# Patient Record
Sex: Male | Born: 1960
Health system: Southern US, Community
[De-identification: ages and names within clinical notes are randomized; demographics above are authoritative.]

## PROBLEM LIST (undated history)

## (undated) DIAGNOSIS — I10 Essential (primary) hypertension: Secondary | ICD-10-CM

## (undated) DIAGNOSIS — I714 Abdominal aortic aneurysm, without rupture, unspecified: Secondary | ICD-10-CM

## (undated) DIAGNOSIS — S81019A Laceration without foreign body, unspecified knee, initial encounter: Secondary | ICD-10-CM

## (undated) DIAGNOSIS — S2249XA Multiple fractures of ribs, unspecified side, initial encounter for closed fracture: Secondary | ICD-10-CM

## (undated) DIAGNOSIS — I509 Heart failure, unspecified: Secondary | ICD-10-CM

## (undated) DIAGNOSIS — I712 Thoracic aortic aneurysm, without rupture, unspecified: Secondary | ICD-10-CM

## (undated) DIAGNOSIS — J189 Pneumonia, unspecified organism: Secondary | ICD-10-CM

## (undated) DIAGNOSIS — S060X9A Concussion with loss of consciousness of unspecified duration, initial encounter: Secondary | ICD-10-CM

## (undated) DIAGNOSIS — S129XXA Fracture of neck, unspecified, initial encounter: Secondary | ICD-10-CM

## (undated) DIAGNOSIS — S22019A Unspecified fracture of first thoracic vertebra, initial encounter for closed fracture: Secondary | ICD-10-CM

## (undated) DIAGNOSIS — S0181XA Laceration without foreign body of other part of head, initial encounter: Secondary | ICD-10-CM

## (undated) DIAGNOSIS — I639 Cerebral infarction, unspecified: Secondary | ICD-10-CM

## (undated) HISTORY — PX: CARPAL TUNNEL RELEASE: SHX101

## (undated) HISTORY — DX: Laceration without foreign body, unspecified knee, initial encounter: S81.019A

## (undated) HISTORY — DX: Fracture of neck, unspecified, initial encounter: S12.9XXA

## (undated) HISTORY — DX: Laceration without foreign body of other part of head, initial encounter: S01.81XA

## (undated) HISTORY — DX: Concussion with loss of consciousness of unspecified duration, initial encounter: S06.0X9A

## (undated) HISTORY — PX: TONSILLECTOMY: SHX5217

## (undated) HISTORY — DX: Thoracic aortic aneurysm, without rupture, unspecified: I71.20

## (undated) HISTORY — DX: Thoracic aortic aneurysm, without rupture: I71.2

## (undated) HISTORY — DX: Multiple fractures of ribs, unspecified side, initial encounter for closed fracture: S22.49XA

## (undated) HISTORY — DX: Unspecified fracture of first thoracic vertebra, initial encounter for closed fracture: S22.019A

## (undated) HISTORY — PX: OTHER SURGICAL HISTORY: SHX169

---

## 1999-05-07 ENCOUNTER — Other Ambulatory Visit (HOSPITAL_COMMUNITY): Admission: RE | Admit: 1999-05-07 | Discharge: 1999-06-18 | Payer: Self-pay | Admitting: Psychiatry

## 2000-09-23 ENCOUNTER — Emergency Department (HOSPITAL_COMMUNITY): Admission: EM | Admit: 2000-09-23 | Discharge: 2000-09-23 | Payer: Self-pay

## 2001-02-17 ENCOUNTER — Ambulatory Visit (HOSPITAL_COMMUNITY): Admission: RE | Admit: 2001-02-17 | Discharge: 2001-02-17 | Payer: Self-pay | Admitting: *Deleted

## 2001-02-18 ENCOUNTER — Encounter: Payer: Self-pay | Admitting: *Deleted

## 2002-11-20 ENCOUNTER — Emergency Department (HOSPITAL_COMMUNITY): Admission: EM | Admit: 2002-11-20 | Discharge: 2002-11-20 | Payer: Self-pay | Admitting: Emergency Medicine

## 2002-11-22 ENCOUNTER — Emergency Department (HOSPITAL_COMMUNITY): Admission: EM | Admit: 2002-11-22 | Discharge: 2002-11-22 | Payer: Self-pay | Admitting: Emergency Medicine

## 2008-03-15 ENCOUNTER — Emergency Department (HOSPITAL_BASED_OUTPATIENT_CLINIC_OR_DEPARTMENT_OTHER): Admission: EM | Admit: 2008-03-15 | Discharge: 2008-03-15 | Payer: Self-pay | Admitting: Emergency Medicine

## 2010-12-21 ENCOUNTER — Emergency Department (HOSPITAL_COMMUNITY): Payer: PRIVATE HEALTH INSURANCE

## 2010-12-21 ENCOUNTER — Encounter (HOSPITAL_COMMUNITY): Payer: Self-pay | Admitting: Radiology

## 2010-12-21 ENCOUNTER — Inpatient Hospital Stay (HOSPITAL_COMMUNITY)
Admission: EM | Admit: 2010-12-21 | Discharge: 2010-12-25 | DRG: 089 | Disposition: A | Discharge status: Home / Self Care | Payer: PRIVATE HEALTH INSURANCE | Attending: Surgery | Admitting: Surgery

## 2010-12-21 DIAGNOSIS — S0100XA Unspecified open wound of scalp, initial encounter: Secondary | ICD-10-CM | POA: Diagnosis present

## 2010-12-21 DIAGNOSIS — I712 Thoracic aortic aneurysm, without rupture, unspecified: Secondary | ICD-10-CM | POA: Diagnosis present

## 2010-12-21 DIAGNOSIS — S2249XA Multiple fractures of ribs, unspecified side, initial encounter for closed fracture: Secondary | ICD-10-CM

## 2010-12-21 DIAGNOSIS — S060X9A Concussion with loss of consciousness of unspecified duration, initial encounter: Secondary | ICD-10-CM

## 2010-12-21 DIAGNOSIS — F1010 Alcohol abuse, uncomplicated: Secondary | ICD-10-CM | POA: Diagnosis present

## 2010-12-21 DIAGNOSIS — S91009A Unspecified open wound, unspecified ankle, initial encounter: Secondary | ICD-10-CM

## 2010-12-21 DIAGNOSIS — F3289 Other specified depressive episodes: Secondary | ICD-10-CM | POA: Diagnosis present

## 2010-12-21 DIAGNOSIS — S81009A Unspecified open wound, unspecified knee, initial encounter: Secondary | ICD-10-CM | POA: Diagnosis present

## 2010-12-21 DIAGNOSIS — S12000A Unspecified displaced fracture of first cervical vertebra, initial encounter for closed fracture: Secondary | ICD-10-CM | POA: Diagnosis present

## 2010-12-21 DIAGNOSIS — S0180XA Unspecified open wound of other part of head, initial encounter: Secondary | ICD-10-CM | POA: Diagnosis present

## 2010-12-21 DIAGNOSIS — F329 Major depressive disorder, single episode, unspecified: Secondary | ICD-10-CM | POA: Diagnosis present

## 2010-12-21 DIAGNOSIS — S060XAA Concussion with loss of consciousness status unknown, initial encounter: Secondary | ICD-10-CM

## 2010-12-21 DIAGNOSIS — S81809A Unspecified open wound, unspecified lower leg, initial encounter: Secondary | ICD-10-CM

## 2010-12-21 DIAGNOSIS — S22009A Unspecified fracture of unspecified thoracic vertebra, initial encounter for closed fracture: Secondary | ICD-10-CM | POA: Diagnosis present

## 2010-12-21 DIAGNOSIS — I10 Essential (primary) hypertension: Secondary | ICD-10-CM | POA: Diagnosis present

## 2010-12-21 HISTORY — DX: Essential (primary) hypertension: I10

## 2010-12-21 HISTORY — PX: OTHER SURGICAL HISTORY: SHX169

## 2010-12-21 LAB — TYPE AND SCREEN
Antibody Screen: NEGATIVE
Unit division: 0
Unit division: 0

## 2010-12-21 LAB — CBC
MCV: 83 fL (ref 78.0–100.0)
Platelets: 218 10*3/uL (ref 150–400)
RDW: 12.5 % (ref 11.5–15.5)
WBC: 7.2 10*3/uL (ref 4.0–10.5)

## 2010-12-21 LAB — COMPREHENSIVE METABOLIC PANEL
Albumin: 3.9 g/dL (ref 3.5–5.2)
BUN: 11 mg/dL (ref 6–23)
CO2: 26 mEq/L (ref 19–32)
Chloride: 102 mEq/L (ref 96–112)
Creatinine, Ser: 1.12 mg/dL (ref 0.50–1.35)
GFR calc non Af Amer: >60 mL/min (ref >60)
Total Bilirubin: 0.7 mg/dL (ref 0.3–1.2)

## 2010-12-21 LAB — ETHANOL: Alcohol, Ethyl (B): 87 mg/dL — ABNORMAL HIGH (ref 0–11)

## 2010-12-21 LAB — ABO/RH: ABO/RH(D): B POS

## 2010-12-21 LAB — POCT I-STAT, CHEM 8
BUN: 11 mg/dL (ref 6–23)
Calcium, Ion: 1.11 mmol/L — ABNORMAL LOW (ref 1.12–1.32)
Hemoglobin: 15 g/dL (ref 13.0–17.0)
TCO2: 25 mmol/L (ref 0–100)

## 2010-12-21 LAB — MRSA PCR SCREENING: MRSA by PCR: NEGATIVE

## 2010-12-21 MED ORDER — IOHEXOL 300 MG/ML  SOLN
120.0000 mL | Freq: Once | INTRAMUSCULAR | Status: AC | PRN
  Administered 2010-12-21: 120 mL via INTRAVENOUS

## 2010-12-22 ENCOUNTER — Inpatient Hospital Stay (HOSPITAL_COMMUNITY): Payer: PRIVATE HEALTH INSURANCE

## 2010-12-22 LAB — CBC
MCHC: 33.1 g/dL (ref 30.0–36.0)
Platelets: 152 10*3/uL (ref 150–400)
RDW: 13 % (ref 11.5–15.5)

## 2010-12-22 LAB — BASIC METABOLIC PANEL
GFR calc Af Amer: >60 mL/min (ref >60)
GFR calc non Af Amer: >60 mL/min (ref >60)
Potassium: 3.5 mEq/L (ref 3.5–5.1)
Sodium: 138 mEq/L (ref 135–145)

## 2010-12-23 LAB — BASIC METABOLIC PANEL
Chloride: 103 mEq/L (ref 96–112)
GFR calc Af Amer: >60 mL/min (ref >60)
Potassium: 3.8 mEq/L (ref 3.5–5.1)
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
## 2011-01-02 ENCOUNTER — Encounter (INDEPENDENT_AMBULATORY_CARE_PROVIDER_SITE_OTHER): Payer: Self-pay
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
## 2011-01-07 ENCOUNTER — Encounter: Payer: Self-pay | Admitting: Surgery

## 2011-01-08 ENCOUNTER — Telehealth (INDEPENDENT_AMBULATORY_CARE_PROVIDER_SITE_OTHER): Payer: Self-pay | Admitting: Orthopedic Surgery

## 2011-01-08 NOTE — Telephone Encounter (Signed)
Pt called to reschedule appointment. Told him to come 01/09/11 at 2:00.

## 2011-01-09 ENCOUNTER — Encounter (INDEPENDENT_AMBULATORY_CARE_PROVIDER_SITE_OTHER): Payer: Self-pay

## 2011-01-09 ENCOUNTER — Ambulatory Visit (INDEPENDENT_AMBULATORY_CARE_PROVIDER_SITE_OTHER): Payer: Self-pay | Admitting: Orthopedic Surgery

## 2011-01-09 DIAGNOSIS — S81012A Laceration without foreign body, left knee, initial encounter: Secondary | ICD-10-CM

## 2011-01-09 DIAGNOSIS — S0191XA Laceration without foreign body of unspecified part of head, initial encounter: Secondary | ICD-10-CM

## 2011-01-09 DIAGNOSIS — S81009A Unspecified open wound, unspecified knee, initial encounter: Secondary | ICD-10-CM

## 2011-01-09 DIAGNOSIS — S0180XA Unspecified open wound of other part of head, initial encounter: Secondary | ICD-10-CM

## 2011-01-09 MED ORDER — DOXYCYCLINE HYCLATE 100 MG PO TABS: 100.0000 mg | ORAL_TABLET | Freq: Two times a day (BID) | ORAL | Status: AC
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
## 2011-01-09 NOTE — Patient Instructions (Signed)
Sunscreen (>35 SPF) to scars daily for 1 year.  May use Mederma if desired.  After six months, if not happy with appearance, we can arrange evaluation by a plastic surgeon for a scar revision.   Call next week if painful part of facial wound is not improved.
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
## 2011-02-03 DIAGNOSIS — S129XXA Fracture of neck, unspecified, initial encounter: Secondary | ICD-10-CM | POA: Insufficient documentation

## 2011-02-03 DIAGNOSIS — S22019A Unspecified fracture of first thoracic vertebra, initial encounter for closed fracture: Secondary | ICD-10-CM

## 2011-02-03 DIAGNOSIS — S060X9A Concussion with loss of consciousness of unspecified duration, initial encounter: Secondary | ICD-10-CM

## 2011-02-03 DIAGNOSIS — S0181XA Laceration without foreign body of other part of head, initial encounter: Secondary | ICD-10-CM

## 2011-02-03 DIAGNOSIS — S2249XA Multiple fractures of ribs, unspecified side, initial encounter for closed fracture: Secondary | ICD-10-CM | POA: Insufficient documentation

## 2011-02-03 DIAGNOSIS — S81019A Laceration without foreign body, unspecified knee, initial encounter: Secondary | ICD-10-CM | POA: Insufficient documentation

## 2011-02-03 DIAGNOSIS — I712 Thoracic aortic aneurysm, without rupture: Secondary | ICD-10-CM | POA: Insufficient documentation

## 2011-02-03 DIAGNOSIS — I10 Essential (primary) hypertension: Secondary | ICD-10-CM | POA: Insufficient documentation

## 2011-02-04 ENCOUNTER — Institutional Professional Consult (permissible substitution) (INDEPENDENT_AMBULATORY_CARE_PROVIDER_SITE_OTHER): Payer: Self-pay | Admitting: Surgery

## 2011-02-04 ENCOUNTER — Encounter: Payer: Self-pay | Admitting: Surgery

## 2011-02-04 VITALS — BP 113/80 | HR 91 | Resp 20 | Ht 75.0 in | Wt 265.0 lb

## 2011-02-04 DIAGNOSIS — IMO0001 Reserved for inherently not codable concepts without codable children: Secondary | ICD-10-CM

## 2011-02-04 DIAGNOSIS — I712 Thoracic aortic aneurysm, without rupture: Secondary | ICD-10-CM

## 2011-02-04 MED ORDER — METOPROLOL SUCCINATE ER 25 MG PO TB24: 25.0000 mg | ORAL_TABLET | Freq: Every day | ORAL | Status: DC
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
## 2011-12-22 ENCOUNTER — Other Ambulatory Visit: Payer: Self-pay | Admitting: Surgery

## 2011-12-22 DIAGNOSIS — I712 Thoracic aortic aneurysm, without rupture: Secondary | ICD-10-CM

## 2011-12-24 ENCOUNTER — Other Ambulatory Visit: Payer: Self-pay | Admitting: Surgery

## 2011-12-24 DIAGNOSIS — I712 Thoracic aortic aneurysm, without rupture: Secondary | ICD-10-CM

## 2012-01-19 ENCOUNTER — Other Ambulatory Visit: Payer: PRIVATE HEALTH INSURANCE

## 2012-01-20 ENCOUNTER — Ambulatory Visit: Payer: PRIVATE HEALTH INSURANCE | Admitting: Surgery

## 2012-02-03 ENCOUNTER — Ambulatory Visit: Payer: PRIVATE HEALTH INSURANCE | Admitting: Surgery

## 2012-11-17 ENCOUNTER — Emergency Department (HOSPITAL_COMMUNITY): Payer: PRIVATE HEALTH INSURANCE

## 2012-11-17 ENCOUNTER — Emergency Department (HOSPITAL_COMMUNITY)
Admission: EM | Admit: 2012-11-17 | Discharge: 2012-11-17 | Disposition: A | Discharge status: Home / Self Care | Payer: PRIVATE HEALTH INSURANCE | Attending: Emergency Medicine | Admitting: Emergency Medicine

## 2012-11-17 ENCOUNTER — Encounter (HOSPITAL_COMMUNITY): Payer: Self-pay | Admitting: *Deleted

## 2012-11-17 DIAGNOSIS — R4182 Altered mental status, unspecified: Secondary | ICD-10-CM

## 2012-11-17 DIAGNOSIS — Z8781 Personal history of (healed) traumatic fracture: Secondary | ICD-10-CM | POA: Insufficient documentation

## 2012-11-17 DIAGNOSIS — F329 Major depressive disorder, single episode, unspecified: Secondary | ICD-10-CM | POA: Insufficient documentation

## 2012-11-17 DIAGNOSIS — Z87828 Personal history of other (healed) physical injury and trauma: Secondary | ICD-10-CM | POA: Insufficient documentation

## 2012-11-17 DIAGNOSIS — Z79899 Other long term (current) drug therapy: Secondary | ICD-10-CM | POA: Insufficient documentation

## 2012-11-17 DIAGNOSIS — I10 Essential (primary) hypertension: Secondary | ICD-10-CM | POA: Insufficient documentation

## 2012-11-17 DIAGNOSIS — Z8679 Personal history of other diseases of the circulatory system: Secondary | ICD-10-CM | POA: Insufficient documentation

## 2012-11-17 DIAGNOSIS — F3289 Other specified depressive episodes: Secondary | ICD-10-CM | POA: Insufficient documentation

## 2012-11-17 DIAGNOSIS — Z87820 Personal history of traumatic brain injury: Secondary | ICD-10-CM | POA: Insufficient documentation

## 2012-11-17 LAB — POCT I-STAT, CHEM 8
BUN: 14 mg/dL (ref 6–23)
Calcium, Ion: 1.15 mmol/L (ref 1.12–1.23)
Chloride: 106 mEq/L (ref 96–112)
Creatinine, Ser: 1.3 mg/dL (ref 0.50–1.35)
Glucose, Bld: 113 mg/dL — ABNORMAL HIGH (ref 70–99)
HCT: 42 % (ref 39.0–52.0)
Potassium: 3.2 mEq/L — ABNORMAL LOW (ref 3.5–5.1)
Sodium: 142 mEq/L (ref 135–145)

## 2012-11-17 MED ORDER — ONDANSETRON HCL 4 MG/2ML IJ SOLN
INTRAMUSCULAR | Status: AC
  Administered 2012-11-17: 4 mg
  Filled 2012-11-17: qty 2

## 2012-11-17 NOTE — ED Notes (Signed)
Pt discharged home with bus pass. Has no further questions at the time. Vital signs stable.

## 2012-11-17 NOTE — ED Notes (Signed)
Patient transported to CT 

## 2012-11-17 NOTE — ED Notes (Signed)
Pt given [place] sandwich and drink. He is alert and oriented x4. Vital signs stable. To be discharged home.
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
## 2012-11-17 NOTE — ED Notes (Signed)
Pt returned from CT °

## 2012-11-17 NOTE — ED Notes (Signed)
MD at bedside. 
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
## 2015-06-10 DIAGNOSIS — I639 Cerebral infarction, unspecified: Secondary | ICD-10-CM

## 2015-06-10 HISTORY — DX: Cerebral infarction, unspecified: I63.9

## 2016-12-23 ENCOUNTER — Inpatient Hospital Stay (HOSPITAL_COMMUNITY)
Admission: EM | Admit: 2016-12-23 | Discharge: 2016-12-25 | DRG: 065 | Disposition: A | Discharge status: Home / Self Care | Payer: Self-pay | Attending: Internal Medicine | Admitting: Internal Medicine

## 2016-12-23 ENCOUNTER — Emergency Department (HOSPITAL_COMMUNITY): Payer: Self-pay

## 2016-12-23 ENCOUNTER — Encounter (HOSPITAL_COMMUNITY): Payer: Self-pay | Admitting: Family Medicine

## 2016-12-23 DIAGNOSIS — I482 Chronic atrial fibrillation: Secondary | ICD-10-CM | POA: Diagnosis present

## 2016-12-23 DIAGNOSIS — I639 Cerebral infarction, unspecified: Principal | ICD-10-CM | POA: Diagnosis present

## 2016-12-23 DIAGNOSIS — E785 Hyperlipidemia, unspecified: Secondary | ICD-10-CM | POA: Diagnosis present

## 2016-12-23 DIAGNOSIS — Q231 Congenital insufficiency of aortic valve: Secondary | ICD-10-CM

## 2016-12-23 DIAGNOSIS — I10 Essential (primary) hypertension: Secondary | ICD-10-CM | POA: Diagnosis present

## 2016-12-23 DIAGNOSIS — I712 Thoracic aortic aneurysm, without rupture: Secondary | ICD-10-CM | POA: Diagnosis present

## 2016-12-23 DIAGNOSIS — R29702 NIHSS score 2: Secondary | ICD-10-CM | POA: Diagnosis present

## 2016-12-23 DIAGNOSIS — R4702 Dysphasia: Secondary | ICD-10-CM | POA: Diagnosis present

## 2016-12-23 DIAGNOSIS — I69320 Aphasia following cerebral infarction: Secondary | ICD-10-CM

## 2016-12-23 DIAGNOSIS — I110 Hypertensive heart disease with heart failure: Secondary | ICD-10-CM | POA: Diagnosis present

## 2016-12-23 DIAGNOSIS — I7121 Aneurysm of the ascending aorta, without rupture: Secondary | ICD-10-CM | POA: Diagnosis present

## 2016-12-23 DIAGNOSIS — Z7901 Long term (current) use of anticoagulants: Secondary | ICD-10-CM

## 2016-12-23 DIAGNOSIS — Z833 Family history of diabetes mellitus: Secondary | ICD-10-CM

## 2016-12-23 DIAGNOSIS — I5042 Chronic combined systolic (congestive) and diastolic (congestive) heart failure: Secondary | ICD-10-CM

## 2016-12-23 DIAGNOSIS — Z8249 Family history of ischemic heart disease and other diseases of the circulatory system: Secondary | ICD-10-CM

## 2016-12-23 DIAGNOSIS — I481 Persistent atrial fibrillation: Secondary | ICD-10-CM | POA: Diagnosis present

## 2016-12-23 DIAGNOSIS — I420 Dilated cardiomyopathy: Secondary | ICD-10-CM | POA: Diagnosis present

## 2016-12-23 DIAGNOSIS — G8321 Monoplegia of upper limb affecting right dominant side: Secondary | ICD-10-CM | POA: Diagnosis present

## 2016-12-23 DIAGNOSIS — Z79899 Other long term (current) drug therapy: Secondary | ICD-10-CM

## 2016-12-23 DIAGNOSIS — I509 Heart failure, unspecified: Secondary | ICD-10-CM | POA: Diagnosis present

## 2016-12-23 DIAGNOSIS — E876 Hypokalemia: Secondary | ICD-10-CM | POA: Diagnosis present

## 2016-12-23 DIAGNOSIS — I4819 Other persistent atrial fibrillation: Secondary | ICD-10-CM | POA: Diagnosis present

## 2016-12-23 DIAGNOSIS — I5022 Chronic systolic (congestive) heart failure: Secondary | ICD-10-CM

## 2016-12-23 DIAGNOSIS — F909 Attention-deficit hyperactivity disorder, unspecified type: Secondary | ICD-10-CM | POA: Diagnosis present

## 2016-12-23 HISTORY — DX: Cerebral infarction, unspecified: I63.9

## 2016-12-23 HISTORY — DX: Heart failure, unspecified: I50.9

## 2016-12-23 HISTORY — DX: Pneumonia, unspecified organism: J18.9

## 2016-12-23 LAB — COMPREHENSIVE METABOLIC PANEL
ALK PHOS: 48 U/L (ref 38–126)
ALT: 40 U/L (ref 17–63)
AST: 33 U/L (ref 15–41)
Albumin: 4.1 g/dL (ref 3.5–5.0)
Anion gap: 8 (ref 5–15)
BUN: 18 mg/dL (ref 6–20)
CO2: 28 mmol/L (ref 22–32)
CREATININE: 1.08 mg/dL (ref 0.61–1.24)
Calcium: 9.2 mg/dL (ref 8.9–10.3)
Chloride: 105 mmol/L (ref 101–111)
GFR calc non Af Amer: >60 mL/min (ref >60)
Glucose, Bld: 93 mg/dL (ref 65–99)
Potassium: 3.7 mmol/L (ref 3.5–5.1)
SODIUM: 141 mmol/L (ref 135–145)
Total Bilirubin: 1.5 mg/dL — ABNORMAL HIGH (ref 0.3–1.2)
Total Protein: 6.8 g/dL (ref 6.5–8.1)

## 2016-12-23 LAB — CBC
HEMATOCRIT: 43.3 % (ref 39.0–52.0)
Hemoglobin: 15.1 g/dL (ref 13.0–17.0)
MCH: 28.6 pg (ref 26.0–34.0)
MCHC: 34.9 g/dL (ref 30.0–36.0)
MCV: 82 fL (ref 78.0–100.0)
PLATELETS: 192 10*3/uL (ref 150–400)
RBC: 5.28 MIL/uL (ref 4.22–5.81)
RDW: 13.3 % (ref 11.5–15.5)
WBC: 6.8 10*3/uL (ref 4.0–10.5)

## 2016-12-23 LAB — RAPID URINE DRUG SCREEN, HOSP PERFORMED
AMPHETAMINES: NOT DETECTED
Barbiturates: NOT DETECTED
Benzodiazepines: NOT DETECTED
Cocaine: NOT DETECTED
OPIATES: NOT DETECTED
Tetrahydrocannabinol: POSITIVE — AB

## 2016-12-23 LAB — URINALYSIS, ROUTINE W REFLEX MICROSCOPIC
BACTERIA UA: NONE SEEN
BILIRUBIN URINE: NEGATIVE
GLUCOSE, UA: NEGATIVE mg/dL
HGB URINE DIPSTICK: NEGATIVE
KETONES UR: NEGATIVE mg/dL
NITRITE: NEGATIVE
Protein, ur: 30 mg/dL — AB
Specific Gravity, Urine: 1.026 (ref 1.005–1.030)
pH: 8 (ref 5.0–8.0)

## 2016-12-23 LAB — DIFFERENTIAL
Basophils Absolute: 0.1 10*3/uL (ref 0.0–0.1)
Basophils Relative: 1 %
Eosinophils Absolute: 0.2 10*3/uL (ref 0.0–0.7)
Eosinophils Relative: 3 %
LYMPHS PCT: 48 %
Lymphs Abs: 3.3 10*3/uL (ref 0.7–4.0)
MONO ABS: 0.5 10*3/uL (ref 0.1–1.0)
MONOS PCT: 8 %
NEUTROS ABS: 2.7 10*3/uL (ref 1.7–7.7)
Neutrophils Relative %: 40 %

## 2016-12-23 LAB — PROTIME-INR
INR: 1.2
Prothrombin Time: 15.3 seconds — ABNORMAL HIGH (ref 11.4–15.2)

## 2016-12-23 LAB — APTT: aPTT: 31 seconds (ref 24–36)

## 2016-12-23 MED ORDER — ASPIRIN 81 MG PO CHEW
324.0000 mg | CHEWABLE_TABLET | Freq: Once | ORAL | Status: AC
  Administered 2016-12-24: 324 mg via ORAL
  Filled 2016-12-23: qty 4

## 2016-12-23 NOTE — ED Triage Notes (Signed)
Patient reports he started experiencing right arm numbness and was unable to speak at 19:20 this afternoon while watching television. During triage, pt is able to speak but reports his speech is slurred and is able to move right arm.

## 2016-12-24 ENCOUNTER — Observation Stay (HOSPITAL_COMMUNITY): Payer: Self-pay

## 2016-12-24 ENCOUNTER — Observation Stay (HOSPITAL_COMMUNITY): Payer: PRIVATE HEALTH INSURANCE

## 2016-12-24 ENCOUNTER — Inpatient Hospital Stay (HOSPITAL_COMMUNITY): Payer: Self-pay

## 2016-12-24 ENCOUNTER — Encounter (HOSPITAL_COMMUNITY): Payer: Self-pay | Admitting: Internal Medicine

## 2016-12-24 DIAGNOSIS — I63412 Cerebral infarction due to embolism of left middle cerebral artery: Secondary | ICD-10-CM

## 2016-12-24 DIAGNOSIS — I639 Cerebral infarction, unspecified: Secondary | ICD-10-CM

## 2016-12-24 DIAGNOSIS — I4819 Other persistent atrial fibrillation: Secondary | ICD-10-CM | POA: Diagnosis present

## 2016-12-24 DIAGNOSIS — I481 Persistent atrial fibrillation: Secondary | ICD-10-CM

## 2016-12-24 LAB — COMPREHENSIVE METABOLIC PANEL
ALBUMIN: 3.6 g/dL (ref 3.5–5.0)
ALT: 35 U/L (ref 17–63)
ANION GAP: 6 (ref 5–15)
AST: 30 U/L (ref 15–41)
Alkaline Phosphatase: 44 U/L (ref 38–126)
BUN: 14 mg/dL (ref 6–20)
CHLORIDE: 106 mmol/L (ref 101–111)
CO2: 26 mmol/L (ref 22–32)
Calcium: 8.8 mg/dL — ABNORMAL LOW (ref 8.9–10.3)
Creatinine, Ser: 0.99 mg/dL (ref 0.61–1.24)
GFR calc non Af Amer: >60 mL/min (ref >60)
GLUCOSE: 91 mg/dL (ref 65–99)
POTASSIUM: 3.4 mmol/L — AB (ref 3.5–5.1)
SODIUM: 138 mmol/L (ref 135–145)
Total Bilirubin: 1.3 mg/dL — ABNORMAL HIGH (ref 0.3–1.2)
Total Protein: 5.9 g/dL — ABNORMAL LOW (ref 6.5–8.1)

## 2016-12-24 LAB — DIGOXIN LEVEL: Digoxin Level: <0.2 ng/mL — ABNORMAL LOW (ref 0.8–2.0)

## 2016-12-24 LAB — CBC
HCT: 42.5 % (ref 39.0–52.0)
Hemoglobin: 14 g/dL (ref 13.0–17.0)
MCH: 27.7 pg (ref 26.0–34.0)
MCHC: 32.9 g/dL (ref 30.0–36.0)
MCV: 84 fL (ref 78.0–100.0)
PLATELETS: 184 10*3/uL (ref 150–400)
RBC: 5.06 MIL/uL (ref 4.22–5.81)
RDW: 13.8 % (ref 11.5–15.5)
WBC: 5.9 10*3/uL (ref 4.0–10.5)

## 2016-12-24 LAB — LIPID PANEL
CHOL/HDL RATIO: 5.1 ratio
CHOLESTEROL: 157 mg/dL (ref 0–200)
HDL: 31 mg/dL — AB (ref >40)
LDL CALC: 102 mg/dL — AB (ref 0–99)
TRIGLYCERIDES: 120 mg/dL (ref <150)
VLDL: 24 mg/dL (ref 0–40)

## 2016-12-24 MED ORDER — LOSARTAN POTASSIUM 25 MG PO TABS: 12.5000 mg | ORAL_TABLET | Freq: Every day | ORAL | Status: DC

## 2016-12-24 MED ORDER — ACETAMINOPHEN 325 MG PO TABS: 650.0000 mg | ORAL_TABLET | ORAL | Status: DC | PRN

## 2016-12-24 MED ORDER — DIGOXIN 125 MCG PO TABS: 0.1250 mg | ORAL_TABLET | Freq: Every day | ORAL | Status: DC

## 2016-12-24 MED ORDER — ZOLPIDEM TARTRATE 5 MG PO TABS
5.0000 mg | ORAL_TABLET | Freq: Once | ORAL | Status: AC
  Administered 2016-12-24: 5 mg via ORAL
  Filled 2016-12-24: qty 1

## 2016-12-24 MED ORDER — DILTIAZEM HCL 30 MG PO TABS
60.0000 mg | ORAL_TABLET | Freq: Four times a day (QID) | ORAL | Status: DC
  Administered 2016-12-24 – 2016-12-25 (×6): 60 mg via ORAL
  Filled 2016-12-24 (×6): qty 2

## 2016-12-24 MED ORDER — SODIUM CHLORIDE 0.9 % IV SOLN
INTRAVENOUS | Status: DC
  Administered 2016-12-24: 02:00:00 via INTRAVENOUS

## 2016-12-24 MED ORDER — ATORVASTATIN CALCIUM 40 MG PO TABS
40.0000 mg | ORAL_TABLET | Freq: Every day | ORAL | Status: DC
  Administered 2016-12-24 – 2016-12-25 (×2): 40 mg via ORAL
  Filled 2016-12-24 (×2): qty 1

## 2016-12-24 MED ORDER — CARVEDILOL 3.125 MG PO TABS: 3.1250 mg | ORAL_TABLET | Freq: Two times a day (BID) | ORAL | Status: DC

## 2016-12-24 MED ORDER — ASPIRIN EC 81 MG PO TBEC
81.0000 mg | DELAYED_RELEASE_TABLET | Freq: Every day | ORAL | Status: DC
  Administered 2016-12-25: 81 mg via ORAL
  Filled 2016-12-24: qty 1

## 2016-12-24 MED ORDER — STROKE: EARLY STAGES OF RECOVERY BOOK
Freq: Once | Status: AC
  Administered 2016-12-24: 02:00:00

## 2016-12-24 MED ORDER — APIXABAN 5 MG PO TABS
5.0000 mg | ORAL_TABLET | Freq: Two times a day (BID) | ORAL | Status: DC
  Administered 2016-12-24 – 2016-12-25 (×4): 5 mg via ORAL
  Filled 2016-12-24 (×4): qty 1

## 2016-12-24 MED ORDER — ACETAMINOPHEN 160 MG/5ML PO SOLN: 650.0000 mg | ORAL | Status: DC | PRN

## 2016-12-24 MED ORDER — IOPAMIDOL (ISOVUE-370) INJECTION 76%
INTRAVENOUS | Status: AC
  Administered 2016-12-24: 50 mL
  Filled 2016-12-24: qty 50

## 2016-12-24 MED ORDER — ACETAMINOPHEN 650 MG RE SUPP: 650.0000 mg | RECTAL | Status: DC | PRN

## 2016-12-24 MED ORDER — MAGNESIUM OXIDE 400 (241.3 MG) MG PO TABS
1000.0000 mg | ORAL_TABLET | Freq: Every day | ORAL | Status: DC
  Administered 2016-12-24 – 2016-12-25 (×2): 1000 mg via ORAL
  Filled 2016-12-24 (×2): qty 3
  Filled 2016-12-24: qty 5
  Filled 2016-12-24: qty 2.5

## 2016-12-24 MED ORDER — POTASSIUM CHLORIDE CRYS ER 20 MEQ PO TBCR
40.0000 meq | EXTENDED_RELEASE_TABLET | Freq: Once | ORAL | Status: AC
  Administered 2016-12-24: 40 meq via ORAL
  Filled 2016-12-24: qty 2
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
## 2016-12-24 NOTE — Progress Notes (Signed)
**  Preliminary report by tech**  Carotid artery duplex complete. Findings are consistent with a 1-39 percent stenosis involving the right internal carotid artery and the left internal carotid artery. The vertebral arteries demonstrate antegrade flow.  12/24/16 1:27 PM Olen CordialGreg Lorrane Mccay RVT

## 2016-12-24 NOTE — ED Notes (Signed)
[facility] contacted
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
## 2016-12-24 NOTE — Progress Notes (Signed)
PT Cancellation Note  Patient Details Name: Caleb [name] MRN: [id number] DOB: 07/21/1960   Cancelled Treatment:     Pt currently at MRI. Will check back later.  Gardiner RamusMichael Kelyn Ponciano, [place]PTA Office #[phone]   Gardiner RamusMichael Mercedes Fort 12/24/2016, 9:39 AM
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
## 2016-12-24 NOTE — Progress Notes (Signed)
Davonna BellingSamir M Algeo is a 56 y.o. male with history of atrial fibrillation on Apixaban, history of stroke, hypertension, thoracic aortic aneurysm presents to the ER with complaints of right upper extremity weakness and difficulty speaking. His symptoms have improved and he was admitted earlier today by DR Toniann FailKakrakandy for evaluation and management of stroke.   Neurology consulted and recommendations given.  Stroke work up in progress.   Kathlen ModyVijaya Hephzibah Strehle, MD [phone]
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
## 2016-12-24 NOTE — Progress Notes (Signed)
Patient arrived via [facility], vitals stable, tele applied and verified.  Oriented to room/unit. Admission completed.  Pt requesting sleep aid, MD on call paged, orders for ambien 5mg  x1.  Questions answered. Continue to monitor patient.

## 2016-12-25 ENCOUNTER — Inpatient Hospital Stay (HOSPITAL_COMMUNITY): Payer: Self-pay

## 2016-12-25 DIAGNOSIS — I351 Nonrheumatic aortic (valve) insufficiency: Secondary | ICD-10-CM

## 2016-12-25 DIAGNOSIS — I5042 Chronic combined systolic (congestive) and diastolic (congestive) heart failure: Secondary | ICD-10-CM

## 2016-12-25 DIAGNOSIS — I509 Heart failure, unspecified: Secondary | ICD-10-CM

## 2016-12-25 DIAGNOSIS — I482 Chronic atrial fibrillation: Secondary | ICD-10-CM

## 2016-12-25 DIAGNOSIS — I10 Essential (primary) hypertension: Secondary | ICD-10-CM

## 2016-12-25 DIAGNOSIS — I5022 Chronic systolic (congestive) heart failure: Secondary | ICD-10-CM

## 2016-12-25 DIAGNOSIS — I429 Cardiomyopathy, unspecified: Secondary | ICD-10-CM

## 2016-12-25 LAB — VAS US CAROTID
LCCAPSYS: 67 cm/s
LEFT ECA DIAS: -11 cm/s
LEFT VERTEBRAL DIAS: -4 cm/s
LICADDIAS: -17 cm/s
LICAPSYS: -32 cm/s
Left CCA dist dias: -16 cm/s
Left CCA dist sys: -39 cm/s
Left CCA prox dias: 15 cm/s
Left ICA dist sys: -36 cm/s
Left ICA prox dias: -13 cm/s
RCCAPDIAS: -12 cm/s
RIGHT ECA DIAS: -6 cm/s
RIGHT VERTEBRAL DIAS: -10 cm/s
Right CCA prox sys: -53 cm/s
Right cca dist sys: -43 cm/s

## 2016-12-25 LAB — HEMOGLOBIN A1C
Hgb A1c MFr Bld: 5.4 % (ref 4.8–5.6)
Mean Plasma Glucose: 108 mg/dL

## 2016-12-25 LAB — HIV ANTIBODY (ROUTINE TESTING W REFLEX): HIV Screen 4th Generation wRfx: NONREACTIVE

## 2016-12-25 LAB — HEPARIN ANTI-XA: Heparin LMW: 1.51 IU/mL

## 2016-12-25 LAB — ECHOCARDIOGRAM COMPLETE
Height: 75 in
Weight: 2036.8 oz

## 2016-12-25 MED ORDER — POTASSIUM CHLORIDE CRYS ER 20 MEQ PO TBCR: 20.0000 meq | EXTENDED_RELEASE_TABLET | Freq: Every day | ORAL | 0 refills | Status: DC

## 2016-12-25 MED ORDER — LOSARTAN POTASSIUM 25 MG PO TABS: 25.0000 mg | ORAL_TABLET | Freq: Every day | ORAL | 1 refills | Status: DC

## 2016-12-25 MED ORDER — DIGOXIN 125 MCG PO TABS: 0.1250 mg | ORAL_TABLET | Freq: Every day | ORAL | 1 refills | Status: DC

## 2016-12-25 MED ORDER — ASPIRIN 81 MG PO TBEC: 81.0000 mg | DELAYED_RELEASE_TABLET | Freq: Every day | ORAL | 1 refills | Status: DC

## 2016-12-25 MED ORDER — DIGOXIN 125 MCG PO TABS
0.2500 mg | ORAL_TABLET | Freq: Every day | ORAL | Status: DC
  Administered 2016-12-25: 0.25 mg via ORAL
  Filled 2016-12-25: qty 2

## 2016-12-25 MED ORDER — CARVEDILOL 3.125 MG PO TABS: 3.1250 mg | ORAL_TABLET | Freq: Two times a day (BID) | ORAL | 1 refills | Status: DC

## 2016-12-25 MED ORDER — ATORVASTATIN CALCIUM 40 MG PO TABS: 40.0000 mg | ORAL_TABLET | Freq: Every day | ORAL | 1 refills | Status: DC

## 2016-12-25 MED ORDER — FUROSEMIDE 40 MG PO TABS: 40.0000 mg | ORAL_TABLET | Freq: Every day | ORAL | 1 refills | Status: DC

## 2016-12-25 MED ORDER — CARVEDILOL 6.25 MG PO TABS
6.2500 mg | ORAL_TABLET | Freq: Two times a day (BID) | ORAL | Status: DC
  Administered 2016-12-25: 6.25 mg via ORAL
  Filled 2016-12-25: qty 1

## 2016-12-25 NOTE — Progress Notes (Signed)
PROGRESS NOTE    Caleb [name]  ZOX:[id number] DOB: [birth year] DOA: 12/23/2016 PCP: System, Pcp Not In   Brief Narrative: Caleb [name] a 56 y.o.malewith history of atrial fibrillation on Apixaban, history of stroke, hypertension, thoracic aortic aneurysm presents to the ER with complaints of right upper extremity weakness and difficulty speaking. MRI brain shows  Three tiny acute cortical infarcts in the bilateral frontal and left parietal lobes, -bilateral ACA and left MCA territories. Neurology consulted and recommendations to add aspirin to eliquis. Echocardiogram was abnormal with 20% EF and diffuse hypokinesis. Cardiology consulted to see if he needs TEE vs Cardiac catheterization.   Assessment & Plan:   Principal Problem:   Acute ischemic stroke [facility]) Active Problems:   Hypertension   Aortic aneurysm, thoracic (HCC)   Persistent atrial fibrillation (HCC)   Stroke (cerebrum) (HCC)   Stroke (HCC)   Acute ischemic stroke in the bilateral frontal and left parietal lobes and remote infarct.  Pt reports compliance to eliquis.  Stroke work up in progress.  Aspirin 81 mg added to his regimen.  Carotid US does not show sig stenosis. And CT neck, no abnormalities.  LDL is 102 and hgba1c is 5.4 Cardiology consulted for the abnormal echocardiogram.    Hypertension:  Elevated diastolic bp parameter   Hypokalemia  Replaced.    Chronic atrial fibrillation:  On eliquis for anticoagulation. And cardizem for rate control.  Usually follows up with a cardiologist at [place].     DVT prophylaxis: eliquis.  Code Status: full code.  Family Communication: discussed with patient, no family at bedside.  Disposition Plan: pending cardiology consultation.    Consultants:   Neurology  Cardiology.    Procedures: echocardiogram showing. Moderate LVH, EF is 20%, diffuse hypokinesis, cannot exclude apical thrombus, mod aortic regurgitation, AA severely dilated. Severe dilatation of  the right atrium.   Antimicrobials: none.   Subjective: Denies any new complaints.  Speech not back to baseline.   Objective: Vitals:   12/24/16 2124 12/25/16 0544 12/25/16 1017 12/25/16 1210  BP: (!) 130/96 (!) 132/99  (!) 127/97  Pulse: 84 87  93  Resp: 18 20  20   Temp: 98.1 F (36.7 C) (!) 97.5 F (36.4 C)  98.1 F (36.7 C)  TempSrc: Oral Oral  Oral  SpO2: 98% 97% 98% 98%  Weight:      Height:        Intake/Output Summary (Last 24 hours) at 12/25/16 1554 Last data filed at 12/25/16 1250  Gross per 24 hour  Intake             1040 ml  Output                0 ml  Net             1040 ml   Filed Weights   12/23/16 1950 12/24/16 0106  Weight: 124.3 kg (274 lb) 57.7 kg (127 lb 4.8 oz)    Examination:  General exam: Appears calm and comfortable  Respiratory system: Clear to auscultation. Respiratory effort normal. Cardiovascular system: S1 & S2 heard, irregular No JVD, murmurs, rubs, gallops or clicks. No pedal edema. Gastrointestinal system: Abdomen is nondistended, soft and nontender. No organomegaly or masses felt. Normal bowel sounds heard. Central nervous system: Alert and oriented. Speech still not back to baseline as per the patient.  Extremities: Symmetric 5 x 5 power. Skin: No rashes, lesions or ulcers Psychiatry: Judgement and insight appear normal. Mood & affect appropriate.  Data Reviewed: I have personally reviewed following labs and imaging studies  CBC:  Recent Labs Lab 12/23/16 2032 12/24/16 0113  WBC 6.8 5.9  NEUTROABS 2.7  --   HGB 15.1 14.0  HCT 43.3 42.5  MCV 82.0 84.0  PLT 192 184   Basic Metabolic Panel:  Recent Labs Lab 12/23/16 2032 12/24/16 0113  NA 141 138  K 3.7 3.4*  CL 105 106  CO2 28 26  GLUCOSE 93 91  BUN 18 14  CREATININE 1.08 0.99  CALCIUM 9.2 8.8*   GFR: Estimated Creatinine Clearance: 67.2 mL/min (by C-G formula based on SCr of 0.99 mg/dL). Liver Function Tests:  Recent Labs Lab 12/23/16 2032  12/24/16 0113  AST 33 30  ALT 40 35  ALKPHOS 48 44  BILITOT 1.5* 1.3*  PROT 6.8 5.9*  ALBUMIN 4.1 3.6   No results for input(s): LIPASE, AMYLASE in the last 168 hours. No results for input(s): AMMONIA in the last 168 hours. Coagulation Profile:  Recent Labs Lab 12/23/16 2032  INR 1.20   Cardiac Enzymes: No results for input(s): CKTOTAL, CKMB, CKMBINDEX, TROPONINI in the last 168 hours. BNP (last 3 results) No results for input(s): PROBNP in the last 8760 hours. HbA1C:  Recent Labs  12/24/16 0113  HGBA1C 5.4   CBG: No results for input(s): GLUCAP in the last 168 hours. Lipid Profile:  Recent Labs  12/24/16 0113  CHOL 157  HDL 31*  LDLCALC 102*  TRIG 120  CHOLHDL 5.1   Thyroid Function Tests: No results for input(s): TSH, T4TOTAL, FREET4, T3FREE, THYROIDAB in the last 72 hours. Anemia Panel: No results for input(s): VITAMINB12, FOLATE, FERRITIN, TIBC, IRON, RETICCTPCT in the last 72 hours. Sepsis Labs: No results for input(s): PROCALCITON, LATICACIDVEN in the last 168 hours.  No results found for this or any previous visit (from the past 240 hour(s)).       Radiology Studies: Dg Chest 2 View  Result Date: 12/23/2016 CLINICAL DATA:  TIA. EXAM: CHEST  2 VIEW COMPARISON:  None. FINDINGS: There is moderate cardiac enlargement. No pleural effusion or edema. No airspace opacity identified. Degenerative disc disease noted within the thoracic spine. IMPRESSION: 1. No acute cardiopulmonary abnormalities. Electronically Signed   By: Signa Kellaylor  Stroud M.D.   On: 12/23/2016 20:52   Ct Angio Neck W Or Wo Contrast  Result Date: 12/25/2016 CLINICAL DATA:  Stroke. Three small acute infarcts demonstrated on concomitant MRI. EXAM: CT ANGIOGRAPHY NECK TECHNIQUE: Multidetector CT imaging of the neck was performed using the standard protocol during bolus administration of intravenous contrast. Multiplanar CT image reconstructions and MIPs were obtained to evaluate the vascular  anatomy. Carotid stenosis measurements (when applicable) are obtained utilizing NASCET criteria, using the distal internal carotid diameter as the denominator. CONTRAST:  50 mL Isovue 370 COMPARISON:  Brain MRI 12/24/2016 FINDINGS: Aortic arch: There is a normal variant aortic arch branching pattern with the brachiocephalic and left common carotid arteries sharing a common origin. No dissection or stenosis. The proximal subclavian arteries are normal. Right carotid system: No evidence of dissection, stenosis (50% or greater) or occlusion. Left carotid system: No evidence of dissection, stenosis (50% or greater) or occlusion. Vertebral arteries: Vertebral system is right dominant. The right vertebral artery origin is normal, as is the remainder of its course. The left vertebral artery is markedly diminutive along its entire course. The left vertebral artery terminates in the posterior inferior cerebellar artery. Skeleton: No acute abnormality. Other neck: No focal abnormality. Upper chest: Clear. IMPRESSION: 1.  Normal bilateral carotid systems. 2. Congenitally diminutive left vertebral artery. Electronically Signed   By: Deatra Robinson M.D.   On: 12/25/2016 05:47   Mr Brain Wo Contrast  Result Date: 12/24/2016 CLINICAL DATA:  Stroke with right upper extremity weakness and difficulty speaking. EXAM: MRI HEAD WITHOUT CONTRAST MRA HEAD WITHOUT CONTRAST TECHNIQUE: Multiplanar, multiecho pulse sequences of the brain and surrounding structures were obtained without intravenous contrast. Angiographic images of the head were obtained using MRA technique without contrast. COMPARISON:  Head CT from yesterday FINDINGS: MRI HEAD FINDINGS Brain: 3 3-4 mm acute infarcts in the bilateral parasagittal high frontal cortex and along the left postcentral gyrus. These are in the bilateral ACA and left MCA distribution. Moderate remote infarct affecting the posterior left insula and inferolateral left frontal cortex and white matter.  No acute hemorrhage, hydrocephalus, or masslike findings. Vascular: Arterial findings below. Normal dural venous sinus flow voids. Skull and upper cervical spine: Negative for marrow lesion. Sinuses/Orbits: Negative MRA HEAD FINDINGS Symmetric carotid arteries. Mild right A1 hypoplasia go with robust anterior communicating artery. Bilateral fetal type PCA. Strongly dominant right vertebral artery with left vertebral artery essentially ending and high CT. No branch occlusion, flow limiting stenosis, or beading. Negative for aneurysm. IMPRESSION: 1. Three tiny acute cortical infarcts in the bilateral frontal and left parietal lobes, -bilateral ACA and left MCA territories. 2. Remote moderate infarct affecting the left insula and posterolateral frontal lobe. 3. Negative intracranial MRA. No occlusion, stenosis, or visible embolic source. Electronically Signed   By: Marnee Spring M.D.   On: 12/24/2016 10:04   Mr Maxine Glenn Head/brain ZO Cm  Result Date: 12/24/2016 CLINICAL DATA:  Stroke with right upper extremity weakness and difficulty speaking. EXAM: MRI HEAD WITHOUT CONTRAST MRA HEAD WITHOUT CONTRAST TECHNIQUE: Multiplanar, multiecho pulse sequences of the brain and surrounding structures were obtained without intravenous contrast. Angiographic images of the head were obtained using MRA technique without contrast. COMPARISON:  Head CT from yesterday FINDINGS: MRI HEAD FINDINGS Brain: 3 3-4 mm acute infarcts in the bilateral parasagittal high frontal cortex and along the left postcentral gyrus. These are in the bilateral ACA and left MCA distribution. Moderate remote infarct affecting the posterior left insula and inferolateral left frontal cortex and white matter. No acute hemorrhage, hydrocephalus, or masslike findings. Vascular: Arterial findings below. Normal dural venous sinus flow voids. Skull and upper cervical spine: Negative for marrow lesion. Sinuses/Orbits: Negative MRA HEAD FINDINGS Symmetric carotid  arteries. Mild right A1 hypoplasia go with robust anterior communicating artery. Bilateral fetal type PCA. Strongly dominant right vertebral artery with left vertebral artery essentially ending and high CT. No branch occlusion, flow limiting stenosis, or beading. Negative for aneurysm. IMPRESSION: 1. Three tiny acute cortical infarcts in the bilateral frontal and left parietal lobes, -bilateral ACA and left MCA territories. 2. Remote moderate infarct affecting the left insula and posterolateral frontal lobe. 3. Negative intracranial MRA. No occlusion, stenosis, or visible embolic source. Electronically Signed   By: Marnee Spring M.D.   On: 12/24/2016 10:04   Ct Head Code Stroke W/o Cm  Result Date: 12/23/2016 CLINICAL DATA:  Code stroke. Right arm numbness and inability to speak. Last seen normal potentially 1 week ago. EXAM: CT HEAD WITHOUT CONTRAST TECHNIQUE: Contiguous axial images were obtained from the base of the skull through the vertex without intravenous contrast. COMPARISON:  11/17/2012 FINDINGS: Brain: There is cortical low-density and volume loss involving the posterior insula and lateral posterior frontal cortex. The subjacent posterior frontal white matter was also  involved. This has the appearance of a nonacute infarct. No acute infarct is seen. No hemorrhage or hydrocephalus. No masslike findings. Vascular: Carotid siphon calcifications.  No hyperdense vessel. Skull: Negative Sinuses/Orbits: Negative Other: These results were called by telephone at the time of interpretation on 12/23/2016 at 8:16 pm to Dr. Shaune Pollack , who verbally acknowledged these results. ASPECTS [facility] Stroke Program Early CT Score) Not scored in this clinical setting. IMPRESSION: Moderate infarct in the left insula and lateral frontal lobe with nonacute appearance. Electronically Signed   By: Marnee Spring M.D.   On: 12/23/2016 20:19        Scheduled Meds: . apixaban  5 mg Oral BID  . aspirin EC  81 mg  Oral Daily  . atorvastatin  40 mg Oral Daily  . diltiazem  60 mg Oral QID  . magnesium oxide  1,000 mg Oral Daily   Continuous Infusions:   LOS: 1 day    Time spent: 35 minutes.    Kathlen Mody, MD [facility] Pager [phone]  If 7PM-7AM, please contact night-coverage [email or web address] Password [facility] 12/25/2016, 3:54 PM

## 2016-12-25 NOTE — Discharge Summary (Signed)
Physician Discharge Summary  VIRGINIA FRANCISCO WJX:[id number] DOB: [birth year] DOA: 12/23/2016  PCP: System, Pcp Not In  Admit date: 12/23/2016 Discharge date: 12/25/2016  Admitted From: Home.  Disposition:  HOME.   Recommendations for Outpatient Follow-up:  1. Follow up with PCP in 1-2 weeks 2. Please obtain BMP/CBC in one week 3. Please follow up with neurology and cardiology as recommended.  4. Follow up with Dr [name] as outpatient for AAA     Discharge Condition: stable.  CODE STATUS:full code.  Diet recommendation: Heart Healthy Brief/Interim Summary: Caleb [name] a 56 y.o.malewith history of atrial fibrillation on Apixaban, history of stroke, hypertension, thoracic aortic aneurysm presents to the ER with complaints of right upper extremity weakness and difficulty speaking. MRI brain shows Three tiny acute cortical infarcts in the bilateral frontal and left parietal lobes, -bilateral ACA and left MCA territories. Neurology consulted and recommendations to add aspirin to eliquis. Echocardiogram was abnormal with 20% EF and diffuse hypokinesis. Cardiology consulted and recommended medical management and outpatient follow up .   Discharge Diagnoses:  Principal Problem:   Acute ischemic stroke [facility]) Active Problems:   Hypertension   Aortic aneurysm, thoracic (HCC)   Persistent atrial fibrillation (HCC)   Stroke (cerebrum) (HCC)   Stroke (HCC)   Cardiomyopathy (HCC)   Acute ischemic stroke in the bilateral frontal and left parietal lobes and remote infarct.  Pt reports compliance to eliquis.  Aspirin 81 mg added in addition to eliquis as he failed eliquis . Carotid US does not show sig stenosis. And CT neck, no abnormalities.  LDL is 102 and hgba1c is 5.4 Echocardiogram shows LVEF of 20%, diffuse hypokinesis and apical thrombus cannot be seen.  Cardiology consulted for the abnormal echocardiogram.  Recommended restart his meds which he hasnt been taking, like coreg,  losartan, digoxin, lipitor, and lasix.  Outpatient follow up with cardiology.    Hypertension:  Sub optimal, restarted coreg, losartan, lasix.  Recommended to check bp every day at home.    Hypokalemia  Replaced.    Chronic atrial fibrillation:  On eliquis for anticoagulation, was on cardizem for rate control.  Usually follows up with a cardiologist at [place].  Changed to coreg.  Added aspirin to eliquis .  Rate still in upper 90's .    Ascending Aortic aneurysm:  Followed with Dr [name] in the past , recommend follow up as outpatient.  Discussed with the patient.    Hyperlipidemia:  Resume atorvastatin.     Discharge Instructions  Discharge Instructions    (HEART FAILURE PATIENTS) Call MD:  Anytime you have any of the following symptoms: 1) 3 pound weight gain in 24 hours or 5 pounds in 1 week 2) shortness of breath, with or without a dry hacking cough 3) swelling in the hands, feet or stomach 4) if you have to sleep on extra pillows at night in order to breathe.    Complete by:  As directed    Call MD for:  extreme fatigue    Complete by:  As directed    Call MD for:  persistant dizziness or light-headedness    Complete by:  As directed    Diet - low sodium heart healthy    Complete by:  As directed    Discharge instructions    Complete by:  As directed    Please follow up with PCP In 2 weeks.  Please follow up with neurology as recommended in 6 weeks.  Please follow up with cardiology as recommended.  Please follow up with Dr [name] to evaluate your aortic aneurysm.     Allergies as of 12/25/2016   No Known Allergies     Medication List    STOP taking these medications   diltiazem 60 MG tablet Commonly known as:  CARDIZEM   GINKOBA PO   GINSENG PO   [place] 1000 MG Tabs     TAKE these medications   aspirin 81 MG EC tablet Take 1 tablet (81 mg total) by mouth daily.   atorvastatin 40 MG tablet Commonly known as:  LIPITOR Take 1 tablet (40  mg total) by mouth daily.   carvedilol 3.125 MG tablet Commonly known as:  COREG Take 1 tablet (3.125 mg total) by mouth 2 (two) times daily with a meal.   digoxin 0.125 MG tablet Commonly known as:  LANOXIN Take 1 tablet (0.125 mg total) by mouth daily.   diphenhydrAMINE 25 MG tablet Commonly known as:  BENADRYL Take 50 mg by mouth every 6 (six) hours as needed for allergies.   ELIQUIS 5 MG Tabs tablet Generic drug:  apixaban Take 5 mg by mouth 2 (two) times daily.   furosemide 40 MG tablet Commonly known as:  LASIX Take 1 tablet (40 mg total) by mouth daily.   losartan 25 MG tablet Commonly known as:  COZAAR Take 1 tablet (25 mg total) by mouth daily. What changed:  how much to take   Magnesium 500 MG Tabs Take 1,000 mg by mouth daily.   potassium chloride SA 20 MEQ tablet Commonly known as:  K-DUR,KLOR-CON Take 1 tablet (20 mEq total) by mouth daily.   VITAMIN D PO Take 1 tablet by mouth daily.   ZINC PO Take 1 tablet by mouth daily.      Follow-up Information    Croitoru, Mihai, MD Follow up.   Specialty:  Cardiology Why:  Our office will call you to schedule cardiology follow up appointment. Please arrive 20 minutes early and bring all of your medications to your appointment.  Contact information: [address] [place] [address] [phone]        Marvel Plan, MD. Schedule an appointment as soon as possible for a visit in 6 week(s).   Specialty:  Neurology Contact information: [address] [place] [address] [phone]        Alleen Borne, MD. Schedule an appointment as soon as possible for a visit in 4 week(s).   Specialty:  Cardiothoracic Surgery Why:  for aortic aneurysm follow up.  Contact information: [address] [employer] [address] [place] [address] [phone]          No Known Allergies  Consultations:  Cardiology  Neurology   Procedures/Studies: Dg Chest 2 View  Result  Date: 12/23/2016 CLINICAL DATA:  TIA. EXAM: CHEST  2 VIEW COMPARISON:  None. FINDINGS: There is moderate cardiac enlargement. No pleural effusion or edema. No airspace opacity identified. Degenerative disc disease noted within the thoracic spine. IMPRESSION: 1. No acute cardiopulmonary abnormalities. Electronically Signed   By: Signa Kell M.D.   On: 12/23/2016 20:52   Ct Angio Neck W Or Wo Contrast  Result Date: 12/25/2016 CLINICAL DATA:  Stroke. Three small acute infarcts demonstrated on concomitant MRI. EXAM: CT ANGIOGRAPHY NECK TECHNIQUE: Multidetector CT imaging of the neck was performed using the standard protocol during bolus administration of intravenous contrast. Multiplanar CT image reconstructions and MIPs were obtained to evaluate the vascular anatomy. Carotid stenosis measurements (when applicable) are obtained utilizing  NASCET criteria, using the distal internal carotid diameter as the denominator. CONTRAST:  50 mL Isovue 370 COMPARISON:  Brain MRI 12/24/2016 FINDINGS: Aortic arch: There is a normal variant aortic arch branching pattern with the brachiocephalic and left common carotid arteries sharing a common origin. No dissection or stenosis. The proximal subclavian arteries are normal. Right carotid system: No evidence of dissection, stenosis (50% or greater) or occlusion. Left carotid system: No evidence of dissection, stenosis (50% or greater) or occlusion. Vertebral arteries: Vertebral system is right dominant. The right vertebral artery origin is normal, as is the remainder of its course. The left vertebral artery is markedly diminutive along its entire course. The left vertebral artery terminates in the posterior inferior cerebellar artery. Skeleton: No acute abnormality. Other neck: No focal abnormality. Upper chest: Clear. IMPRESSION: 1. Normal bilateral carotid systems. 2. Congenitally diminutive left vertebral artery. Electronically Signed   By: Deatra Robinson M.D.   On: 12/25/2016  05:47   Mr Brain Wo Contrast  Result Date: 12/24/2016 CLINICAL DATA:  Stroke with right upper extremity weakness and difficulty speaking. EXAM: MRI HEAD WITHOUT CONTRAST MRA HEAD WITHOUT CONTRAST TECHNIQUE: Multiplanar, multiecho pulse sequences of the brain and surrounding structures were obtained without intravenous contrast. Angiographic images of the head were obtained using MRA technique without contrast. COMPARISON:  Head CT from yesterday FINDINGS: MRI HEAD FINDINGS Brain: 3 3-4 mm acute infarcts in the bilateral parasagittal high frontal cortex and along the left postcentral gyrus. These are in the bilateral ACA and left MCA distribution. Moderate remote infarct affecting the posterior left insula and inferolateral left frontal cortex and white matter. No acute hemorrhage, hydrocephalus, or masslike findings. Vascular: Arterial findings below. Normal dural venous sinus flow voids. Skull and upper cervical spine: Negative for marrow lesion. Sinuses/Orbits: Negative MRA HEAD FINDINGS Symmetric carotid arteries. Mild right A1 hypoplasia go with robust anterior communicating artery. Bilateral fetal type PCA. Strongly dominant right vertebral artery with left vertebral artery essentially ending and high CT. No branch occlusion, flow limiting stenosis, or beading. Negative for aneurysm. IMPRESSION: 1. Three tiny acute cortical infarcts in the bilateral frontal and left parietal lobes, -bilateral ACA and left MCA territories. 2. Remote moderate infarct affecting the left insula and posterolateral frontal lobe. 3. Negative intracranial MRA. No occlusion, stenosis, or visible embolic source. Electronically Signed   By: Marnee Spring M.D.   On: 12/24/2016 10:04   Mr Maxine Glenn Head/brain ZO Cm  Result Date: 12/24/2016 CLINICAL DATA:  Stroke with right upper extremity weakness and difficulty speaking. EXAM: MRI HEAD WITHOUT CONTRAST MRA HEAD WITHOUT CONTRAST TECHNIQUE: Multiplanar, multiecho pulse sequences of the  brain and surrounding structures were obtained without intravenous contrast. Angiographic images of the head were obtained using MRA technique without contrast. COMPARISON:  Head CT from yesterday FINDINGS: MRI HEAD FINDINGS Brain: 3 3-4 mm acute infarcts in the bilateral parasagittal high frontal cortex and along the left postcentral gyrus. These are in the bilateral ACA and left MCA distribution. Moderate remote infarct affecting the posterior left insula and inferolateral left frontal cortex and white matter. No acute hemorrhage, hydrocephalus, or masslike findings. Vascular: Arterial findings below. Normal dural venous sinus flow voids. Skull and upper cervical spine: Negative for marrow lesion. Sinuses/Orbits: Negative MRA HEAD FINDINGS Symmetric carotid arteries. Mild right A1 hypoplasia go with robust anterior communicating artery. Bilateral fetal type PCA. Strongly dominant right vertebral artery with left vertebral artery essentially ending and high CT. No branch occlusion, flow limiting stenosis, or beading. Negative for aneurysm. IMPRESSION: 1.  Three tiny acute cortical infarcts in the bilateral frontal and left parietal lobes, -bilateral ACA and left MCA territories. 2. Remote moderate infarct affecting the left insula and posterolateral frontal lobe. 3. Negative intracranial MRA. No occlusion, stenosis, or visible embolic source. Electronically Signed   By: Marnee SpringJonathon  Watts M.D.   On: 12/24/2016 10:04   Ct Head Code Stroke W/o Cm  Result Date: 12/23/2016 CLINICAL DATA:  Code stroke. Right arm numbness and inability to speak. Last seen normal potentially 1 week ago. EXAM: CT HEAD WITHOUT CONTRAST TECHNIQUE: Contiguous axial images were obtained from the base of the skull through the vertex without intravenous contrast. COMPARISON:  11/17/2012 FINDINGS: Brain: There is cortical low-density and volume loss involving the posterior insula and lateral posterior frontal cortex. The subjacent posterior  frontal white matter was also involved. This has the appearance of a nonacute infarct. No acute infarct is seen. No hemorrhage or hydrocephalus. No masslike findings. Vascular: Carotid siphon calcifications.  No hyperdense vessel. Skull: Negative Sinuses/Orbits: Negative Other: These results were called by telephone at the time of interpretation on 12/23/2016 at 8:16 pm to Dr. Shaune PollackAMERON ISAACS , who verbally acknowledged these results. ASPECTS [facility]([place] Stroke Program Early CT Score) Not scored in this clinical setting. IMPRESSION: Moderate infarct in the left insula and lateral frontal lobe with nonacute appearance. Electronically Signed   By: Marnee SpringJonathon  Watts M.D.   On: 12/23/2016 20:19    Echocardiogram.    Subjective: No new complaints. Wants to go home.   Discharge Exam: Vitals:   12/25/16 1730 12/25/16 1733  BP: 118/86 132/90  Pulse: 98   Resp: 20   Temp: 97.6 F (36.4 C)    Vitals:   12/25/16 1017 12/25/16 1210 12/25/16 1730 12/25/16 1733  BP:  (!) 127/97 118/86 132/90  Pulse:  93 98   Resp:  20 20   Temp:  98.1 F (36.7 C) 97.6 F (36.4 C)   TempSrc:  Oral Oral   SpO2: 98% 98% 100%   Weight:      Height:        General: Pt is alert, awake, not in acute distress Cardiovascular: RRR, S1/S2 +, no rubs, no gallops Respiratory: CTA bilaterally, no wheezing, no rhonchi Abdominal: Soft, NT, ND, bowel sounds + Extremities: no edema, no cyanosis    The results of significant diagnostics from this hospitalization (including imaging, microbiology, ancillary and laboratory) are listed below for reference.     Microbiology: No results found for this or any previous visit (from the past 240 hour(s)).   Labs: BNP (last 3 results) No results for input(s): BNP in the last 8760 hours. Basic Metabolic Panel:  Recent Labs Lab 12/23/16 2032 12/24/16 0113  NA 141 138  K 3.7 3.4*  CL 105 106  CO2 28 26  GLUCOSE 93 91  BUN 18 14  CREATININE 1.08 0.99  CALCIUM 9.2 8.8*    Liver Function Tests:  Recent Labs Lab 12/23/16 2032 12/24/16 0113  AST 33 30  ALT 40 35  ALKPHOS 48 44  BILITOT 1.5* 1.3*  PROT 6.8 5.9*  ALBUMIN 4.1 3.6   No results for input(s): LIPASE, AMYLASE in the last 168 hours. No results for input(s): AMMONIA in the last 168 hours. CBC:  Recent Labs Lab 12/23/16 2032 12/24/16 0113  WBC 6.8 5.9  NEUTROABS 2.7  --   HGB 15.1 14.0  HCT 43.3 42.5  MCV 82.0 84.0  PLT 192 184   Cardiac Enzymes: No results for input(s): CKTOTAL, CKMB,  CKMBINDEX, TROPONINI in the last 168 hours. BNP: Invalid input(s): POCBNP CBG: No results for input(s): GLUCAP in the last 168 hours. D-Dimer No results for input(s): DDIMER in the last 72 hours. Hgb A1c  Recent Labs  12/24/16 0113  HGBA1C 5.4   Lipid Profile  Recent Labs  12/24/16 0113  CHOL 157  HDL 31*  LDLCALC 102*  TRIG 120  CHOLHDL 5.1   Thyroid function studies No results for input(s): TSH, T4TOTAL, T3FREE, THYROIDAB in the last 72 hours.  Invalid input(s): FREET3 Anemia work up No results for input(s): VITAMINB12, FOLATE, FERRITIN, TIBC, IRON, RETICCTPCT in the last 72 hours. Urinalysis    Component Value Date/Time   COLORURINE YELLOW 12/23/2016 2049   APPEARANCEUR CLEAR 12/23/2016 2049   LABSPEC 1.026 12/23/2016 2049   PHURINE 8.0 12/23/2016 2049   GLUCOSEU NEGATIVE 12/23/2016 2049   HGBUR NEGATIVE 12/23/2016 2049   BILIRUBINUR NEGATIVE 12/23/2016 2049   KETONESUR NEGATIVE 12/23/2016 2049   PROTEINUR 30 (A) 12/23/2016 2049   NITRITE NEGATIVE 12/23/2016 2049   LEUKOCYTESUR TRACE (A) 12/23/2016 2049   Sepsis Labs Invalid input(s): PROCALCITONIN,  WBC,  LACTICIDVEN Microbiology No results found for this or any previous visit (from the past 240 hour(s)).   Time coordinating discharge: Over 30 minutes  SIGNED:   Kathlen Mody, MD  [facility] 12/25/2016, 7:37 PM Pager   If 7PM-7AM, please contact night-coverage [email or web address] Password [email or web address]
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
## 2016-12-25 NOTE — Progress Notes (Signed)
Discharge instructions reviewed with patient. All questions answered at this time. Transport home by family.   Orin Eberwein, RN 

## 2016-12-25 NOTE — Progress Notes (Signed)
STROKE TEAM PROGRESS NOTE   SUBJECTIVE (INTERVAL HISTORY) No family is at the bedside.  Pt is standing by bedside, and is awake, alert, and oriented.  Proposed adding aspirin versus switching patient to coumadin or Xarelto, as he states he cannot afford Xarelto.  Patient prefers not to take warfarin.  Patient amenable to adding aspirin to his Eliquis.  LMWH 1.51.  2D echo with likely apical thrombus.   OBJECTIVE Temp:  [97.5 F (36.4 C)-98.6 F (37 C)] 98.1 F (36.7 C) (07/19 1210) Pulse Rate:  [82-93] 93 (07/19 1210) Cardiac Rhythm: Atrial fibrillation (07/19 0700) Resp:  [18-20] 20 (07/19 1210) BP: (124-132)/(84-99) 127/97 (07/19 1210) SpO2:  [97 %-98 %] 98 % (07/19 1210)  CBC:   Recent Labs Lab 12/23/16 2032 12/24/16 0113  WBC 6.8 5.9  NEUTROABS 2.7  --   HGB 15.1 14.0  HCT 43.3 42.5  MCV 82.0 84.0  PLT 192 184    Basic Metabolic Panel:   Recent Labs Lab 12/23/16 2032 12/24/16 0113  NA 141 138  K 3.7 3.4*  CL 105 106  CO2 28 26  GLUCOSE 93 91  BUN 18 14  CREATININE 1.08 0.99  CALCIUM 9.2 8.8*    Lipid Panel:     Component Value Date/Time   CHOL 157 12/24/2016 0113   TRIG 120 12/24/2016 0113   HDL 31 (L) 12/24/2016 0113   CHOLHDL 5.1 12/24/2016 0113   VLDL 24 12/24/2016 0113   LDLCALC 102 (H) 12/24/2016 0113   HgbA1c:  Lab Results  Component Value Date   HGBA1C 5.4 12/24/2016   Urine Drug Screen:     Component Value Date/Time   LABOPIA NONE DETECTED 12/23/2016 2049   COCAINSCRNUR NONE DETECTED 12/23/2016 2049   LABBENZ NONE DETECTED 12/23/2016 2049   AMPHETMU NONE DETECTED 12/23/2016 2049   THCU POSITIVE (A) 12/23/2016 2049   LABBARB NONE DETECTED 12/23/2016 2049    Alcohol Level     Component Value Date/Time   ETH 11 11/17/2012 0536    IMAGING I have personally reviewed the radiological images below and agree with the radiology interpretations.  Dg Chest 2 View 12/23/2016 IMPRESSION: 1. No acute cardiopulmonary abnormalities.   Mr  Brain 9 Contrast Mr Maxine Glenn Head/brain Wo Cm 12/24/2016 IMPRESSION: 1. Three tiny acute cortical infarcts in the bilateral frontal and left parietal lobes, -bilateral ACA and left MCA territories. 2. Remote moderate infarct affecting the left insula and posterolateral frontal lobe. 3. Negative intracranial MRA. No occlusion, stenosis, or visible embolic source.    Ct Head Code Stroke W/o Cm 12/23/2016 IMPRESSION: Moderate infarct in the left insula and lateral frontal lobe with nonacute appearance.   Carotid US 12/24/2016 B ICA 1-39% stenosis, VAs antegrade  2D Echo 12/24/2016 - LV EF: 20%. Aortic valve pressure half time is , indicating mild regurgitation. However, the aortic regurgitation appears moderate. Cannot rule out apical thrombus. Consider limited echo with Definity contrast to better evaluate.  CTA neck  12/25/2016 IMPRESSION: 1. Normal bilateral carotid systems. 2. Congenitally diminutive left vertebral artery.   PHYSICAL EXAM  Temp:  [97.5 F (36.4 C)-98.6 F (37 C)] 98.1 F (36.7 C) (07/19 1210) Pulse Rate:  [82-93] 93 (07/19 1210) Resp:  [18-20] 20 (07/19 1210) BP: (124-132)/(84-99) 127/97 (07/19 1210) SpO2:  [97 %-98 %] 98 % (07/19 1210)  General - Well nourished, well developed, in no apparent distress.  Ophthalmologic - Sharp disc margins OU.   Cardiovascular - irregularly irregular heart rate and rhythm.  Mental Status -  Level of arousal and orientation to time, place, and person were intact. Language including expression, naming, repetition, comprehension was assessed and found intact except intermittent mild hesitancy on speech. Fund of Knowledge was assessed and was intact.  Cranial Nerves II - XII - II - Visual field intact OU. III, IV, VI - Extraocular movements intact. V - Facial sensation intact bilaterally. VII - Facial movement intact bilaterally. VIII - Hearing & vestibular intact bilaterally. X - Palate elevates symmetrically. XI - Chin  turning & shoulder shrug intact bilaterally. XII - Tongue protrusion intact.  Motor Strength - The patient's strength was normal in all extremities and pronator drift was absent.  Bulk was normal and fasciculations were absent.   Motor Tone - Muscle tone was assessed at the neck and appendages and was normal.  Reflexes - The patient's reflexes were 1+ in all extremities and he had no pathological reflexes.  Sensory - Light touch, temperature/pinprick, vibration and proprioception were assessed and were symmetrical.    Coordination - The patient had normal movements in the hands and feet with no ataxia or dysmetria.  Tremor was absent.  Gait and Station - The patient's transfers, posture, gait, station, and turns were observed as normal.   ASSESSMENT/PLAN Caleb [name] is a 56 y.o. male with history of thoracic aortic aneurysm, concussion with loss of consciousness, closed T1 spine fracture, heart failure, and stroke presenting with 1 week history of dysphasia followed by episode of transient RUE weakness. He did not receive IV t-PA due to arriving outside of the treatment widow.   Stroke: Three tiny acute left MCA cortical infarcts in bilateral ACA and left MCA territories, likely embolic due to afib and low EF  Resultant  Intermittent hesitancy speech  CT head: no acute stroke  MRI head: Three tiny acute cortical infarcts in the bilateral frontal and left parietal lobes, -bilateral ACA and left MCA territories  MRA head: negative  CTA neck - negative  Carotid Doppler: B ICA 1-39% stenosis, VAs antegrade  2D Echo: can not rule out apical thrombus,  LV EF: 20%  Recommend cardiology consultation to consider possible TEE, cardiac cath, and CHF treatment  LDL 102  HgbA1c 5.4  Check factor Xa level 1.51 - indicating patient responses to eliquis  Eliquis for VTE prophylaxis Diet Heart Room service appropriate? Yes; Fluid consistency: Thin  Eliquis (apixaban) daily prior to  admission, now on Eliquis (apixaban) bid. Due to failure of Eliquis, and pt not able to affort Xarelto, recommend to add ASA 81mg  on top of Eliquis. However, will also consider cardiology opinions on possible apical thrombus.   Patient counseled to be compliant with his antithrombotic medications  Ongoing aggressive stroke risk factor management  Therapy recommendations: None  Disposition:  Pending  Chronic afib on eliquis  On cardizem for rate control  On eliquis but now new small stroke embolic pattern  Will recommend to add ASA 81mg  on top of eliquis for now  Cardiomyopathy with low EF and questionable apical thrombus  TTE showed EF 20% and cannot rule out apical thrombus  Recommend cardiology consultation for further management  Continue eliquis and ASA for now.  Hypertension  Stable  Permissive hypertension (OK if < 180/105) but gradually normalize in 5-7 days  Long-term BP goal normotensive  Hyperlipidemia  Home meds: atorvastatin 40mg  PO daily, resumed in hospital  LDL 102, goal < 70  Continue statin at discharge  Other Stroke Risk Factors  Marijuana user, advised to stop smoking  H[place] with similar symptoms, found to have afib and put on eliquis  Other Active Problems  Hyperbilirubinemia, mild 1.3  Thoracic AA  ADHD  Hospital day # 1  Marvel PlanJindong Park Beck, MD PhD Stroke Neurology 12/25/2016 1:26 PM    To contact Stroke Continuity provider, please refer to [email or web address]. After hours, contact General Neurology

## 2016-12-25 NOTE — Progress Notes (Signed)
  Echocardiogram 2D Echocardiogram has been performed.  Caleb [name] Caleb [name] 12/25/2016, 11:11 AM

## 2016-12-26 ENCOUNTER — Other Ambulatory Visit: Payer: Self-pay | Admitting: Neurology

## 2016-12-26 ENCOUNTER — Telehealth: Payer: Self-pay | Admitting: Cardiovascular Disease

## 2016-12-26 DIAGNOSIS — I63412 Cerebral infarction due to embolism of left middle cerebral artery: Secondary | ICD-10-CM

## 2016-12-26 MED FILL — CARVEDILOL 3.125 MG TABLET: 3.125 | 30 days supply | Qty: 60 | Fill #0

## 2016-12-26 MED FILL — POTASSIUM CL ER 20 MEQ TAB: 20 | 30 days supply | Qty: 30 | Fill #0

## 2016-12-26 MED FILL — LOSARTAN POTASSIUM 25 MG TA: 25 | 30 days supply | Qty: 30 | Fill #0

## 2016-12-26 MED FILL — DIGOXIN 125 MCG TABLET: 125 | 30 days supply | Qty: 30 | Fill #0 | Status: TO

## 2016-12-26 NOTE — Telephone Encounter (Signed)
Pt walked in clinic went out to talk to pt but he has already left. S/w pt informed pt that Entresto in his hospital anywhere. Pt insists that he needs to take this per Dr Royann Shiversroitoru, he spoke with him in the hospital yesterday, do not see this mentioned in his note either. He states that he needs an prescription so he can have this filled. Informed pt that Dr Royann Shiversroitoru is not in the office today but will be back in the office on Monday. Please advise

## 2016-12-26 NOTE — Telephone Encounter (Signed)
He says that he has insurance now

## 2016-12-26 NOTE — Telephone Encounter (Signed)
Pt notified of Dr Erin Hearingroitoru's message he will discuss this with Caleb [name] at that appointment. He will continue all other medications until that appoimtnent

## 2016-12-26 NOTE — Telephone Encounter (Signed)
New message    Sister called in, she is not on patient DPR  She said the patient was giving Entresto at the hospital but does not have financial means to get it.   Patient calling the office for samples of medication:   1.  What medication and dosage are you requesting samples for? Entresto   2.  Are you currently out of this medication? Yes   She also wants the patient to be seen Monday we do not currently have any  Openings for Monday, staff message said 2 week follow up with DR C or APP?

## 2016-12-26 NOTE — Telephone Encounter (Signed)
We talked about losartan for now, Sherryll Burgerntresto when he gets medical insurance

## 2016-12-26 NOTE — Telephone Encounter (Signed)
-----   Message from Berton BonJanine Hammond, NP sent at 12/26/2016  9:50 AM EDT ----- Regarding: Schedule f/u Please call pt to schedule hospital follow up for about 2 weeks with Dr. Royann Shiversroitoru or an APP on his team.   Thanks, Lizabeth LeydenNina Hammond, NP

## 2016-12-26 NOTE — Telephone Encounter (Signed)
We can start it at his appointment on August 7 with [name] Barrett.

## 2016-12-26 NOTE — Telephone Encounter (Signed)
° ° °  LMOM for patient to return call to schedule appt for follow up with DR C or his APP

## 2017-01-05 ENCOUNTER — Other Ambulatory Visit: Payer: Self-pay | Admitting: *Deleted

## 2017-01-05 NOTE — Patient Outreach (Signed)
[facility] Care Management  01/05/2017  Caleb BellingSamir M [name] 02/20/1961 [id number]  EMMI-Stroke RED ON EMMI ALERT DAY#: 9 DATE: 01/04/17 RED ALERT: Lost interest in things they used to enjoy? Yes  Outreach attempt #1, spoke with patient. Reviewed and addressed red alert. Patient stated, "He is doing well". He doesn't think the accurate answer was recorded during the stroke questionnaire.Marland Kitchen. He verbalized having interest in doing things. Patient acknowledge knowing signs and symptoms of a stroke. He was unable to verbalize s/s of a stroke. RN CM discussed the acronym FAST with patient. Patient reported, he received stroke educational materials from the hospital. He understood his hospital discharge paperwork. He can afford his medications and taking them as prescribed by the MD. He has an appointment with Neurology on 02/24/17. His cardiology appointment is scheduled on 01/13/17. He needs to arrange an appointment with Dr. Laneta SimmersBartle for f/u with his thoracic aortic aneurysm. Patient reported, he doesn't have a PCP in this area. He plans to make finding a PCP a priority.    Plan: RN CM will call [facility] to schedule patient a discharge hospital appointment.  Wynelle ClevelandJuanita Graylen Noboa, RN, BSN, MHA/MSL, [facility] Telephonic Care Manager Coordinator [facility] Direct Phone: [phone] Toll Free: [phone] Fax: [phone]

## 2017-01-05 NOTE — Patient Outreach (Addendum)
[facility] Care Management  01/05/2017  Caleb BellingSamir M [name] 10/10/1960 [id number]  Care Coordination  RN CM [name] arranged a f/u hospital discharge appointment with [name], [facility] for patient on 01/13/17 at 2 pm.  RN CM Dorann Lodge(Bambie Pizzolato) called patient back to give him the appointment information. Patient was thankful for assisting him with arranging the appointment.   Plan:  RN CM will notify [facility] administrative assistant of case closure.   [name], RN, BSN, MHA/MSL, [facility] Telephonic Care Manager Coordinator [facility] Direct Phone: [phone] Toll Free: [phone] Fax: [phone]

## 2017-01-13 ENCOUNTER — Encounter: Payer: Self-pay | Admitting: Physician Assistant

## 2017-01-13 ENCOUNTER — Ambulatory Visit (INDEPENDENT_AMBULATORY_CARE_PROVIDER_SITE_OTHER): Payer: Self-pay | Admitting: Physician Assistant

## 2017-01-13 ENCOUNTER — Encounter: Payer: Self-pay | Admitting: Family Medicine

## 2017-01-13 ENCOUNTER — Ambulatory Visit: Payer: PRIVATE HEALTH INSURANCE | Admitting: Physician Assistant

## 2017-01-13 ENCOUNTER — Ambulatory Visit (INDEPENDENT_AMBULATORY_CARE_PROVIDER_SITE_OTHER): Payer: Self-pay | Admitting: Family Medicine

## 2017-01-13 VITALS — BP 115/70 | HR 76 | Ht 75.0 in | Wt 280.0 lb

## 2017-01-13 VITALS — BP 127/96 | HR 77 | Temp 97.9°F | Resp 16 | Ht 75.0 in | Wt 281.0 lb

## 2017-01-13 DIAGNOSIS — I5022 Chronic systolic (congestive) heart failure: Secondary | ICD-10-CM

## 2017-01-13 DIAGNOSIS — I712 Thoracic aortic aneurysm, without rupture, unspecified: Secondary | ICD-10-CM

## 2017-01-13 DIAGNOSIS — I4891 Unspecified atrial fibrillation: Secondary | ICD-10-CM

## 2017-01-13 DIAGNOSIS — Z7901 Long term (current) use of anticoagulants: Secondary | ICD-10-CM

## 2017-01-13 DIAGNOSIS — I4819 Other persistent atrial fibrillation: Secondary | ICD-10-CM

## 2017-01-13 DIAGNOSIS — I481 Persistent atrial fibrillation: Secondary | ICD-10-CM

## 2017-01-13 LAB — BASIC METABOLIC PANEL
BUN/Creatinine Ratio: 15 (ref 9–20)
BUN: 14 mg/dL (ref 6–24)
CO2: 25 mmol/L (ref 20–29)
Calcium: 9.4 mg/dL (ref 8.7–10.2)
Chloride: 104 mmol/L (ref 96–106)
Creatinine, Ser: 0.91 mg/dL (ref 0.76–1.27)
GFR, EST AFRICAN AMERICAN: 108 mL/min/{1.73_m2} (ref >59)
GFR, EST NON AFRICAN AMERICAN: 93 mL/min/{1.73_m2} (ref >59)
Glucose: 91 mg/dL (ref 65–99)
Potassium: 4.6 mmol/L (ref 3.5–5.2)
SODIUM: 142 mmol/L (ref 134–144)

## 2017-01-13 LAB — POCT URINALYSIS DIP (DEVICE)
Bilirubin Urine: NEGATIVE
Glucose, UA: NEGATIVE mg/dL
HGB URINE DIPSTICK: NEGATIVE
Ketones, ur: NEGATIVE mg/dL
LEUKOCYTES UA: NEGATIVE
Nitrite: NEGATIVE
Protein, ur: NEGATIVE mg/dL
SPECIFIC GRAVITY, URINE: 1.015 (ref 1.005–1.030)
UROBILINOGEN UA: 0.2 mg/dL (ref 0.0–1.0)
pH: 7.5 (ref 5.0–8.0)

## 2017-01-13 MED ORDER — SACUBITRIL-VALSARTAN 24-26 MG PO TABS: 1.0000 | ORAL_TABLET | Freq: Two times a day (BID) | ORAL | 5 refills | Status: DC

## 2017-01-13 NOTE — Progress Notes (Signed)
Patient ID: DIEGO ULBRICHT, male    DOB: 11-Jan-1961, 56 y.o.   MRN: [id number]  PCP: System, Pcp Not In  Chief Complaint  Patient presents with  . Hospitalization Follow-up    Subjective:  HPI JAMESYN LINDELL is a 56 y.o. male presents to establish care and hospital follow-up. Medical problems include: Atrial fibrillation, Hypertension, and thoracic aortic aneurysm. Coady presented to [facility] ED on 12/23/2016 with a complaint of right upper extremity weakness with associated speech difficulty. MRI confirmed CVA, bilateral frontal, and left parietal lobes. Echocardiogram abnormal with EF 20% with hypokinesis. Jahseh had been prescribed chronic Eliquis for management for atrial fibrillation. He lives in [place] and [place] and reports that he follows cardiology in [place]. In 2012, Alta was diagnosed with ascending aortic aneurysm by Dr. [name], which at that time measured 5.1 cm and he has had no follow-up since 2012. Recent echo measured aortic aneurysm as being approximately 4.9 cm. He remained hospitalized for 2 days and was discharged. Keanon reports declining stroke rehabilitation. He denies the presence of any deficits with the exception of some speech disturbances occasionally and the sensation that his tongue is somewhat heavy. Dymond reports that he does not like taking medication however he does take his Eliquis, aspirin, Coreg however he does not take any other hypertension medications as he does not like the way the medications make him feel. During his recent visit with cardiology , losartan was discontinued and he was prescribed Entresto. Denies any complaints today, only here to establish care. Social History   Social History  . Marital status: Unknown    Spouse name: N/A  . Number of children: N/A  . Years of education: N/A   Occupational History  . Not on file.   Social History Main Topics  . Smoking status: Never Smoker  . Smokeless tobacco: Never Used  . Alcohol use  No  . Drug use: Yes    Types: Marijuana     Comment: Smoked in the last year.   . Sexual activity: Not on file   Other Topics Concern  . Not on file   Social History Narrative  . No narrative on file    Family History  Problem Relation Age of Onset  . Hypertension Mother   . Diabetes Mother   . Heart disease Father        had CABG   Review of Systems See HPI Patient Active Problem List   Diagnosis Date Noted  . Cardiomyopathy (HCC)   . Persistent atrial fibrillation (HCC) 12/24/2016  . Stroke (cerebrum) (HCC) 12/24/2016  . Stroke (HCC) 12/24/2016  . Acute ischemic stroke (HCC) 12/23/2016  . Hypertension   . Concussion with loss of consciousness   . Aortic aneurysm, thoracic (HCC)   . Laceration of knee   . Closed fracture of six ribs   . Closed T1 spinal fracture (HCC)   . Fx cervical vertebra-closed (HCC)   . Facial laceration     No Known Allergies  Prior to Admission medications   Medication Sig Start Date End Date Taking? Authorizing Provider  apixaban (ELIQUIS) 5 MG TABS tablet Take 5 mg by mouth 2 (two) times daily.    [provider]  aspirin EC 81 MG EC tablet Take 1 tablet (81 mg total) by mouth daily. Patient not taking: Reported on 01/13/2017 12/26/16   Kathlen Mody, MD  atorvastatin (LIPITOR) 40 MG tablet Take 1 tablet (40 mg total) by mouth daily. Patient not taking:  Reported on 01/13/2017 12/25/16   Kathlen ModyAkula, Vijaya, MD  carvedilol (COREG) 3.125 MG tablet Take 1 tablet (3.125 mg total) by mouth 2 (two) times daily with a meal. Patient not taking: Reported on 01/13/2017 12/25/16   Kathlen ModyAkula, Vijaya, MD  Cholecalciferol (VITAMIN D PO) Take 1 tablet by mouth daily.    [provider]  digoxin (LANOXIN) 0.125 MG tablet Take 1 tablet (0.125 mg total) by mouth daily. Patient not taking: Reported on 01/13/2017 12/25/16   Kathlen ModyAkula, Vijaya, MD  furosemide (LASIX) 40 MG tablet Take 1 tablet (40 mg total) by mouth daily. Patient not taking: Reported on 01/13/2017  12/25/16   Kathlen ModyAkula, Vijaya, MD  losartan (COZAAR) 25 MG tablet Take 1 tablet (25 mg total) by mouth daily. Patient not taking: Reported on 01/13/2017 12/25/16   Kathlen ModyAkula, Vijaya, MD  Magnesium 500 MG TABS Take 1,000 mg by mouth daily.    [provider]  potassium chloride SA (K-DUR,KLOR-CON) 20 MEQ tablet Take 1 tablet (20 mEq total) by mouth daily. Patient not taking: Reported on 01/13/2017 12/25/16   Kathlen ModyAkula, Vijaya, MD  sacubitril-valsartan (ENTRESTO) 24-26 MG Take 1 tablet by mouth 2 (two) times daily. Patient not taking: Reported on 01/13/2017 01/13/17   Azalee CourseMeng, Hao, PA    Past Medical, Surgical Family and Social History reviewed and updated.    Objective:   Today's Vitals   01/13/17 1344  BP: (!) 127/96  Pulse: 77  Resp: 16  Temp: 97.9 F (36.6 C)  TempSrc: Oral  SpO2: 100%  Weight: 281 lb (127.5 kg)  Height: 6\' 3"  (1.905 m)    Wt Readings from Last 3 Encounters:  01/13/17 281 lb (127.5 kg)  01/13/17 280 lb (127 kg)   Physical Exam  Constitutional: He is oriented to person, place, and time. He appears well-developed and well-nourished.  HENT:  Head: Normocephalic and atraumatic.  Eyes: Pupils are equal, round, and reactive to light. Conjunctivae are normal.  Neck: Normal range of motion. Neck supple.  Cardiovascular: Normal rate, regular rhythm, normal heart sounds and intact distal pulses.   Pulmonary/Chest: Effort normal and breath sounds normal.  Musculoskeletal: Normal range of motion.  Neurological: He is alert and oriented to person, place, and time.  Skin: Skin is warm and dry.  Psychiatric: He has a normal mood and affect. His behavior is normal. Judgment and thought content normal.   Mr Brain Wo Contrast  Result Date: 12/24/2016 CLINICAL DATA:  Stroke with right upper extremity weakness and difficulty speaking. EXAM: MRI HEAD WITHOUT CONTRAST MRA HEAD WITHOUT CONTRAST TECHNIQUE: Multiplanar, multiecho pulse sequences of the brain and surrounding structures were  obtained without intravenous contrast. Angiographic images of the head were obtained using MRA technique without contrast. COMPARISON:  Head CT from yesterday FINDINGS: MRI HEAD FINDINGS Brain: 3 3-4 mm acute infarcts in the bilateral parasagittal high frontal cortex and along the left postcentral gyrus. These are in the bilateral ACA and left MCA distribution. Moderate remote infarct affecting the posterior left insula and inferolateral left frontal cortex and white matter. No acute hemorrhage, hydrocephalus, or masslike findings. Vascular: Arterial findings below. Normal dural venous sinus flow voids. Skull and upper cervical spine: Negative for marrow lesion. Sinuses/Orbits: Negative MRA HEAD FINDINGS Symmetric carotid arteries. Mild right A1 hypoplasia go with robust anterior communicating artery. Bilateral fetal type PCA. Strongly dominant right vertebral artery with left vertebral artery essentially ending and high CT. No branch occlusion, flow limiting stenosis, or beading. Negative for aneurysm. IMPRESSION: 1. Three tiny acute cortical infarcts  in the bilateral frontal and left parietal lobes, -bilateral ACA and left MCA territories. 2. Remote moderate infarct affecting the left insula and posterolateral frontal lobe. 3. Negative intracranial MRA. No occlusion, stenosis, or visible embolic source. Electronically Signed   By: Marnee Spring M.D.   On: 12/24/2016 10:04   Mr Maxine Glenn Head/brain ZO Cm  Result Date: 12/24/2016 CLINICAL DATA:  Stroke with right upper extremity weakness and difficulty speaking. EXAM: MRI HEAD WITHOUT CONTRAST MRA HEAD WITHOUT CONTRAST TECHNIQUE: Multiplanar, multiecho pulse sequences of the brain and surrounding structures were obtained without intravenous contrast. Angiographic images of the head were obtained using MRA technique without contrast. COMPARISON:  Head CT from yesterday FINDINGS: MRI HEAD FINDINGS Brain: 3 3-4 mm acute infarcts in the bilateral parasagittal high  frontal cortex and along the left postcentral gyrus. These are in the bilateral ACA and left MCA distribution. Moderate remote infarct affecting the posterior left insula and inferolateral left frontal cortex and white matter. No acute hemorrhage, hydrocephalus, or masslike findings. Vascular: Arterial findings below. Normal dural venous sinus flow voids. Skull and upper cervical spine: Negative for marrow lesion. Sinuses/Orbits: Negative MRA HEAD FINDINGS Symmetric carotid arteries. Mild right A1 hypoplasia go with robust anterior communicating artery. Bilateral fetal type PCA. Strongly dominant right vertebral artery with left vertebral artery essentially ending and high CT. No branch occlusion, flow limiting stenosis, or beading. Negative for aneurysm. IMPRESSION: 1. Three tiny acute cortical infarcts in the bilateral frontal and left parietal lobes, -bilateral ACA and left MCA territories. 2. Remote moderate infarct affecting the left insula and posterolateral frontal lobe. 3. Negative intracranial MRA. No occlusion, stenosis, or visible embolic source. Electronically Signed   By: Marnee Spring M.D.   On: 12/24/2016 10:04   Assessment & Plan:  1. Chronic systolic congestive heart failure (HCC) -Continue daily weights -Start Entresto as prescribed by cardiology, continue Coreg, furosemide , continue aspirin, -Discontinue losartan  2. Atrial fibrillation, unspecified type (HCC) -Patient was resumed on digoxin discharge from recent hospitalization, however he reports that he is not taking this medication altogether the digoxin if necessary for rate control of atrial fibrillation. -Continue Eliquis  3. Anticoagulated -Continue Eliquis and advised that if he experiences any bleeding of the gums, nose, or dark tarry stools  RTC:  Return for care in 6 months, for a complete physical exam. Patient will be returning to [place] for an extended period of time. I advised him if he is unable to make any of  his follow-up appointments with neurology or cardiology to please call to reschedule.  Godfrey Pick. Tiburcio Pea, MSN, FNP-C [facility]  [address] [place], [place] [address] [phone]

## 2017-01-13 NOTE — Progress Notes (Signed)
I would prefer the measurement of his aorta on a CT angio. We were delaying that until he got insurance coverage. Is that taken care of? MCr
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
## 2017-01-13 NOTE — Patient Instructions (Signed)
Medication Instructions:  STOP LOSARTAN START ENTRESTO DAILY If you need a refill on your cardiac medications before your next appointment, please call your pharmacy.  Labwork: BMP TODAY HERE IN OUR OFFICE AT [facility]  Testing/Procedures: Your physician has requested that you have an echocardiogram. Echocardiography is a painless test that uses sound waves to create images of your heart. It provides your doctor with information about the size and shape of your heart and how well your heart's chambers and valves are working. This procedure takes approximately one hour. There are no restrictions for this procedure.  Follow-Up: Your physician wants you to follow-up in: AFTER ECHO WITH DR ZOXWRUEA.   Special Instructions: START DAILY WEIGHTS START 2,000MG  LOW SODIUM DIET  Thank you for choosing [facility]     LOW SODIUM DIET (DASH Eating Plan)  DASH stands for "Dietary Approaches to Stop Hypertension." The DASH eating plan is a healthy eating plan that has been shown to reduce high blood pressure (hypertension). It may also reduce your risk for type 2 diabetes, heart disease, and stroke. The DASH eating plan may also help with weight loss. What are tips for following this plan? General guidelines  Avoid eating more than 2,300 mg (milligrams) of salt (sodium) a day. If you have hypertension, you may need to reduce your sodium intake to 1,500 mg a day.  Limit alcohol intake to no more than 1 drink a day for nonpregnant women and 2 drinks a day for men. One drink equals 12 oz of beer, 5 oz of wine, or 1 oz of hard liquor.  Work with your health care provider to maintain a healthy body weight or to lose weight. Ask what an ideal weight is for you.  Get at least 30 minutes of exercise that causes your heart to beat faster (aerobic exercise) most days of the week. Activities may include walking, swimming, or biking.  Work with your health care provider or diet and  nutrition specialist (dietitian) to adjust your eating plan to your individual calorie needs. Reading food labels  Check food labels for the amount of sodium per serving. Choose foods with less than 5 percent of the Daily Value of sodium. Generally, foods with less than 300 mg of sodium per serving fit into this eating plan.  To find whole grains, look for the word "whole" as the first word in the ingredient list. Shopping  Buy products labeled as "low-sodium" or "no salt added."  Buy fresh foods. Avoid canned foods and premade or frozen meals. Cooking  Avoid adding salt when cooking. Use salt-free seasonings or herbs instead of table salt or sea salt. Check with your health care provider or pharmacist before using salt substitutes.  Do not fry foods. Cook foods using healthy methods such as baking, boiling, grilling, and broiling instead.  Cook with heart-healthy oils, such as olive, canola, soybean, or sunflower oil. Meal planning   Eat a balanced diet that includes: ? 5 or more servings of fruits and vegetables each day. At each meal, try to fill half of your plate with fruits and vegetables. ? Up to 6-8 servings of whole grains each day. ? Less than 6 oz of lean meat, poultry, or fish each day. A 3-oz serving of meat is about the same size as a deck of cards. One egg equals 1 oz. ? 2 servings of low-fat dairy each day. ? A serving of nuts, seeds, or beans 5 times each week. ? Heart-healthy fats. Healthy fats  called Omega-3 fatty acids are found in foods such as flaxseeds and coldwater fish, like sardines, salmon, and mackerel.  Limit how much you eat of the following: ? Canned or prepackaged foods. ? Food that is high in trans fat, such as fried foods. ? Food that is high in saturated fat, such as fatty meat. ? Sweets, desserts, sugary drinks, and other foods with added sugar. ? Full-fat dairy products.  Do not salt foods before eating.  Try to eat at least 2 vegetarian  meals each week.  Eat more home-cooked food and less restaurant, buffet, and fast food.  When eating at a restaurant, ask that your food be prepared with less salt or no salt, if possible. What foods are recommended? The items listed may not be a complete list. Talk with your dietitian about what dietary choices are best for you. Grains Whole-grain or whole-wheat bread. Whole-grain or whole-wheat pasta. Brown rice. [place] Morrow. Bulgur. Whole-grain and low-sodium cereals. Pita bread. Low-fat, low-sodium crackers. Whole-wheat flour tortillas. Vegetables Fresh or frozen vegetables (raw, steamed, roasted, or grilled). Low-sodium or reduced-sodium tomato and vegetable juice. Low-sodium or reduced-sodium tomato sauce and tomato paste. Low-sodium or reduced-sodium canned vegetables. Fruits All fresh, dried, or frozen fruit. Canned fruit in natural juice (without added sugar). Meat and other protein foods Skinless chicken or [place]. Ground chicken or [place]. Pork with fat trimmed off. Fish and seafood. Egg whites. Dried beans, peas, or lentils. Unsalted nuts, nut butters, and seeds. Unsalted canned beans. Lean cuts of beef with fat trimmed off. Low-sodium, lean deli meat. Dairy Low-fat (1%) or fat-free (skim) milk. Fat-free, low-fat, or reduced-fat cheeses. Nonfat, low-sodium ricotta or cottage cheese. Low-fat or nonfat yogurt. Low-fat, low-sodium cheese. Fats and oils Soft margarine without trans fats. Vegetable oil. Low-fat, reduced-fat, or light mayonnaise and salad dressings (reduced-sodium). Canola, safflower, olive, soybean, and sunflower oils. Avocado. Seasoning and other foods Herbs. Spices. Seasoning mixes without salt. Unsalted popcorn and pretzels. Fat-free sweets. What foods are not recommended? The items listed may not be a complete list. Talk with your dietitian about what dietary choices are best for you. Grains Baked goods made with fat, such as croissants, muffins, or some  breads. Dry pasta or rice meal packs. Vegetables Creamed or fried vegetables. Vegetables in a cheese sauce. Regular canned vegetables (not low-sodium or reduced-sodium). Regular canned tomato sauce and paste (not low-sodium or reduced-sodium). Regular tomato and vegetable juice (not low-sodium or reduced-sodium). Angie Fava. Olives. Fruits Canned fruit in a light or heavy syrup. Fried fruit. Fruit in cream or butter sauce. Meat and other protein foods Fatty cuts of meat. Ribs. Fried meat. Berniece Salines. Sausage. Bologna and other processed lunch meats. Salami. Fatback. Hotdogs. Bratwurst. Salted nuts and seeds. Canned beans with added salt. Canned or smoked fish. Whole eggs or egg yolks. Chicken or [place] with skin. Dairy Whole or 2% milk, cream, and half-and-half. Whole or full-fat cream cheese. Whole-fat or sweetened yogurt. Full-fat cheese. Nondairy creamers. Whipped toppings. Processed cheese and cheese spreads. Fats and oils Butter. Stick margarine. Lard. Shortening. Ghee. Bacon fat. Tropical oils, such as coconut, palm kernel, or palm oil. Seasoning and other foods Salted popcorn and pretzels. Onion salt, garlic salt, seasoned salt, table salt, and sea salt. Worcestershire sauce. Tartar sauce. Barbecue sauce. Teriyaki sauce. Soy sauce, including reduced-sodium. Steak sauce. Canned and packaged gravies. Fish sauce. Oyster sauce. Cocktail sauce. Horseradish that you find on the shelf. Ketchup. Mustard. Meat flavorings and tenderizers. Bouillon cubes. Hot sauce and Tabasco sauce. Premade or packaged marinades.  Premade or packaged taco seasonings. Relishes. Regular salad dressings. Where to find more information:  National Heart, Lung, and Blood Institute: PopSteam.iswww.nhlbi.nih.gov  American Heart Association: www.heart.org Summary  The DASH eating plan is a healthy eating plan that has been shown to reduce high blood pressure (hypertension). It may also reduce your risk for type 2 diabetes, heart disease, and  stroke.  With the DASH eating plan, you should limit salt (sodium) intake to 2,300 mg a day. If you have hypertension, you may need to reduce your sodium intake to 1,500 mg a day.  When on the DASH eating plan, aim to eat more fresh fruits and vegetables, whole grains, lean proteins, low-fat dairy, and heart-healthy fats.  Work with your health care provider or diet and nutrition specialist (dietitian) to adjust your eating plan to your individual calorie needs. This information is not intended to replace advice given to you by your health care provider. Make sure you discuss any questions you have with your health care provider. Document Released: 05/15/2011 Document Revised: 05/19/2016 Document Reviewed: 05/19/2016 Elsevier Interactive Patient Education  2017 [employer]

## 2017-01-14 ENCOUNTER — Other Ambulatory Visit (HOSPITAL_COMMUNITY): Payer: Self-pay

## 2017-01-22 ENCOUNTER — Telehealth: Payer: Self-pay | Admitting: *Deleted

## 2017-01-22 ENCOUNTER — Encounter: Payer: Self-pay | Admitting: *Deleted

## 2017-01-22 NOTE — Telephone Encounter (Signed)
Phone number on file is not valid and I have attempted to reach him several times this week.  I have sent a letter with his recent labwork results, and request to call.

## 2017-01-22 NOTE — Telephone Encounter (Signed)
-----   Message from Darrol Jumphonda G Barrett, PA-C sent at 01/16/2017  1:45 PM EDT ----- Can you make sure he has insurance? If he does, Dr C wants him to have a CT angio to size his 4.8 cm aneurysm.   Any questions, ask me.   Thanks

## 2017-01-24 ENCOUNTER — Inpatient Hospital Stay (HOSPITAL_COMMUNITY)
Admission: EM | Admit: 2017-01-24 | Discharge: 2017-01-26 | DRG: 641 | Disposition: A | Discharge status: Home / Self Care | Payer: Medicaid Other | Attending: Internal Medicine | Admitting: Internal Medicine

## 2017-01-24 ENCOUNTER — Encounter (HOSPITAL_COMMUNITY): Payer: Self-pay | Admitting: Emergency Medicine

## 2017-01-24 ENCOUNTER — Emergency Department (HOSPITAL_COMMUNITY): Payer: Medicaid Other

## 2017-01-24 DIAGNOSIS — I712 Thoracic aortic aneurysm, without rupture: Secondary | ICD-10-CM | POA: Diagnosis present

## 2017-01-24 DIAGNOSIS — R55 Syncope and collapse: Secondary | ICD-10-CM

## 2017-01-24 DIAGNOSIS — Z79899 Other long term (current) drug therapy: Secondary | ICD-10-CM

## 2017-01-24 DIAGNOSIS — I110 Hypertensive heart disease with heart failure: Secondary | ICD-10-CM | POA: Diagnosis present

## 2017-01-24 DIAGNOSIS — I4819 Other persistent atrial fibrillation: Secondary | ICD-10-CM | POA: Diagnosis present

## 2017-01-24 DIAGNOSIS — I10 Essential (primary) hypertension: Secondary | ICD-10-CM | POA: Diagnosis present

## 2017-01-24 DIAGNOSIS — I959 Hypotension, unspecified: Secondary | ICD-10-CM | POA: Diagnosis present

## 2017-01-24 DIAGNOSIS — I481 Persistent atrial fibrillation: Secondary | ICD-10-CM | POA: Diagnosis present

## 2017-01-24 DIAGNOSIS — Z7901 Long term (current) use of anticoagulants: Secondary | ICD-10-CM

## 2017-01-24 DIAGNOSIS — Z8249 Family history of ischemic heart disease and other diseases of the circulatory system: Secondary | ICD-10-CM

## 2017-01-24 DIAGNOSIS — I5042 Chronic combined systolic (congestive) and diastolic (congestive) heart failure: Secondary | ICD-10-CM | POA: Diagnosis present

## 2017-01-24 DIAGNOSIS — I7121 Aneurysm of the ascending aorta, without rupture: Secondary | ICD-10-CM | POA: Diagnosis present

## 2017-01-24 DIAGNOSIS — R001 Bradycardia, unspecified: Secondary | ICD-10-CM | POA: Diagnosis present

## 2017-01-24 DIAGNOSIS — I513 Intracardiac thrombosis, not elsewhere classified: Secondary | ICD-10-CM | POA: Diagnosis present

## 2017-01-24 DIAGNOSIS — Z8673 Personal history of transient ischemic attack (TIA), and cerebral infarction without residual deficits: Secondary | ICD-10-CM

## 2017-01-24 DIAGNOSIS — I951 Orthostatic hypotension: Secondary | ICD-10-CM | POA: Diagnosis present

## 2017-01-24 DIAGNOSIS — Z7982 Long term (current) use of aspirin: Secondary | ICD-10-CM

## 2017-01-24 DIAGNOSIS — I5022 Chronic systolic (congestive) heart failure: Secondary | ICD-10-CM | POA: Diagnosis present

## 2017-01-24 DIAGNOSIS — E860 Dehydration: Principal | ICD-10-CM | POA: Diagnosis present

## 2017-01-24 HISTORY — DX: Abdominal aortic aneurysm, without rupture, unspecified: I71.40

## 2017-01-24 HISTORY — DX: Abdominal aortic aneurysm, without rupture: I71.4

## 2017-01-24 LAB — I-STAT CHEM 8, ED
BUN: 12 mg/dL (ref 6–20)
CHLORIDE: 98 mmol/L — AB (ref 101–111)
CREATININE: 1.1 mg/dL (ref 0.61–1.24)
Calcium, Ion: 1.16 mmol/L (ref 1.15–1.40)
Glucose, Bld: 109 mg/dL — ABNORMAL HIGH (ref 65–99)
HEMATOCRIT: 40 % (ref 39.0–52.0)
Hemoglobin: 13.6 g/dL (ref 13.0–17.0)
POTASSIUM: 3.6 mmol/L (ref 3.5–5.1)
Sodium: 140 mmol/L (ref 135–145)
TCO2: 31 mmol/L (ref 0–100)

## 2017-01-24 LAB — COMPREHENSIVE METABOLIC PANEL
ALBUMIN: 3.7 g/dL (ref 3.5–5.0)
ALT: 29 U/L (ref 17–63)
ANION GAP: 8 (ref 5–15)
AST: 35 U/L (ref 15–41)
Alkaline Phosphatase: 43 U/L (ref 38–126)
BILIRUBIN TOTAL: 1.1 mg/dL (ref 0.3–1.2)
BUN: 10 mg/dL (ref 6–20)
CO2: 29 mmol/L (ref 22–32)
Calcium: 8.9 mg/dL (ref 8.9–10.3)
Chloride: 101 mmol/L (ref 101–111)
Creatinine, Ser: 1.11 mg/dL (ref 0.61–1.24)
GFR calc Af Amer: >60 mL/min (ref >60)
GFR calc non Af Amer: >60 mL/min (ref >60)
GLUCOSE: 107 mg/dL — AB (ref 65–99)
POTASSIUM: 3.7 mmol/L (ref 3.5–5.1)
SODIUM: 138 mmol/L (ref 135–145)
TOTAL PROTEIN: 6 g/dL — AB (ref 6.5–8.1)

## 2017-01-24 LAB — CBC WITH DIFFERENTIAL/PLATELET
BASOS ABS: 0.1 10*3/uL (ref 0.0–0.1)
BASOS PCT: 1 %
EOS ABS: 0.3 10*3/uL (ref 0.0–0.7)
Eosinophils Relative: 5 %
HEMATOCRIT: 40.6 % (ref 39.0–52.0)
Hemoglobin: 13.7 g/dL (ref 13.0–17.0)
Lymphocytes Relative: 35 %
Lymphs Abs: 2 10*3/uL (ref 0.7–4.0)
MCH: 28.8 pg (ref 26.0–34.0)
MCHC: 33.7 g/dL (ref 30.0–36.0)
MCV: 85.3 fL (ref 78.0–100.0)
MONO ABS: 0.5 10*3/uL (ref 0.1–1.0)
Monocytes Relative: 9 %
NEUTROS ABS: 3 10*3/uL (ref 1.7–7.7)
NEUTROS PCT: 50 %
Platelets: 171 10*3/uL (ref 150–400)
RBC: 4.76 MIL/uL (ref 4.22–5.81)
RDW: 14.3 % (ref 11.5–15.5)
WBC: 5.8 10*3/uL (ref 4.0–10.5)

## 2017-01-24 LAB — I-STAT CG4 LACTIC ACID, ED: Lactic Acid, Venous: 2.18 mmol/L (ref 0.5–1.9)

## 2017-01-24 LAB — DIGOXIN LEVEL: Digoxin Level: 0.4 ng/mL — ABNORMAL LOW (ref 0.8–2.0)

## 2017-01-24 LAB — I-STAT TROPONIN, ED: TROPONIN I, POC: 0.01 ng/mL (ref 0.00–0.08)

## 2017-01-24 LAB — BRAIN NATRIURETIC PEPTIDE: B NATRIURETIC PEPTIDE 5: 55.7 pg/mL (ref 0.0–100.0)

## 2017-01-24 MED ORDER — IOPAMIDOL (ISOVUE-370) INJECTION 76%
INTRAVENOUS | Status: AC
  Administered 2017-01-24: 100 mL
  Filled 2017-01-24: qty 100

## 2017-01-24 MED ORDER — SODIUM CHLORIDE 0.9 % IV SOLN: INTRAVENOUS | Status: DC

## 2017-01-24 NOTE — ED Triage Notes (Addendum)
Pt transported from church event by EMS, pt states he was dizzy before walking into church then after getting into building pt lowered himself to ground and had full syncopal episode. Hx of CVA 45 days ago, no new neuro deficits today, A & O when EMS arrived.  IV est, no injury. Per EMS pt states he has been working in yard and noted he has not been drinking much water.  Pt has known AAA, last measurement 4.9cm

## 2017-01-24 NOTE — ED Notes (Signed)
Pt. To CT via stretcher. 
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
## 2017-01-24 NOTE — ED Notes (Signed)
PT. Returned from CT via stretcher.

## 2017-01-25 ENCOUNTER — Encounter (HOSPITAL_COMMUNITY): Payer: Self-pay | Admitting: Family Medicine

## 2017-01-25 DIAGNOSIS — I951 Orthostatic hypotension: Secondary | ICD-10-CM | POA: Diagnosis present

## 2017-01-25 DIAGNOSIS — Z7901 Long term (current) use of anticoagulants: Secondary | ICD-10-CM | POA: Diagnosis not present

## 2017-01-25 DIAGNOSIS — R55 Syncope and collapse: Secondary | ICD-10-CM | POA: Diagnosis not present

## 2017-01-25 DIAGNOSIS — Z8673 Personal history of transient ischemic attack (TIA), and cerebral infarction without residual deficits: Secondary | ICD-10-CM | POA: Diagnosis not present

## 2017-01-25 DIAGNOSIS — E860 Dehydration: Secondary | ICD-10-CM | POA: Diagnosis not present

## 2017-01-25 DIAGNOSIS — I481 Persistent atrial fibrillation: Secondary | ICD-10-CM

## 2017-01-25 DIAGNOSIS — Z79899 Other long term (current) drug therapy: Secondary | ICD-10-CM | POA: Diagnosis not present

## 2017-01-25 DIAGNOSIS — I959 Hypotension, unspecified: Secondary | ICD-10-CM | POA: Diagnosis present

## 2017-01-25 DIAGNOSIS — I5022 Chronic systolic (congestive) heart failure: Secondary | ICD-10-CM

## 2017-01-25 DIAGNOSIS — I712 Thoracic aortic aneurysm, without rupture: Secondary | ICD-10-CM

## 2017-01-25 DIAGNOSIS — R001 Bradycardia, unspecified: Secondary | ICD-10-CM | POA: Diagnosis present

## 2017-01-25 DIAGNOSIS — I513 Intracardiac thrombosis, not elsewhere classified: Secondary | ICD-10-CM | POA: Diagnosis present

## 2017-01-25 DIAGNOSIS — Z7982 Long term (current) use of aspirin: Secondary | ICD-10-CM | POA: Diagnosis not present

## 2017-01-25 DIAGNOSIS — I110 Hypertensive heart disease with heart failure: Secondary | ICD-10-CM | POA: Diagnosis present

## 2017-01-25 DIAGNOSIS — Z8249 Family history of ischemic heart disease and other diseases of the circulatory system: Secondary | ICD-10-CM | POA: Diagnosis not present

## 2017-01-25 LAB — URINALYSIS, ROUTINE W REFLEX MICROSCOPIC
Bilirubin Urine: NEGATIVE
GLUCOSE, UA: NEGATIVE mg/dL
Hgb urine dipstick: NEGATIVE
KETONES UR: NEGATIVE mg/dL
LEUKOCYTES UA: NEGATIVE
NITRITE: NEGATIVE
PROTEIN: NEGATIVE mg/dL
Specific Gravity, Urine: 1.026 (ref 1.005–1.030)
pH: 6 (ref 5.0–8.0)

## 2017-01-25 LAB — TROPONIN I: Troponin I: <0.03 ng/mL (ref <0.03)

## 2017-01-25 LAB — LACTIC ACID, PLASMA: Lactic Acid, Venous: 1.4 mmol/L (ref 0.5–1.9)

## 2017-01-25 MED ORDER — ATORVASTATIN CALCIUM 40 MG PO TABS
40.0000 mg | ORAL_TABLET | Freq: Every day | ORAL | Status: DC
  Administered 2017-01-25 – 2017-01-26 (×2): 40 mg via ORAL
  Filled 2017-01-25 (×4): qty 1

## 2017-01-25 MED ORDER — ASPIRIN EC 81 MG PO TBEC
81.0000 mg | DELAYED_RELEASE_TABLET | Freq: Every day | ORAL | Status: DC
  Administered 2017-01-25 – 2017-01-26 (×2): 81 mg via ORAL
  Filled 2017-01-25 (×2): qty 1

## 2017-01-25 MED ORDER — SODIUM CHLORIDE 0.9 % IV BOLUS (SEPSIS)
500.0000 mL | Freq: Once | INTRAVENOUS | Status: AC
  Administered 2017-01-25: 500 mL via INTRAVENOUS

## 2017-01-25 MED ORDER — ONDANSETRON HCL 4 MG PO TABS: 4.0000 mg | ORAL_TABLET | Freq: Four times a day (QID) | ORAL | Status: DC | PRN

## 2017-01-25 MED ORDER — ACETAMINOPHEN 325 MG PO TABS: 650.0000 mg | ORAL_TABLET | Freq: Four times a day (QID) | ORAL | Status: DC | PRN

## 2017-01-25 MED ORDER — SODIUM CHLORIDE 0.9% FLUSH
3.0000 mL | Freq: Two times a day (BID) | INTRAVENOUS | Status: DC
  Administered 2017-01-25 – 2017-01-26 (×3): 3 mL via INTRAVENOUS

## 2017-01-25 MED ORDER — SACUBITRIL-VALSARTAN 24-26 MG PO TABS
1.0000 | ORAL_TABLET | Freq: Two times a day (BID) | ORAL | Status: DC
  Administered 2017-01-25 – 2017-01-26 (×2): 1 via ORAL
  Filled 2017-01-25 (×4): qty 1

## 2017-01-25 MED ORDER — ZOLPIDEM TARTRATE 5 MG PO TABS
5.0000 mg | ORAL_TABLET | Freq: Every day | ORAL | Status: DC
Start: 2017-01-25 — End: 2017-01-26
  Administered 2017-01-25: 5 mg via ORAL
  Filled 2017-01-25: qty 1

## 2017-01-25 MED ORDER — POTASSIUM CHLORIDE CRYS ER 20 MEQ PO TBCR
20.0000 meq | EXTENDED_RELEASE_TABLET | Freq: Every day | ORAL | Status: DC
  Administered 2017-01-25: 20 meq via ORAL
  Filled 2017-01-25: qty 1

## 2017-01-25 MED ORDER — ACETAMINOPHEN 650 MG RE SUPP: 650.0000 mg | Freq: Four times a day (QID) | RECTAL | Status: DC | PRN

## 2017-01-25 MED ORDER — DIGOXIN 125 MCG PO TABS
0.1250 mg | ORAL_TABLET | Freq: Every day | ORAL | Status: DC
  Administered 2017-01-25 – 2017-01-26 (×2): 0.125 mg via ORAL
  Filled 2017-01-25 (×3): qty 1

## 2017-01-25 MED ORDER — APIXABAN 5 MG PO TABS
5.0000 mg | ORAL_TABLET | Freq: Two times a day (BID) | ORAL | Status: DC
  Administered 2017-01-25 – 2017-01-26 (×3): 5 mg via ORAL
  Filled 2017-01-25 (×5): qty 1

## 2017-01-25 MED ORDER — ONDANSETRON HCL 4 MG/2ML IJ SOLN: 4.0000 mg | Freq: Four times a day (QID) | INTRAMUSCULAR | Status: DC | PRN

## 2017-01-25 MED ORDER — POTASSIUM CHLORIDE CRYS ER 20 MEQ PO TBCR
40.0000 meq | EXTENDED_RELEASE_TABLET | Freq: Every day | ORAL | Status: DC
  Administered 2017-01-25 – 2017-01-26 (×2): 40 meq via ORAL
  Filled 2017-01-25 (×3): qty 2
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
## 2017-01-25 NOTE — ED Notes (Signed)
Requested hospital bed 

## 2017-01-25 NOTE — Progress Notes (Signed)
[facility]                                                                              Patient Demographics  Caleb [name], is a 56 y.o. male, DOB - [birth year], WEX:[id number]  Admit date - 01/24/2017   Admitting Physician No admitting provider for patient encounter.  Outpatient Primary MD for the patient is Bing Neighbors, FNP  Outpatient specialists:   LOS - 0  days   Medical records reviewed and are as summarized below:    Chief Complaint  Patient presents with  . Loss of Consciousness       Brief summary   The patient is a 56 year old male with CHF EF 20%, AFib on eliquis and thoracic aortic aneurysm followed by Dr. [name] presented with syncope. The patient was in his usual health until this evening, he was going to a meeting at church, standing there talking to someone when he felt his heart "flip flop" once, then a few seconds later he got very dizzy, slowly lowered himself to the ground, and passed out.    Assessment & Plan    Principal Problem:   Syncope - Possible dehydration or vasovagal vs cardiac arrhythmia however patient has a complex cardiac history with cardiomyopathy, EF 20%, LV mural thrombus, atrial fibrillation, thoracic aortic aneurysm, CVA - Lactic acid was 2.18, improved to 1.4 - Troponin 1 negative, EKG atrial fibrillation, rate controlled - Cardiology consulted, recommended Coreg, digoxin, begin entresto  Active Problems:   Essential hypertension - Currently stable, continue Coreg, entresto    Aortic aneurysm, thoracic (HCC) - CT angiogram negative for acute PE ascending thoracic aortic aneurysm measuring 5.5 cm, recommended CT surgery consult as size in the range of surgical repair - Discussed with Dr Tyrone Sage, recommended to send a message to CT surgery team for Dr. [name] to follow up in a.m. this is usually done electively at 5.5 cm if low risk.    Persistent atrial fibrillation (HCC) - Currently rate controlled,  continue Coreg - Continue Eliquis for anticoagulation  History of CVA - No residual weakness, continue eliquis    Chronic systolic CHF (congestive heart failure) (HCC), history of idiopathic cardiomyopathy - Recent 2-D echo 12/2016 showed EF of 20%, diffuse hypokinesis  -Cardiology following    Left ventricular mural thrombus - continue eliquis    Code Status: full  DVT Prophylaxis: eliquis  Family Communication: Discussed in detail with the patient, all imaging results, lab results explained to the patient   Disposition Plan:   Time Spent in minutes   25 minutes  Procedures:    Consultants:   Cardiology  Antimicrobials:      Medications  Scheduled Meds: . apixaban  5 mg Oral BID  . aspirin EC  81 mg Oral Daily  . atorvastatin  40 mg Oral Daily  . digoxin  0.125 mg Oral Daily  . potassium chloride  40 mEq Oral Daily  . sacubitril-valsartan  1 tablet Oral BID  . sodium chloride flush  3 mL Intravenous Q12H   Continuous Infusions: PRN Meds:.acetaminophen **OR** acetaminophen, ondansetron **OR** ondansetron (ZOFRAN) IV   Antibiotics   Anti-infectives  None        Subjective:   Caleb [name] was seen and examined today.  Feels a lot better today, denies any specific complaints. No dizziness, chest pain or shortness of breath. Patient denies abdominal pain, N/V/D/C, new weakness, numbess, tingling.   Objective:   Vitals:   01/25/17 0915 01/25/17 1000 01/25/17 1003 01/25/17 1045  BP: 117/73 (!) 110/94 (!) 110/94 (!) 125/92  Pulse: (!) 54 87 74 (!) 41  Resp: 16  16 16   Temp:      TempSrc:      SpO2: 99% 100% 97% 97%    Intake/Output Summary (Last 24 hours) at 01/25/17 1142 Last data filed at 01/25/17 1011  Gross per 24 hour  Intake              503 ml  Output              350 ml  Net              153 ml     Wt Readings from Last 3 Encounters:  01/13/17 127.5 kg (281 lb)  01/13/17 127 kg (280 lb)  12/24/16 57.7 kg (127 lb 4.8 oz)      Exam  General: Alert and oriented x 3, NAD  Eyes: PERRLA, EOMI, Anicteric Sclera,  HEENT:  Atraumatic, normocephalic, normal oropharynx  Cardiovascular: S1 S2 auscultated, no rubs, murmurs or gallops. Regular rate and rhythm.  Respiratory: Clear to auscultation bilaterally, no wheezing, rales or rhonchi  Gastrointestinal: Soft, nontender, nondistended, + bowel sounds  Ext: no pedal edema bilaterally  Neuro: AAOx3, Cr N's II- XII. Strength 5/5 upper and lower extremities bilaterally, speech clear, sensations grossly intact  Musculoskeletal: No digital cyanosis, clubbing  Skin: No rashes  Psych: Normal affect and demeanor, alert and oriented x3    Data Reviewed:  I have personally reviewed following labs and imaging studies  Micro Results No results found for this or any previous visit (from the past 240 hour(s)).  Radiology Reports Dg Chest 2 View  Result Date: 01/24/2017 CLINICAL DATA:  Acute onset of dizziness. Syncope. Initial encounter. EXAM: CHEST  2 VIEW COMPARISON:  Chest radiograph performed 12/23/2016 FINDINGS: The lungs are well-aerated and clear. There is no evidence of focal opacification, pleural effusion or pneumothorax. The heart is mildly enlarged. No acute osseous abnormalities are seen. IMPRESSION: Mild cardiomegaly.  Lungs remain grossly clear. Electronically Signed   By: Roanna Raider M.D.   On: 01/24/2017 22:57   Ct Head Wo Contrast  Result Date: 01/24/2017 CLINICAL DATA:  Syncope EXAM: CT HEAD WITHOUT CONTRAST TECHNIQUE: Contiguous axial images were obtained from the base of the skull through the vertex without intravenous contrast. COMPARISON:  12/24/2016, 12/24/2015 FINDINGS: Brain: No acute territorial infarction, hemorrhage, or intracranial mass is seen. Encephalomalacia within the left frontal lobe and insula. Mild small vessel ischemic changes of the white matter. Mild atrophy. Stable ventricle size. Vascular: No hyperdense vessels.  Carotid  artery calcification. Skull: No fracture or suspicious bone lesion Sinuses/Orbits: Minimal mucosal thickening in the ethmoid sinuses. No acute orbital abnormality. Other: None IMPRESSION: 1. No definite CT evidence for acute intracranial abnormality 2. Old left frontal and insular infarct as before. Electronically Signed   By: Jasmine Pang M.D.   On: 01/24/2017 23:25   Ct Angio Chest Aorta W And/or Wo Contrast  Result Date: 01/24/2017 CLINICAL DATA:  Syncopal episode EXAM: CT ANGIOGRAPHY CHEST WITH CONTRAST TECHNIQUE: Multidetector CT imaging of the chest was performed using the standard  protocol during bolus administration of intravenous contrast. Multiplanar CT image reconstructions and MIPs were obtained to evaluate the vascular anatomy. CONTRAST:  100 mL Isovue 370 intravenous COMPARISON:  Radiograph 01/24/2017 FINDINGS: Cardiovascular: Satisfactory opacification of the pulmonary arteries to the segmental level. No evidence of pulmonary embolism. Ascending thoracic aortic aneurysm measuring 5.5 cm in maximum diameter. No dissection is seen. Cardiomegaly. No pericardial effusion. Mediastinum/Nodes: Subcentimeter hypodense nodules right lobe of thyroid. Midline trachea. No enlarged mediastinal lymph nodes. Esophagus within normal limits. Lungs/Pleura: Lungs are clear. No pleural effusion or pneumothorax. Upper Abdomen: No acute abnormality. Musculoskeletal: No acute or suspicious bone lesion. Degenerative changes of the thoracic spine. Partial fusion at T6-T7. Old right-sided rib fractures. Review of the MIP images confirms the above findings. IMPRESSION: 1. Negative for acute pulmonary embolus. 2. Ascending thoracic aortic aneurysm measuring 5.5 cm in maximum diameter. Recommend semi-annual imaging followup by CTA or MRA and referral to cardiothoracic surgery if not already obtained. This recommendation follows 2010 ACCF/AHA/AATS/ACR/ASA/SCA/SCAI/SIR/STS/SVM Guidelines for the Diagnosis and Management of  Patients With Thoracic Aortic Disease. Circulation. 2010; 121: [id number]. Negative for dissection Electronically Signed   By: Jasmine Pang M.D.   On: 01/24/2017 23:35    Lab Data:  CBC:  Recent Labs Lab 01/24/17 2120 01/24/17 2141  WBC 5.8  --   NEUTROABS 3.0  --   HGB 13.7 13.6  HCT 40.6 40.0  MCV 85.3  --   PLT 171  --    Basic Metabolic Panel:  Recent Labs Lab 01/24/17 2120 01/24/17 2141  NA 138 140  K 3.7 3.6  CL 101 98*  CO2 29  --   GLUCOSE 107* 109*  BUN 10 12  CREATININE 1.11 1.10  CALCIUM 8.9  --    GFR: Estimated Creatinine Clearance: 106.6 mL/min (by C-G formula based on SCr of 1.1 mg/dL). Liver Function Tests:  Recent Labs Lab 01/24/17 2120  AST 35  ALT 29  ALKPHOS 43  BILITOT 1.1  PROT 6.0*  ALBUMIN 3.7   No results for input(s): LIPASE, AMYLASE in the last 168 hours. No results for input(s): AMMONIA in the last 168 hours. Coagulation Profile: No results for input(s): INR, PROTIME in the last 168 hours. Cardiac Enzymes:  Recent Labs Lab 01/25/17 0512  TROPONINI <0.03   BNP (last 3 results) No results for input(s): PROBNP in the last 8760 hours. HbA1C: No results for input(s): HGBA1C in the last 72 hours. CBG: No results for input(s): GLUCAP in the last 168 hours. Lipid Profile: No results for input(s): CHOL, HDL, LDLCALC, TRIG, CHOLHDL, LDLDIRECT in the last 72 hours. Thyroid Function Tests: No results for input(s): TSH, T4TOTAL, FREET4, T3FREE, THYROIDAB in the last 72 hours. Anemia Panel: No results for input(s): VITAMINB12, FOLATE, FERRITIN, TIBC, IRON, RETICCTPCT in the last 72 hours. Urine analysis:    Component Value Date/Time   COLORURINE YELLOW 01/24/2017 2139   APPEARANCEUR CLEAR 01/24/2017 2139   LABSPEC 1.026 01/24/2017 2139   PHURINE 6.0 01/24/2017 2139   GLUCOSEU NEGATIVE 01/24/2017 2139   HGBUR NEGATIVE 01/24/2017 2139   BILIRUBINUR NEGATIVE 01/24/2017 2139   KETONESUR NEGATIVE 01/24/2017 2139   PROTEINUR  NEGATIVE 01/24/2017 2139   UROBILINOGEN 0.2 01/13/2017 1352   NITRITE NEGATIVE 01/24/2017 2139   LEUKOCYTESUR NEGATIVE 01/24/2017 2139     Inice Sanluis M.D. [facility] 01/25/2017, 11:42 AM  Pager: [phone] Between 7am to 7pm - call Pager - [phone]  After 7pm go to [email or web address] - password [email or web address]  Call night coverage person covering after  7pm
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
## 2017-01-26 ENCOUNTER — Encounter: Payer: Self-pay | Admitting: Surgery

## 2017-01-26 ENCOUNTER — Telehealth: Payer: Self-pay | Admitting: Cardiovascular Disease

## 2017-01-26 ENCOUNTER — Encounter: Payer: Self-pay | Admitting: Physician Assistant

## 2017-01-26 DIAGNOSIS — I4891 Unspecified atrial fibrillation: Secondary | ICD-10-CM

## 2017-01-26 DIAGNOSIS — I10 Essential (primary) hypertension: Secondary | ICD-10-CM

## 2017-01-26 DIAGNOSIS — I712 Thoracic aortic aneurysm, without rupture: Secondary | ICD-10-CM

## 2017-01-26 LAB — CBC
HCT: 43.7 % (ref 39.0–52.0)
Hemoglobin: 14.2 g/dL (ref 13.0–17.0)
MCH: 27.6 pg (ref 26.0–34.0)
MCHC: 32.5 g/dL (ref 30.0–36.0)
MCV: 85 fL (ref 78.0–100.0)
Platelets: 159 10*3/uL (ref 150–400)
RBC: 5.14 MIL/uL (ref 4.22–5.81)
RDW: 14.6 % (ref 11.5–15.5)
WBC: 6.2 10*3/uL (ref 4.0–10.5)

## 2017-01-26 LAB — BASIC METABOLIC PANEL
Anion gap: 6 (ref 5–15)
BUN: 8 mg/dL (ref 6–20)
CALCIUM: 8.5 mg/dL — AB (ref 8.9–10.3)
CO2: 28 mmol/L (ref 22–32)
CREATININE: 1.03 mg/dL (ref 0.61–1.24)
Chloride: 107 mmol/L (ref 101–111)
GFR calc non Af Amer: >60 mL/min (ref >60)
Glucose, Bld: 100 mg/dL — ABNORMAL HIGH (ref 65–99)
Potassium: 4.3 mmol/L (ref 3.5–5.1)
SODIUM: 141 mmol/L (ref 135–145)

## 2017-01-26 LAB — TROPONIN I

## 2017-01-26 MED ORDER — DIGOXIN 125 MCG PO TABS: 0.1250 mg | ORAL_TABLET | Freq: Every day | ORAL | 3 refills | Status: DC

## 2017-01-26 MED ORDER — CARVEDILOL 3.125 MG PO TABS: 3.1250 mg | ORAL_TABLET | Freq: Two times a day (BID) | ORAL | 2 refills | Status: DC

## 2017-01-26 MED ORDER — SACUBITRIL-VALSARTAN 24-26 MG PO TABS: 1.0000 | ORAL_TABLET | Freq: Two times a day (BID) | ORAL | 3 refills | Status: DC

## 2017-01-26 MED ORDER — CARVEDILOL 3.125 MG PO TABS: 3.1250 mg | ORAL_TABLET | Freq: Two times a day (BID) | ORAL | Status: DC

## 2017-01-26 MED ORDER — FUROSEMIDE 40 MG PO TABS: 20.0000 mg | ORAL_TABLET | Freq: Every day | ORAL | 1 refills | Status: DC

## 2017-01-26 MED ORDER — FUROSEMIDE 20 MG PO TABS
20.0000 mg | ORAL_TABLET | Freq: Every day | ORAL | Status: DC
  Administered 2017-01-26: 20 mg via ORAL
  Filled 2017-01-26: qty 1

## 2017-01-26 NOTE — Telephone Encounter (Signed)
Pt needs the coupon foi the first month of his Entresto,if you have them please. She wants to pick this up today,she needs to go out of town.

## 2017-01-26 NOTE — Progress Notes (Addendum)
Progress Note  Patient Name: Caleb [name] Date of Encounter: 01/26/2017  Primary Cardiologist: Dr. Royann Shivers  Subjective   No complaints of chest pain, SOB or palpitations  Inpatient Medications    Scheduled Meds: . apixaban  5 mg Oral BID  . aspirin EC  81 mg Oral Daily  . atorvastatin  40 mg Oral Daily  . digoxin  0.125 mg Oral Daily  . potassium chloride  40 mEq Oral Daily  . sacubitril-valsartan  1 tablet Oral BID  . sodium chloride flush  3 mL Intravenous Q12H  . zolpidem  5 mg Oral QHS   Continuous Infusions:  PRN Meds: acetaminophen **OR** acetaminophen, ondansetron **OR** ondansetron (ZOFRAN) IV   Vital Signs    Vitals:   01/25/17 2040 01/26/17 0500 01/26/17 0807 01/26/17 0813  BP: (!) 133/95 124/90  (!) 123/95  Pulse: 84 67 82   Resp: 18 18    Temp: 98.7 F (37.1 C) (!) 97.5 F (36.4 C)    TempSrc:  Oral    SpO2: 100% 98%    Weight:  281 lb 6.4 oz (127.6 kg)    Height:        Intake/Output Summary (Last 24 hours) at 01/26/17 0826 Last data filed at 01/26/17 0734  Gross per 24 hour  Intake              773 ml  Output             1375 ml  Net             -602 ml   Filed Weights   01/26/17 0500  Weight: 281 lb 6.4 oz (127.6 kg)    Telemetry    Atrial fibrillation with CVR - Personally Reviewed  ECG    No new EKG to review - Personally Reviewed  Physical Exam   GEN: No acute distress.   Neck: No JVD Cardiac: irregularly irregular, no murmurs, rubs, or gallops.  Respiratory: Clear to auscultation bilaterally. GI: Soft, nontender, non-distended  MS: No edema; No deformity. Neuro:  Nonfocal  Psych: Normal affect   Labs    Chemistry Recent Labs Lab 01/24/17 2120 01/24/17 2141 01/26/17 0216  NA 138 140 141  K 3.7 3.6 4.3  CL 101 98* 107  CO2 29  --  28  GLUCOSE 107* 109* 100*  BUN 10 12 8   CREATININE 1.11 1.10 1.03  CALCIUM 8.9  --  8.5*  PROT 6.0*  --   --   ALBUMIN 3.7  --   --   AST 35  --   --   ALT 29  --   --     ALKPHOS 43  --   --   BILITOT 1.1  --   --   GFRNONAA >60  --  >60  GFRAA >60  --  >60  ANIONGAP 8  --  6     Hematology Recent Labs Lab 01/24/17 2120 01/24/17 2141 01/26/17 0216  WBC 5.8  --  6.2  RBC 4.76  --  5.14  HGB 13.7 13.6 14.2  HCT 40.6 40.0 43.7  MCV 85.3  --  85.0  MCH 28.8  --  27.6  MCHC 33.7  --  32.5  RDW 14.3  --  14.6  PLT 171  --  159    Cardiac Enzymes Recent Labs Lab 01/25/17 0512 01/25/17 1225 01/25/17 1819 01/25/17 2331  TROPONINI <0.03 <0.03 <0.03 <0.03    Recent Labs Lab 01/24/17 2144  TROPIPOC 0.01     BNP Recent Labs Lab 01/24/17 2138  BNP 55.7     DDimer No results for input(s): DDIMER in the last 168 hours.   Radiology    Dg Chest 2 View  Result Date: 01/24/2017 CLINICAL DATA:  Acute onset of dizziness. Syncope. Initial encounter. EXAM: CHEST  2 VIEW COMPARISON:  Chest radiograph performed 12/23/2016 FINDINGS: The lungs are well-aerated and clear. There is no evidence of focal opacification, pleural effusion or pneumothorax. The heart is mildly enlarged. No acute osseous abnormalities are seen. IMPRESSION: Mild cardiomegaly.  Lungs remain grossly clear. Electronically Signed   By: Roanna Raider M.D.   On: 01/24/2017 22:57   Ct Head Wo Contrast  Result Date: 01/24/2017 CLINICAL DATA:  Syncope EXAM: CT HEAD WITHOUT CONTRAST TECHNIQUE: Contiguous axial images were obtained from the base of the skull through the vertex without intravenous contrast. COMPARISON:  12/24/2016, 12/24/2015 FINDINGS: Brain: No acute territorial infarction, hemorrhage, or intracranial mass is seen. Encephalomalacia within the left frontal lobe and insula. Mild small vessel ischemic changes of the white matter. Mild atrophy. Stable ventricle size. Vascular: No hyperdense vessels.  Carotid artery calcification. Skull: No fracture or suspicious bone lesion Sinuses/Orbits: Minimal mucosal thickening in the ethmoid sinuses. No acute orbital abnormality. Other:  None IMPRESSION: 1. No definite CT evidence for acute intracranial abnormality 2. Old left frontal and insular infarct as before. Electronically Signed   By: Jasmine Pang M.D.   On: 01/24/2017 23:25   Ct Angio Chest Aorta W And/or Wo Contrast  Result Date: 01/24/2017 CLINICAL DATA:  Syncopal episode EXAM: CT ANGIOGRAPHY CHEST WITH CONTRAST TECHNIQUE: Multidetector CT imaging of the chest was performed using the standard protocol during bolus administration of intravenous contrast. Multiplanar CT image reconstructions and MIPs were obtained to evaluate the vascular anatomy. CONTRAST:  100 mL Isovue 370 intravenous COMPARISON:  Radiograph 01/24/2017 FINDINGS: Cardiovascular: Satisfactory opacification of the pulmonary arteries to the segmental level. No evidence of pulmonary embolism. Ascending thoracic aortic aneurysm measuring 5.5 cm in maximum diameter. No dissection is seen. Cardiomegaly. No pericardial effusion. Mediastinum/Nodes: Subcentimeter hypodense nodules right lobe of thyroid. Midline trachea. No enlarged mediastinal lymph nodes. Esophagus within normal limits. Lungs/Pleura: Lungs are clear. No pleural effusion or pneumothorax. Upper Abdomen: No acute abnormality. Musculoskeletal: No acute or suspicious bone lesion. Degenerative changes of the thoracic spine. Partial fusion at T6-T7. Old right-sided rib fractures. Review of the MIP images confirms the above findings. IMPRESSION: 1. Negative for acute pulmonary embolus. 2. Ascending thoracic aortic aneurysm measuring 5.5 cm in maximum diameter. Recommend semi-annual imaging followup by CTA or MRA and referral to cardiothoracic surgery if not already obtained. This recommendation follows 2010 ACCF/AHA/AATS/ACR/ASA/SCA/SCAI/SIR/STS/SVM Guidelines for the Diagnosis and Management of Patients With Thoracic Aortic Disease. Circulation. 2010; 121: e266-e369TAA. Negative for dissection Electronically Signed   By: Jasmine Pang M.D.   On: 01/24/2017 23:35     Cardiac Studies   2D echo 12/2016 Study Conclusions  - Left ventricle: The cavity size was moderately dilated. Wall   thickness was increased in a pattern of moderate LVH. Systolic   function was normal. The estimated ejection fraction was 20%.   Diffuse hypokinesis. Cannot exclude apical thrombus. - Aortic valve: Moderately calcified annulus. Bicuspid; moderately   thickened, moderately calcified leaflets; fusion of the   right-left coronary commissure. Valve mobility was restricted.   Transvalvular velocity was within the normal range. There was no   stenosis. There was moderate regurgitation. - Aorta: Ascending aortic diameter: 4.98 mm (  S). - Ascending aorta: The ascending aorta was severely dilated. - Mitral valve: There was mild regurgitation directed posteriorly. - Left atrium: The atrium was severely dilated. - Right ventricle: The cavity size was moderately dilated. Wall   thickness was normal. Systolic function was normal. - Right atrium: The atrium was severely dilated. - Atrial septum: No defect or patent foramen ovale was identified   by color flow Doppler. - Tricuspid valve: There was no regurgitation. - Pericardium, extracardiac: A trivial pericardial effusion was   identified.  Impressions:  - Aortic valve pressure half time is , indicating mild   regurgitation. However, the aortic regurgitation appears   moderate. Cannot rule out apical thrombus. Consider limited echo   with Definity contrast to better evaluate.  Patient Profile     56 y.o. male with a hx of permanent atrial fib on Apixaban, CHADS VASC Score of 4, CVA, HTN, Thoracic Aortic Aneurysm 5.5cm by chest CT recently, Systolic Dysfunction (EF of 20%), left mural thrombus, hx of medical noncompliance, with inconsistency in taking medications regularly,  who is being seen for the evaluation of syncope.    Assessment & Plan    1.Syncopal Episode: The patient did not feel any chest pain or  dyspnea associated pre-or post syncopal episode. He did feel some vague palpitations while sitting in his car and a reclining position while waiting on friends. Apparently it was very hot at the time and he had been reclining in the car and then stood up to walk and go lightheaded and passed out.   No evidence of aortic dissection on Chest CT but ascending thoracic aneurysm at 5.5cm.   - symptoms sound more c/w neurocardiogenic or orthostatic in etiology. The vague palpitations he had were likely rebound tachycardia from hypotension. - recent 2D echo with bicuspid AV but no significant AS.  There was moderate AR. - I do not think EP consult is warranted at this time as his symptoms are most c/w orthostatic hypotension in setting of dehydration from sitting in very hot car and then standing up fast.  2. Newly diagnosed idiopathic cardiomyopathy: Significantly reduced ejection fraction with EF of 20% per recent echo in July 2018. The patient has a history of medical noncompliance, and was being considered for ICD implantation one followed by Dr. Royann Shivers  on next appointment if he remained compliant with medications. The patient has been on carvedilol, digoxin, losartan along with digoxin but he ran out one day ago.  - restarted on Dig, Carvedilol and started on Entresto. - follow renal function closely as started Entresto.  3. Thoracic aortic aneurysm in setting of bicuspid AV and moderate AR: The patient did have a repeat chest CT to evaluate for dissection. Size of aneurysm is 5.5 cm. Per Dr.Croituro's recent note, consideration for repair if size increased to 5.5 cm.  - he has been seen in the past by Dr. [name] so will consult CVTS. - he has been weight lifting at home which I instructed him to no longer do - continue statin - needs aggressive BP control - DBP running mildly elevated  4. Persistant atrial fibrillation: Currently heart rate is controlled. He denies any bleeding hemoptysis or  melena - continue Eliquis 5mg  BID.  5. History of CVA: No focal weaknesses but does have some issues with speech slowing.  6. History of left ventricular mural thrombus: Remains on anticoagulation with ELIQUIS.  7.  Chronic systolic CHF - he appears euvolemic on exam today.   - Lasix on  hold due to recent syncope and possible orthostasis.   - will restart lasix at 20mg  daily - continue Carvedilol, dig and start Entresto  I have spent a total of 40 minutes with patient reviewing office notes , telemetry, EKGs, labs and examining patient as well as establishing an assessment and plan that was discussed with the patient.  > 50% of time was spent in direct patient care.    Signed, Armanda Magic, MD  01/26/2017, 8:26 AM

## 2017-01-26 NOTE — Telephone Encounter (Signed)
Returned the call to the patient's sister with no answer. She is not listed on the dpr.

## 2017-01-26 NOTE — Discharge Summary (Signed)
Physician Discharge Summary   Patient ID: Caleb [name] MRN: [id number] DOB/AGE: [birth year] 56 y.o.  Admit date: 01/24/2017 Discharge date: 01/26/2017  Primary Care Physician:  Bing Neighbors, FNP  Discharge Diagnoses:    . Syncope . Aortic aneurysm, thoracic (HCC) . Essential hypertension . Persistent atrial fibrillation (HCC) . Chronic systolic CHF (congestive heart failure) (HCC)   Consults:   Cardiology Cardiothoracic surgery  Recommendations for Outpatient Follow-up:   1. Please repeat CBC/BMET at next visit 2. Losartan discontinued, entresto started by cardiology     DIET: heart healthy     Allergies:  No Known Allergies   DISCHARGE MEDICATIONS: Current Discharge Medication List    CONTINUE these medications which have CHANGED   Details  furosemide (LASIX) 40 MG tablet Take 0.5 tablets (20 mg total) by mouth daily. Qty: 30 tablet, Refills: 1    sacubitril-valsartan (ENTRESTO) 24-26 MG Take 1 tablet by mouth 2 (two) times daily. Qty: 60 tablet, Refills: 3      CONTINUE these medications which have NOT CHANGED   Details  apixaban (ELIQUIS) 5 MG TABS tablet Take 5 mg by mouth 2 (two) times daily.    aspirin EC 81 MG EC tablet Take 1 tablet (81 mg total) by mouth daily. Qty: 30 tablet, Refills: 1    atorvastatin (LIPITOR) 40 MG tablet Take 1 tablet (40 mg total) by mouth daily. Qty: 30 tablet, Refills: 1    carvedilol (COREG) 3.125 MG tablet Take 1 tablet (3.125 mg total) by mouth 2 (two) times daily with a meal. Qty: 60 tablet, Refills: 1    Cholecalciferol (VITAMIN D PO) Take 1 tablet by mouth daily.    digoxin (LANOXIN) 0.125 MG tablet Take 1 tablet (0.125 mg total) by mouth daily. Qty: 30 tablet, Refills: 1    Magnesium 500 MG TABS Take 1,000 mg by mouth daily.    potassium chloride SA (K-DUR,KLOR-CON) 20 MEQ tablet Take 1 tablet (20 mEq total) by mouth daily. Qty: 30 tablet, Refills: 0    zinc sulfate (ZINC-220) 220 (50 Zn) MG capsule  Take 440 mg by mouth daily.      STOP taking these medications     losartan (COZAAR) 25 MG tablet          Brief H and P: For complete details please refer to admission H and P, but in brief The patient is a 56 year old male with CHF EF 20%, AFib on eliquis and thoracic aortic aneurysm followed by Dr. [name] presented with syncope. The patient was in his usual health until this evening, he was going to a meeting at church, standing there talking to someone when he felt his heart "flip flop" once, then a few seconds later he got very dizzy, slowly lowered himself to the ground, and passed out.   Hospital Course:    Syncope - Possible dehydration or vasovagal vs cardiac arrhythmia however patient has a complex cardiac history with cardiomyopathy, EF 20%, LV mural thrombus, atrial fibrillation, thoracic aortic aneurysm, CVA - Lactic acid was 2.18, improved to 1.4 after gentle hydration  - Troponin 1 negative, EKG atrial fibrillation, rate controlled - Cardiology was consulted, recommended Coreg, digoxin, begin entresto - Per cardiology, EP consult is not warranted as his symptoms are consistent with orthostatic hypotension and dehydration from sitting in hot car and then standing up too fast.      Essential hypertension - Currently stable, continue Coreg, entresto    Aortic aneurysm, thoracic (HCC) - CT angiogram negative for acute PE  ascending thoracic aortic aneurysm measuring 5.5 cm, recommended CT surgery consult as size in the range of surgical repair - CT surgery was consulted. Patient was seen by Dr [name] and recommended outpatient follow-up. Please see note from Dr [name] for full recommendations.     Persistent atrial fibrillation (HCC) - Currently rate controlled, continue Coreg - Continue Eliquis for anticoagulation  History of CVA - No residual weakness, continue eliquis    Chronic systolic CHF (congestive heart failure) (HCC), history of idiopathic  cardiomyopathy - Recent 2-D echo 12/2016 showed EF of 20%, diffuse hypokinesis  -Cardiology was consulted. - Patient has been on coreg, digoxin, losartan. Cardiology recommending starting entresto, continue coreg, digoxin.    Left ventricular mural thrombus - continue eliquis   Day of Discharge BP (!) 123/95   Pulse 82   Temp (!) 97.5 F (36.4 C) (Oral)   Resp 18   Ht 6\' 3"  (1.905 m)   Wt 127.6 kg (281 lb 6.4 oz)   SpO2 98%   BMI 35.17 kg/m   Physical Exam: General: Alert and awake oriented x3 not in any acute distress. HEENT: anicteric sclera, pupils reactive to light and accommodation CVS: S1-S2 clear no murmur rubs or gallops Chest: clear to auscultation bilaterally, no wheezing rales or rhonchi Abdomen: soft nontender, nondistended, normal bowel sounds Extremities: no cyanosis, clubbing or edema noted bilaterally Neuro: Cranial nerves II-XII intact, no focal neurological deficits   The results of significant diagnostics from this hospitalization (including imaging, microbiology, ancillary and laboratory) are listed below for reference.    LAB RESULTS: Basic Metabolic Panel:  Recent Labs Lab 01/24/17 2120 01/24/17 2141 01/26/17 0216  NA 138 140 141  K 3.7 3.6 4.3  CL 101 98* 107  CO2 29  --  28  GLUCOSE 107* 109* 100*  BUN 10 12 8   CREATININE 1.11 1.10 1.03  CALCIUM 8.9  --  8.5*   Liver Function Tests:  Recent Labs Lab 01/24/17 2120  AST 35  ALT 29  ALKPHOS 43  BILITOT 1.1  PROT 6.0*  ALBUMIN 3.7   No results for input(s): LIPASE, AMYLASE in the last 168 hours. No results for input(s): AMMONIA in the last 168 hours. CBC:  Recent Labs Lab 01/24/17 2120 01/24/17 2141 01/26/17 0216  WBC 5.8  --  6.2  NEUTROABS 3.0  --   --   HGB 13.7 13.6 14.2  HCT 40.6 40.0 43.7  MCV 85.3  --  85.0  PLT 171  --  159   Cardiac Enzymes:  Recent Labs Lab 01/25/17 1819 01/25/17 2331  TROPONINI <0.03 <0.03   BNP: Invalid input(s): POCBNP CBG: No  results for input(s): GLUCAP in the last 168 hours.  Significant Diagnostic Studies:  Dg Chest 2 View  Result Date: 01/24/2017 CLINICAL DATA:  Acute onset of dizziness. Syncope. Initial encounter. EXAM: CHEST  2 VIEW COMPARISON:  Chest radiograph performed 12/23/2016 FINDINGS: The lungs are well-aerated and clear. There is no evidence of focal opacification, pleural effusion or pneumothorax. The heart is mildly enlarged. No acute osseous abnormalities are seen. IMPRESSION: Mild cardiomegaly.  Lungs remain grossly clear. Electronically Signed   By: Roanna Raider M.D.   On: 01/24/2017 22:57   Ct Head Wo Contrast  Result Date: 01/24/2017 CLINICAL DATA:  Syncope EXAM: CT HEAD WITHOUT CONTRAST TECHNIQUE: Contiguous axial images were obtained from the base of the skull through the vertex without intravenous contrast. COMPARISON:  12/24/2016, 12/24/2015 FINDINGS: Brain: No acute territorial infarction, hemorrhage, or intracranial mass is  seen. Encephalomalacia within the left frontal lobe and insula. Mild small vessel ischemic changes of the white matter. Mild atrophy. Stable ventricle size. Vascular: No hyperdense vessels.  Carotid artery calcification. Skull: No fracture or suspicious bone lesion Sinuses/Orbits: Minimal mucosal thickening in the ethmoid sinuses. No acute orbital abnormality. Other: None IMPRESSION: 1. No definite CT evidence for acute intracranial abnormality 2. Old left frontal and insular infarct as before. Electronically Signed   By: Jasmine Pang M.D.   On: 01/24/2017 23:25   Ct Angio Chest Aorta W And/or Wo Contrast  Result Date: 01/24/2017 CLINICAL DATA:  Syncopal episode EXAM: CT ANGIOGRAPHY CHEST WITH CONTRAST TECHNIQUE: Multidetector CT imaging of the chest was performed using the standard protocol during bolus administration of intravenous contrast. Multiplanar CT image reconstructions and MIPs were obtained to evaluate the vascular anatomy. CONTRAST:  100 mL Isovue 370  intravenous COMPARISON:  Radiograph 01/24/2017 FINDINGS: Cardiovascular: Satisfactory opacification of the pulmonary arteries to the segmental level. No evidence of pulmonary embolism. Ascending thoracic aortic aneurysm measuring 5.5 cm in maximum diameter. No dissection is seen. Cardiomegaly. No pericardial effusion. Mediastinum/Nodes: Subcentimeter hypodense nodules right lobe of thyroid. Midline trachea. No enlarged mediastinal lymph nodes. Esophagus within normal limits. Lungs/Pleura: Lungs are clear. No pleural effusion or pneumothorax. Upper Abdomen: No acute abnormality. Musculoskeletal: No acute or suspicious bone lesion. Degenerative changes of the thoracic spine. Partial fusion at T6-T7. Old right-sided rib fractures. Review of the MIP images confirms the above findings. IMPRESSION: 1. Negative for acute pulmonary embolus. 2. Ascending thoracic aortic aneurysm measuring 5.5 cm in maximum diameter. Recommend semi-annual imaging followup by CTA or MRA and referral to cardiothoracic surgery if not already obtained. This recommendation follows 2010 ACCF/AHA/AATS/ACR/ASA/SCA/SCAI/SIR/STS/SVM Guidelines for the Diagnosis and Management of Patients With Thoracic Aortic Disease. Circulation. 2010; 121: e266-e369TAA. Negative for dissection Electronically Signed   By: Jasmine Pang M.D.   On: 01/24/2017 23:35    2D ECHO:   Disposition and Follow-up:    DISPOSITION: home    DISCHARGE FOLLOW-UP Follow-up Information    Alleen Borne, MD. Schedule an appointment as soon as possible for a visit in 2 week(s).   Specialty:  Cardiothoracic Surgery Contact information: [address] [place] [address] [phone]        Croitoru, Rachelle Hora, MD. Schedule an appointment as soon as possible for a visit in 2 week(s).   Specialty:  Cardiology Contact information: [address] [place] [address] [phone]        Bing Neighbors, FNP. Schedule an  appointment as soon as possible for a visit in 2 week(s).   Specialty:  Family Medicine Contact information: [address] [place] [address] [phone]            Time spent on Discharge:   Signed:   Thad Ranger M.D. [facility] 01/26/2017, 2:56 PM Pager: [phone]
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
## 2017-01-26 NOTE — Progress Notes (Signed)
RN notified from [facility] of Pt in Afib with a 2.01 second pause at 0410. Will continue to monitor.

## 2017-01-26 NOTE — Telephone Encounter (Signed)
The free month Entresto card has been left at the front for the patient to pick up.

## 2017-01-26 NOTE — Progress Notes (Signed)
Patient received discharge information and acknowledged understanding. Patient IV was removed.

## 2017-01-26 NOTE — Plan of Care (Signed)
Problem: Pain Managment: Goal: General experience of comfort will improve Outcome: Progressing Patient has not c/o pain this far into the shift.

## 2017-01-26 NOTE — Progress Notes (Signed)
Dr. Mayford Knife requests this pt have f/u within a few weeks with Dr. Royann Shivers. I have sent a message to our office's scheduler requesting a follow-up appointment, and our office will call the patient with this information.  Dayna Dunn PA-C

## 2017-01-26 NOTE — Discharge Instructions (Signed)

## 2017-01-27 ENCOUNTER — Telehealth: Payer: Self-pay | Admitting: Cardiovascular Disease

## 2017-01-27 ENCOUNTER — Other Ambulatory Visit: Payer: Self-pay | Admitting: Cardiovascular Disease

## 2017-01-27 MED ORDER — SACUBITRIL-VALSARTAN 24-26 MG PO TABS: 1.0000 | ORAL_TABLET | Freq: Two times a day (BID) | ORAL | 3 refills | Status: DC

## 2017-01-27 MED ORDER — POTASSIUM CHLORIDE CRYS ER 20 MEQ PO TBCR: 20.0000 meq | EXTENDED_RELEASE_TABLET | Freq: Every day | ORAL | 3 refills | Status: DC

## 2017-01-27 NOTE — Telephone Encounter (Signed)
°*  STAT* If patient is at the pharmacy, call can be transferred to refill team.   1. Which medications need to be refilled? (please list name of each medication and dose if known) Need this call asap today-going out of town-Potassium Chloride  2. Which pharmacy/location (including street and city if local pharmacy) is medication to be sent to?[phone]  3. Do they need a 30 day or 90 day supply?30 and refills

## 2017-01-27 NOTE — Telephone Encounter (Signed)
rx called to pharmacy 

## 2017-01-27 NOTE — Telephone Encounter (Signed)
New Message  Pt sister call requesting to speak with RN. She states she is coming to the office to pick up a coupon. She states she will also need a prescription for Entesto. She does not know the mg pt will need. She states its in the system and the provider should have it. Please call back to discuss if needed

## 2017-01-27 NOTE — Telephone Encounter (Signed)
Coupon already at front desk.  I have also left samples of Entresto at front desk - prescribed out of hospital but samples not given at discharge. He has also misplaced his printed prescription, so Rx was sent electronically to pharmacy.  Pt will send sister to pick up today.  Medication Samples have been provided to the patient.  Drug name: [name] 24-26Qty: 14 LOT: [id number].Date: 09/2018  The patient has been instructed regarding the correct time, dose, and frequency of taking this medication, including desired effects and most common side effects.

## 2017-01-28 ENCOUNTER — Other Ambulatory Visit: Payer: Self-pay

## 2017-01-28 MED ORDER — POTASSIUM CHLORIDE CRYS ER 20 MEQ PO TBCR: 20.0000 meq | EXTENDED_RELEASE_TABLET | Freq: Every day | ORAL | 3 refills | Status: DC

## 2017-01-28 NOTE — Telephone Encounter (Signed)
error 

## 2017-02-11 ENCOUNTER — Ambulatory Visit: Payer: Self-pay | Admitting: Physician Assistant
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
## 2017-02-24 ENCOUNTER — Ambulatory Visit: Payer: Self-pay | Admitting: Neurology

## 2017-02-24 ENCOUNTER — Telehealth: Payer: Self-pay

## 2017-02-24 NOTE — Telephone Encounter (Signed)
NO SHOW FOR APPT TODAY.

## 2017-02-25 ENCOUNTER — Encounter: Payer: Self-pay | Admitting: Neurology

## 2017-03-02 ENCOUNTER — Telehealth: Payer: Self-pay | Admitting: Cardiovascular Disease

## 2017-03-02 NOTE — Telephone Encounter (Signed)
new message  Patient calling the office for samples of medication:   1.  What medication and dosage are you requesting samples for? entresto 24-26mg    2.  Are you currently out of this medication? yes

## 2017-03-02 NOTE — Telephone Encounter (Signed)
Unable to reach caller. Left msg to advise samples available at front desk and to call if questions.  Medication Samples have been provided to the patient.  Drug name: [name] 24-26Qty: 28LOT: [id number].Date: 09/2018  The patient has been instructed regarding the correct time, dose, and frequency of taking this medication, including desired effects and most common side effects.

## 2017-03-27 ENCOUNTER — Telehealth: Payer: Self-pay | Admitting: Cardiovascular Disease

## 2017-03-27 MED ORDER — CARVEDILOL 3.125 MG PO TABS: 3.1250 mg | ORAL_TABLET | Freq: Two times a day (BID) | ORAL | 2 refills | Status: DC

## 2017-03-27 MED ORDER — DIGOXIN 125 MCG PO TABS: 0.1250 mg | ORAL_TABLET | Freq: Every day | ORAL | 2 refills | Status: DC

## 2017-03-27 NOTE — Telephone Encounter (Signed)
Bonita QuinLinda( sister ) is calling to speak with Harrold Donathathan about her brother's medication . The pharmacist told them that they needed to call

## 2017-03-27 NOTE — Telephone Encounter (Signed)
Pt called for refills, sent to preferred pharmacy. Also inquired about samples [facility] but informed them no samples currently available - they are aware they may check back in next week.

## 2017-03-31 ENCOUNTER — Telehealth: Payer: Self-pay | Admitting: Cardiovascular Disease

## 2017-03-31 NOTE — Telephone Encounter (Signed)
New message   Patient sister Bonita QuinLinda would like a nurse to called back regarding the # of quality that given verse 90 days supply.

## 2017-03-31 NOTE — Telephone Encounter (Signed)
Returned call to patient's sister Bonita QuinLinda no answer.LMTC.

## 2017-03-31 NOTE — Telephone Encounter (Signed)
1 box Sherryll Burgerntresto Lot # [id number] exp 9/20 left at front desk for patient Left message to call back

## 2017-03-31 NOTE — Telephone Encounter (Signed)
New message      Patient calling the office for samples of medication:   1.  What medication and dosage are you requesting samples for? Entresto 25  2.  Are you currently out of this medication? A couple days left

## 2017-04-01 ENCOUNTER — Encounter: Payer: Self-pay | Admitting: *Deleted

## 2017-04-01 NOTE — Telephone Encounter (Signed)
Returned call to sister, Bonita QuinLinda. She is inquiring about entresto 24-26mg  BID samples. 1 box provided by Juliette AlcideMelinda, LPN + additional box for a month supply. Informed her that I would include patient assistance paperwork to be completed and returned to our office for MD to sign off on and send in - patient is unemployed w/no insurance   Lot [id number] Exp: 02/2019 x2

## 2017-04-01 NOTE — Telephone Encounter (Signed)
Left message for pt to call.

## 2017-04-06 ENCOUNTER — Other Ambulatory Visit (HOSPITAL_COMMUNITY): Payer: Self-pay

## 2017-04-08 ENCOUNTER — Ambulatory Visit (HOSPITAL_COMMUNITY): Payer: Self-pay | Attending: Cardiovascular Disease

## 2017-04-08 ENCOUNTER — Other Ambulatory Visit: Payer: Self-pay

## 2017-04-08 DIAGNOSIS — I5022 Chronic systolic (congestive) heart failure: Secondary | ICD-10-CM | POA: Insufficient documentation

## 2017-04-09 NOTE — Progress Notes (Signed)
Seen by Bjorn Loserhonda for Dr. Royann Shiversroitoru. [facility], pumping function improved a little. Will [facility] and Dr. Royann Shiversroitoru to decide if still qualify for ICD eval

## 2017-04-13 ENCOUNTER — Ambulatory Visit (INDEPENDENT_AMBULATORY_CARE_PROVIDER_SITE_OTHER): Payer: Self-pay | Admitting: Cardiovascular Disease

## 2017-04-13 ENCOUNTER — Encounter: Payer: Self-pay | Admitting: Cardiovascular Disease

## 2017-04-13 VITALS — BP 112/70 | HR 82 | Ht 75.0 in | Wt 267.0 lb

## 2017-04-13 DIAGNOSIS — Z7901 Long term (current) use of anticoagulants: Secondary | ICD-10-CM

## 2017-04-13 DIAGNOSIS — I4821 Permanent atrial fibrillation: Secondary | ICD-10-CM

## 2017-04-13 DIAGNOSIS — Q231 Congenital insufficiency of aortic valve: Secondary | ICD-10-CM

## 2017-04-13 DIAGNOSIS — I7121 Aneurysm of the ascending aorta, without rupture: Secondary | ICD-10-CM

## 2017-04-13 DIAGNOSIS — I420 Dilated cardiomyopathy: Secondary | ICD-10-CM

## 2017-04-13 DIAGNOSIS — I482 Chronic atrial fibrillation: Secondary | ICD-10-CM

## 2017-04-13 DIAGNOSIS — I712 Thoracic aortic aneurysm, without rupture: Secondary | ICD-10-CM

## 2017-04-13 DIAGNOSIS — I5022 Chronic systolic (congestive) heart failure: Secondary | ICD-10-CM

## 2017-04-13 NOTE — Patient Instructions (Signed)
Dr Royann Shiversroitoru has recommended making the following medication changes: 1. TAKE Furosemide 20 mg (0.5 tablet) daily 2. INCREASE Entresto to 1 tablet twice daily  Your physician has requested that you have an echocardiogram in 3 months. Echocardiography is a painless test that uses sound waves to create images of your heart. It provides your doctor with information about the size and shape of your heart and how well your heart's chambers and valves are working. This procedure takes approximately one hour. There are no restrictions for this procedure. **[facility] [address], [address]  Dr Royann Shiversroitoru recommends that you schedule a follow-up appointment in 3 months.  If you need a refill on your cardiac medications before your next appointment, please call your pharmacy.
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
## 2017-04-15 ENCOUNTER — Encounter: Payer: Self-pay | Admitting: Cardiovascular Disease

## 2017-06-10 ENCOUNTER — Telehealth: Payer: Self-pay | Admitting: Cardiovascular Disease

## 2017-06-10 NOTE — Telephone Encounter (Signed)
Patient made aware that samples are available at the front:  Medication Samples have been provided to the patient.  Drug name: Sherryll Burgerntresto       Strength: 24-26 mg        Qty: Months worth  LOT: [id number]  Exp.Date: 6/21  And a sample box of 81 mg Aspirin.

## 2017-06-10 NOTE — Telephone Encounter (Signed)
New Message      Patient calling the office for samples of medication:   1.  What medication and dosage are you requesting samples for?  Entresto carvedilol (COREG) 3.125 MG tablet  2.  Are you currently out of this medication?  Yes , needs a month worth

## 2017-06-24 ENCOUNTER — Telehealth: Payer: Self-pay | Admitting: Cardiovascular Disease

## 2017-06-24 NOTE — Telephone Encounter (Signed)
New message      *STAT* If patient is at the pharmacy, call can be transferred to refill team.   1. Which medications need to be refilled? (please list name of each medication and dose if known) furosemide (LASIX) 40 MG tablet  2. Which pharmacy/location (including street and city if local pharmacy) is medication to be sent to? [id number] - [place], [place] - [address]  3. Do they need a 30 day or 90 day supply? 30

## 2017-06-25 MED ORDER — FUROSEMIDE 40 MG PO TABS: 20.0000 mg | ORAL_TABLET | Freq: Every day | ORAL | 3 refills | Status: DC

## 2017-06-26 ENCOUNTER — Other Ambulatory Visit: Payer: Self-pay | Admitting: *Deleted

## 2017-06-26 DIAGNOSIS — I712 Thoracic aortic aneurysm, without rupture, unspecified: Secondary | ICD-10-CM

## 2017-07-08 ENCOUNTER — Other Ambulatory Visit: Payer: Self-pay | Admitting: Cardiovascular Disease

## 2017-07-08 ENCOUNTER — Telehealth: Payer: Self-pay | Admitting: Cardiovascular Disease

## 2017-07-08 NOTE — Telephone Encounter (Signed)
LM that no samples of entresto 24-26mg  are available. Advised to call back

## 2017-07-08 NOTE — Telephone Encounter (Signed)
New Message   Patient calling the office for samples of medication:   1.  What medication and dosage are you requesting samples for? Entresto 24-26mg   2.  Are you currently out of this medication? Yes      Patient sister Bonita QuinLinda is requesting Sherryll Burgerntresto for the patient.

## 2017-07-10 ENCOUNTER — Telehealth: Payer: Self-pay | Admitting: Cardiovascular Disease

## 2017-07-10 MED ORDER — FUROSEMIDE 40 MG PO TABS: 20.0000 mg | ORAL_TABLET | Freq: Every day | ORAL | 2 refills | Status: DC

## 2017-07-10 NOTE — Telephone Encounter (Signed)
Patient calling the office for samples of medication:   1.  What medication and dosage are you requesting samples for? sacubitril-valsartan (ENTRESTO) 24-26 MG  aspirin EC 81 MG EC tablet   2.  Are you currently out of this medication? Out for 3 days of entresto

## 2017-07-10 NOTE — Telephone Encounter (Signed)
°*  STAT* If patient is at the pharmacy, call can be transferred to refill team.   1. Which medications need to be refilled? (please list name of each medication and dose if known) furosemide (LASIX) 40 MG tablet 2. Which pharmacy/location (including street and city if local pharmacy) is [facility] to[facility] [address] 3. Do they need a 30 day or 90 day supply? 30

## 2017-07-10 NOTE — Telephone Encounter (Signed)
Patient calling the office for samples of medication:   1.  What medication and dosage are you requesting samples for? Entresto 24-26 MG  2.  Are you currently out of this medication? Yes   Spoke with [facility] triage, [facility] is out of the samples. [facility] [employer] [facility].

## 2017-07-10 NOTE — Telephone Encounter (Signed)
[place] has samples, sister is aware and [facility].

## 2017-07-10 NOTE — Telephone Encounter (Signed)
Patient sister calling back for Entresto Samples  Patient calling the office for samples of medication:   1.  What medication and dosage are you requesting samples for? Entresto 24-26 MG   2.  Are you currently out of this medication? Yes   [address] does not have samples. Any way to ask [place] office for samples?

## 2017-07-10 NOTE — Telephone Encounter (Signed)
Pt's mother aware no samples available here at [address] [facility] .Zack Seal/cy

## 2017-07-13 ENCOUNTER — Ambulatory Visit: Payer: Self-pay | Attending: Internal Medicine

## 2017-07-14 ENCOUNTER — Ambulatory Visit (HOSPITAL_COMMUNITY): Payer: Self-pay | Attending: Cardiovascular Disease

## 2017-07-14 ENCOUNTER — Other Ambulatory Visit: Payer: Self-pay

## 2017-07-14 DIAGNOSIS — I080 Rheumatic disorders of both mitral and aortic valves: Secondary | ICD-10-CM | POA: Insufficient documentation

## 2017-07-14 DIAGNOSIS — I714 Abdominal aortic aneurysm, without rupture: Secondary | ICD-10-CM | POA: Insufficient documentation

## 2017-07-14 DIAGNOSIS — I5022 Chronic systolic (congestive) heart failure: Secondary | ICD-10-CM | POA: Insufficient documentation

## 2017-07-14 DIAGNOSIS — I110 Hypertensive heart disease with heart failure: Secondary | ICD-10-CM | POA: Insufficient documentation

## 2017-07-17 ENCOUNTER — Ambulatory Visit (INDEPENDENT_AMBULATORY_CARE_PROVIDER_SITE_OTHER): Payer: Self-pay | Admitting: Cardiovascular Disease

## 2017-07-17 ENCOUNTER — Encounter: Payer: Self-pay | Admitting: Cardiovascular Disease

## 2017-07-17 ENCOUNTER — Ambulatory Visit: Payer: Self-pay | Admitting: Family Medicine

## 2017-07-17 VITALS — BP 112/74 | HR 59 | Ht 75.0 in | Wt 269.0 lb

## 2017-07-17 DIAGNOSIS — I420 Dilated cardiomyopathy: Secondary | ICD-10-CM

## 2017-07-17 DIAGNOSIS — Z9189 Other specified personal risk factors, not elsewhere classified: Secondary | ICD-10-CM | POA: Insufficient documentation

## 2017-07-17 DIAGNOSIS — Q231 Congenital insufficiency of aortic valve: Secondary | ICD-10-CM | POA: Insufficient documentation

## 2017-07-17 DIAGNOSIS — I5022 Chronic systolic (congestive) heart failure: Secondary | ICD-10-CM

## 2017-07-17 DIAGNOSIS — I712 Thoracic aortic aneurysm, without rupture: Secondary | ICD-10-CM

## 2017-07-17 DIAGNOSIS — I7121 Aneurysm of the ascending aorta, without rupture: Secondary | ICD-10-CM

## 2017-07-17 DIAGNOSIS — I4819 Other persistent atrial fibrillation: Secondary | ICD-10-CM

## 2017-07-17 DIAGNOSIS — I481 Persistent atrial fibrillation: Secondary | ICD-10-CM

## 2017-07-17 DIAGNOSIS — Z7901 Long term (current) use of anticoagulants: Secondary | ICD-10-CM | POA: Insufficient documentation

## 2017-07-17 MED ORDER — SACUBITRIL-VALSARTAN 49-51 MG PO TABS: 1.0000 | ORAL_TABLET | Freq: Two times a day (BID) | ORAL | 1 refills | Status: DC

## 2017-07-17 NOTE — Patient Instructions (Signed)
Dr Royann Shiversroitoru has recommended making the following medication changes: 1. INCREASE Entresto to 49/51 mg  Your physician recommends that you schedule a follow-up appointment in 3 months.  If you need a refill on your cardiac medications before your next appointment, please call your pharmacy.
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
## 2017-07-21 ENCOUNTER — Ambulatory Visit: Payer: Self-pay | Admitting: Family Medicine

## 2017-07-22 ENCOUNTER — Other Ambulatory Visit: Payer: Self-pay

## 2017-07-22 ENCOUNTER — Ambulatory Visit: Payer: Self-pay | Admitting: Surgery

## 2017-08-14 ENCOUNTER — Ambulatory Visit: Payer: Self-pay | Admitting: Cardiovascular Disease

## 2017-08-24 ENCOUNTER — Telehealth: Payer: Self-pay | Admitting: Cardiovascular Disease

## 2017-08-24 NOTE — Telephone Encounter (Signed)
Medication samples have been provided to the patient.  Drug name: Sherryll Burgerntresto 49/61 mg  Qty: 1 box  LOT: [id number]  Exp.Date: 01/21  Samples left at front desk for patient pick-up. Patient notified.  Also advised to call [phone] or [employer] to inquire about assistance.   Per previous note-this has been printed and provided to patient previously.

## 2017-08-24 NOTE — Telephone Encounter (Signed)
New Message     Patient calling the office for samples of medication:   1.  What medication and dosage are you requesting samples for? sacubitril-valsartan (ENTRESTO) 49-51 MG Take 1 tablet by mouth 2 (two) times daily.   aspirin EC 81 MG EC tablet Take 1 tablet (81 mg total) by mouth daily.      2.  Are you currently out of this medication?  yes

## 2017-08-26 ENCOUNTER — Inpatient Hospital Stay: Admission: RE | Admit: 2017-08-26 | Payer: Self-pay | Source: Ambulatory Visit

## 2017-08-26 ENCOUNTER — Telehealth: Payer: Self-pay | Admitting: Cardiovascular Disease

## 2017-08-26 ENCOUNTER — Ambulatory Visit: Payer: Self-pay | Admitting: Surgery

## 2017-08-26 NOTE — Telephone Encounter (Signed)
New  Message ° °Patient calling the office for samples of medication: ° ° °1.  What medication and dosage are you requesting samples for?  sacubitril-valsartan (ENTRESTO) 49-51 MG ° °2.  Are you currently out of this medication? yes ° ° ° ° ° °

## 2017-08-26 NOTE — Telephone Encounter (Signed)
Returned call to patient's sister. She picked up samples of entresto for her brother who is in [place], she overnights his medications to him. Patient is not working, he is disabled, he has no insurance. She states she has tried to call Sherryll Burgerntresto and got brochures but has never submitted a patient assistance application. Advised this would be printed for them and it is imperative that this be completed and returned so it can be submitted.   Provided 1 additional box of samples: Lot: [id number] Exp: jan 2021  Harvel RicksSharpe, Hayley A, RN    08/24/17 4:09 PM  Note    Medication samples have been provided to the patient.  Drug name: Sherryll Burgerntresto 49/61 mg                     Qty: 1 box                   LOT: [id number]                      Exp.Date: 01/21  Samples left at front desk for patient pick-up. Patient notified.  Also advised to call [phone]-Entresto or [employer] to inquire about assistance.   Per previous note-this has been printed and provided to patient previously.

## 2017-09-17 ENCOUNTER — Other Ambulatory Visit: Payer: Self-pay | Admitting: Cardiovascular Disease

## 2017-09-17 NOTE — Telephone Encounter (Signed)
REFILL 

## 2017-09-18 ENCOUNTER — Telehealth: Payer: Self-pay | Admitting: Cardiovascular Disease

## 2017-09-18 NOTE — Telephone Encounter (Signed)
New message  Patient's sister Bonita QuinLinda calling to request samples Patient calling the office for samples of medication:   1.  What medication and dosage are you requesting samples for?sacubitril-valsartan (ENTRESTO) 49-51 MG  2.  Are you currently out of this medication? no

## 2017-09-18 NOTE — Telephone Encounter (Signed)
Medication samples have been provided to the patient.  Drug name: Sherryll Burgerntresto 56/3849/51  Qty: 1 box   LOT: [id number]  Exp.Date: 01/21  Samples left at front desk for patient pick-up. Patient notified.     Per sister, assistance forms were dropped off last week and we are awaiting a decision.

## 2017-10-01 ENCOUNTER — Telehealth: Payer: Self-pay | Admitting: Cardiovascular Disease

## 2017-10-01 NOTE — Telephone Encounter (Signed)
Pt's mom Caleb [name] calling:   wanting to know status of application for Entresto. Please call her at [phone]

## 2017-10-02 NOTE — Telephone Encounter (Signed)
lmtcb

## 2017-10-02 NOTE — Telephone Encounter (Signed)
Pt's dtr Bonita QuinLinda calling back to check on the status of the application of Entresto-[phone]--pls advise

## 2017-10-05 NOTE — Telephone Encounter (Signed)
Patient assistance application has been approved. Lmtcb.

## 2017-10-07 ENCOUNTER — Telehealth: Payer: Self-pay | Admitting: Cardiovascular Disease

## 2017-10-07 NOTE — Telephone Encounter (Signed)
Pt's sister is calling;   Pt need new prescription for Lasix for the 1 tablet and not for the 1/2 tablet. Pt is out    *STAT* If patient is at the pharmacy, call can be transferred to refill team.   1. Which medications need to be refilled? (please list name of each medication and dose if known) Lasix don't know mg  2. Which pharmacy/location (including street and city if local pharmacy) is medication to be sent to?[facility]  3. Do they need a 30 day or 90 day supply? 30 days 1 tablet a day w/refills

## 2017-10-08 MED ORDER — FUROSEMIDE 40 MG PO TABS: 20.0000 mg | ORAL_TABLET | Freq: Every day | ORAL | 0 refills | Status: DC

## 2017-10-13 ENCOUNTER — Telehealth: Payer: Self-pay | Admitting: *Deleted

## 2017-10-20 ENCOUNTER — Encounter: Payer: Self-pay | Admitting: Cardiovascular Disease

## 2017-10-20 ENCOUNTER — Ambulatory Visit: Payer: Self-pay | Admitting: Cardiovascular Disease

## 2017-11-15 ENCOUNTER — Other Ambulatory Visit: Payer: Self-pay | Admitting: Cardiovascular Disease

## 2017-12-07 ENCOUNTER — Telehealth: Payer: Self-pay | Admitting: Cardiovascular Disease

## 2017-12-07 NOTE — Telephone Encounter (Signed)
New Message ° ° °Patient calling the office for samples of medication: ° ° °1.  What medication and dosage are you requesting samples for?apixaban (ELIQUIS) 5 MG TABS tablet  ° °2.  Are you currently out of this medication? Yes  ° ° ° °

## 2017-12-09 ENCOUNTER — Ambulatory Visit
Admission: RE | Admit: 2017-12-09 | Discharge: 2017-12-09 | Disposition: A | Discharge status: Home / Self Care | Payer: No Typology Code available for payment source | Source: Ambulatory Visit | Attending: Surgery | Admitting: Surgery

## 2017-12-09 ENCOUNTER — Ambulatory Visit (INDEPENDENT_AMBULATORY_CARE_PROVIDER_SITE_OTHER): Payer: No Typology Code available for payment source | Admitting: Surgery

## 2017-12-09 ENCOUNTER — Other Ambulatory Visit: Payer: Self-pay

## 2017-12-09 ENCOUNTER — Encounter: Payer: Self-pay | Admitting: Surgery

## 2017-12-09 VITALS — BP 110/88 | HR 51 | Resp 18 | Ht 75.0 in | Wt 260.0 lb

## 2017-12-09 DIAGNOSIS — I712 Thoracic aortic aneurysm, without rupture, unspecified: Secondary | ICD-10-CM

## 2017-12-09 MED ORDER — IOPAMIDOL (ISOVUE-370) INJECTION 76%
75.0000 mL | Freq: Once | INTRAVENOUS | Status: AC | PRN
  Administered 2017-12-09: 75 mL via INTRAVENOUS

## 2017-12-09 NOTE — Progress Notes (Signed)
I tried to reach him this afternoon, but he did not answer his phone.  I will try again tomorrow.  We plan to schedule him for heart catheterization no later than next week.  Thank you, Judie GrieveBryan. MCr

## 2017-12-09 NOTE — Telephone Encounter (Signed)
Eliquis 5 mg #4 lot [id number] exp 6/21 at front for pick up, sister aware

## 2017-12-09 NOTE — Progress Notes (Signed)
   HPI:  The patient is a 56 year old gentleman with a history of atrial fibrillation on Eliquis, prior strokes one of which occurred last year while on Eliquis, hypertension, known bicuspid aortic valve with an ascending aortic aneurysm, and chronic systolic and diastolic heart failure. I saw him in 2012 for initial consultation for a 5.1 cm fusiform ascending aortic aneurysm and recommended followup in 6 months but he never returned. He has lived in [place] and has been returning to [place] where his family lives intermittently. He was diagnosed by Dr. Croitoru with idiopathic dilated cardiomyopathy in July 2018 with an echo showing an LVEF of 20%. His only prior echo was in 2002 when his EF was 40-45%. He was taken off diltiazem and started on Coreg, digoxin and Losartan. He presented at that time with an acute stroke with dysarthria and right arm weakness and MRI showed three tiny acute cortical infarcts in the bilateral frontal and left parietal lobes consistent with bilateral ACA and left MCA territories. There was not significant stenosis or occlusion of the intracranial vessels. It was felt that this was most likely embolic from his atrial fib. Echo at that time showed an EF of 20% with a moderately dilated LV, moderate LVH, possible apical thrombus. The aortic valve is bicuspid with no stenosis and moderate AI. The leaflets were moderately thickened and calcified. CTA of the chest showed some enlargement of the ascending aortic aneurysm to 5.3 x 5.5 cm. This aneurysm extends from the aortic root up to the proximal arch with bovine origin of the innominate and left carotid arteries.  I saw him last on 01/26/2017 at that time he just been started on medical therapy for his cardiomyopathy and it had a recent embolic stroke and we felt it would be best to let him recover for a while before considering surgical treatment of his aortic aneurysm.  He says that he has been feeling well and walking and  going to the gym without difficulty.  He denies any chest pain or pressure.  He has had some shortness of breath with climbing stairs and doing any heavy activity but his normal daily activity does not cause any symptoms.  He denies any orthopnea or PND.  He has had no peripheral edema.  He has returned home to [place] for as long as necessary to get his aneurysm repaired.  He saw Dr. Croitoru in February and had a repeat echocardiogram showing persistent left ventricular systolic dysfunction with ejection fraction of 25 to 30%.  The aortic valve was functionally bicuspid with moderate central regurgitation.  The mean gradient was 8 mmHg.  There was mild to moderate mitral regurgitation.   Current Outpatient Medications  Medication Sig Dispense Refill  . apixaban (ELIQUIS) 5 MG TABS tablet Take 5 mg by mouth 2 (two) times daily.    . aspirin EC 81 MG EC tablet Take 1 tablet (81 mg total) by mouth daily. 30 tablet 1  . atorvastatin (LIPITOR) 40 MG tablet Take 1 tablet (40 mg total) by mouth daily. 30 tablet 1  . carvedilol (COREG) 3.125 MG tablet TAKE 1 TABLET BY MOUTH TWICE A DAY WITH A MEAL 60 tablet 2  . Cholecalciferol (VITAMIN D PO) Take 1 tablet by mouth daily.    . digoxin (LANOXIN) 0.125 MG tablet TAKE 1 TABLET BY MOUTH EVERY DAY 30 tablet 6  . furosemide (LASIX) 40 MG tablet TAKE HALF A TABLET EVERY DAY 15 tablet 3  . Magnesium 500 MG   TABS Take 1,000 mg by mouth daily.    . potassium chloride SA (K-DUR,KLOR-CON) 20 MEQ tablet TAKE 1 TABLET BY MOUTH EVERY DAY 30 tablet 6  . sacubitril-valsartan (ENTRESTO) 49-51 MG Take 1 tablet by mouth 2 (two) times daily. 180 tablet 1  . zinc sulfate (ZINC-220) 220 (50 Zn) MG capsule Take 440 mg by mouth daily.     No current facility-administered medications for this visit.      Physical Exam: BP 110/88 (BP Location: Left Arm, Patient Position: Sitting, Cuff Size: Large)   Pulse (!) 51   Resp 18   Ht 6' 3" (1.905 m)   Wt 260 lb (117.9 kg)    SpO2 98% Comment: RA  BMI 32.50 kg/m  He looks well.   Cardiac exam shows an irregular rate and rhythm with a 2 out of 6 systolic murmur along the right sternal border and a 2 out of 6 diastolic murmur along the left lower sternal border. Lungs are clear. There is no peripheral edema.  Diagnostic Tests:    * Site [facility]*                        [address]                        [place], [place] [address]                            [phone]  ------------------------------------------------------------------- Echocardiography  Patient:    [name], [name] MR #:       [id number] Study Date: 07/14/2017 Gender:     M Age:        56 Height:     190.5 cm Weight:     121.1 kg BSA:        2.56 m^2 Pt. Status: Room:   ATTENDING    Caleb Croitoru, MD  ORDERING     Caleb Croitoru, MD  REFERRING    Caleb Croitoru, MD  PERFORMING   [facility], Outpatient  SONOGRAPHER  Bethany McMahill, RDCS  cc:  ------------------------------------------------------------------- LV EF: 25% -   30%  ------------------------------------------------------------------- Indications:      CHF (I50.22).  ------------------------------------------------------------------- History:   PMH:  Thoracic and Abdominal Aortic Aneurysm. Congestive heart failure.  Stroke.  Risk factors:  Family history of coronary artery disease. Hypertension.  ------------------------------------------------------------------- Study Conclusions  - Left ventricle: The cavity size was moderately dilated. Wall   thickness was increased in a pattern of mild LVH. Left   ventricular geometry showed evidence of eccentric hypertrophy.   Systolic function was severely reduced. The estimated ejection   fraction was in the range of 25% to 30%. Severe diffuse   hypokinesis with no identifiable regional variations. No evidence   of thrombus. - Aortic valve: Functionally bicuspid. There was moderate   regurgitation  directed centrally in the LVOT. Valve area (VTI):   2.08 cm^2. Valve area (Vmax): 2.01 cm^2. - Mitral valve: There was mild to moderate regurgitation directed   eccentrically and posteriorly. The acceleration rate of the   regurgitant jet was reduced, consistent with a low dP/dt. - Left atrium: The atrium was moderately to severely dilated. - Right ventricle: The cavity size was moderately dilated. - Right atrium: The atrium was moderately to severely dilated. - Pulmonary arteries: Systolic pressure was mildly increased. PA   peak pressure: 43 mm Hg (S).  ------------------------------------------------------------------- Study   data:  Comparison was made to the study of 04/08/2017.  Study status:  Routine.  Procedure:  Transthoracic echocardiography. Image quality was adequate.          Echocardiography.  M-mode, complete 2D, spectral Doppler, and color Doppler.  Birthdate: Patient birthdate: 06/20/1960.  Age:  Patient is 56 yr old.  Sex: Gender: male.    BMI: 33.4 kg/m^2.  Blood pressure:     112/70 Patient status:  Outpatient.  Study date:  Study date: 07/14/2017. Study time: 09:43 AM.  Location:  [facility]  -------------------------------------------------------------------  ------------------------------------------------------------------- Left ventricle:  The cavity size was moderately dilated. Wall thickness was increased in a pattern of mild LVH.  Left ventricular geometry showed evidence of eccentric hypertrophy. Systolic function was severely reduced. The estimated ejection fraction was in the range of 25% to 30%.  Severe diffuse hypokinesis with no identifiable regional variations.  No evidence of thrombus. The study was not technically sufficient to allow evaluation of LV diastolic dysfunction due to atrial fibrillation.  ------------------------------------------------------------------- Aortic valve:   Functionally bicuspid.  Doppler:   Transvalvular velocity was increased less than expected, due to low cardiac output. There was no stenosis. There was moderate regurgitation directed centrally in the LVOT.    VTI ratio of LVOT to aortic valve: 0.33. Valve area (VTI): 2.08 cm^2. Indexed valve area (VTI): 0.81 cm^2/m^2. Peak velocity ratio of LVOT to aortic valve: 0.32. Valve area (Vmax): 2.01 cm^2. Indexed valve area (Vmax): 0.78 cm^2/m^2.    Mean gradient (S): 8 mm Hg. Peak gradient (S): 14 mm Hg.  ------------------------------------------------------------------- Aorta:  Ascending aorta: The ascending aorta was moderately dilated.  ------------------------------------------------------------------- Mitral valve:   Doppler:  There was mild to moderate regurgitation directed eccentrically and posteriorly. The acceleration rate of the regurgitant jet was reduced, consistent with a low dP/dt.  ------------------------------------------------------------------- Left atrium:  The atrium was moderately to severely dilated.   ------------------------------------------------------------------- Right ventricle:  The cavity size was moderately dilated.  ------------------------------------------------------------------- Pulmonic valve:   Poorly visualized.  The valve appears to be grossly normal.    Doppler:  Transvalvular velocity was within the normal range. There was no evidence for stenosis. There was no significant regurgitation.  ------------------------------------------------------------------- Tricuspid valve:   Structurally normal valve.   Leaflet separation was normal.  Doppler:  Transvalvular velocity was within the normal range. There was trivial regurgitation.  ------------------------------------------------------------------- Pulmonary artery:   Systolic pressure was mildly increased.  ------------------------------------------------------------------- Right atrium:  The atrium was moderately to  severely dilated.  ------------------------------------------------------------------- Pericardium:  There was no pericardial effusion.  ------------------------------------------------------------------- Systemic veins: Inferior vena cava: The vessel was dilated. The respirophasic diameter changes were blunted (< 50%), consistent with elevated central venous pressure.  ------------------------------------------------------------------- Measurements   Left ventricle                           Value           Reference  LV ID, ED, PLAX maximal          (H)     62.16  mm       36 - 54  LV ID, ES, PLAX maximal          (H)     53.47  mm       23 - 40  LV fx shortening, PLAX maximal   (L)     14     %        >=29    LV PW thickness, ED                      14.99  mm       ---------  IVS/LV PW ratio, ED                      0.85            <=1.3  LV ejection time                         240    ms       ---------    Ventricular septum                       Value           Reference  IVS thickness, ED                        12.71  mm       ---------    LVOT                                     Value           Reference  LVOT ID, S                               28.3   mm       ---------  LVOT area                                6.29   cm^2     ---------  LVOT peak velocity, S                    60.03  cm/s     ---------  LVOT VTI, S                              10.01  cm       ---------  Stroke volume (SV), LVOT DP              63     ml       ---------  Stroke index (SV/bsa), LVOT DP           24.5   ml/m^2   ---------    Aortic valve                             Value           Reference  Aortic valve peak velocity, S            188.97 cm/s     ---------  Aortic valve mean velocity, S            131.88 cm/s     ---------  Aortic valve VTI, S                      30.65  cm       ---------  Aortic mean gradient, S                    8      mm Hg    ---------  Aortic peak gradient, S                   14     mm Hg    ---------  VTI ratio, LVOT/AV                       0.33            ---------  Aortic valve area, VTI                   2.08   cm^2     ---------  Aortic valve area/bsa, VTI               0.81   cm^2/m^2 ---------  Velocity ratio, peak, LVOT/AV            0.32            ---------  Aortic valve area, peak velocity         2.01   cm^2     ---------  Aortic valve area/bsa, peak              0.78   cm^2/m^2 ---------  velocity  Aortic regurg peak velocity              511.45 cm/s     ---------  Aortic regurg deceleration               406    cm/s^2   ---------  Aortic regurg deceleration time          1261   ms       ---------  Aortic regurg pressure half-time         366    ms       ---------  Aortic regurg peak gradient              105    mm Hg    ---------    Aorta                                    Value           Reference  Aortic root ID, ED                       44.31  mm       ---------  Ascending aorta ID, A-P, mid, ED (H)     48.62  mm       21 - 34    Left atrium                              Value           Reference  LA ID, A-P, ES                           48.99  mm       ---------  LA ID/bsa, A-P                           1.91   cm/m^2   <=2.2    Pulmonary arteries                         Value           Reference  PA pressure, S, DP               (H)     43     mm Hg    <=30    Tricuspid valve                          Value           Reference  Tricuspid regurg peak velocity           294    cm/s     ---------  Tricuspid peak RV-RA gradient            35     mm Hg    ---------  Tricuspid maximal regurg                 286.81 cm/s     ---------  velocity, PISA    Systemic veins                           Value           Reference  Estimated CVP                            8      mm Hg    ---------    Right ventricle                          Value           Reference  RV pressure, S, DP               (H)     43     mm Hg    <=30  Legend: (L)   and  (H)  mark values outside specified reference range.  ------------------------------------------------------------------- Prepared and Electronically Authenticated by  Caleb Croitoru, MD 2019-02-05T12:37:58   CLINICAL DATA:  Thoracic aortic aneurysm without rupture.  EXAM: CT ANGIOGRAPHY CHEST WITH CONTRAST  TECHNIQUE: Multidetector CT imaging of the chest was performed using the standard protocol during bolus administration of intravenous contrast. Multiplanar CT image reconstructions and MIPs were obtained to evaluate the vascular anatomy.  CONTRAST:  75mL ISOVUE-370 IOPAMIDOL (ISOVUE-370) INJECTION 76%  COMPARISON:  CT scan of January 24, 2017.  FINDINGS: Cardiovascular: Grossly stable 5.4 cm ascending thoracic aortic aneurysm is noted. No dissection is noted. Great vessels are widely patent without significant stenosis. Transverse aortic arch measures 2.9 cm. Proximal descending thoracic aorta measures 3.0 cm. Normal cardiac size. No pericardial effusion.  Mediastinum/Nodes: No enlarged mediastinal, hilar, or axillary lymph nodes. Thyroid gland, trachea, and esophagus demonstrate no significant findings.  Lungs/Pleura: Lungs are clear. No pleural effusion or pneumothorax.  Upper Abdomen: No acute abnormality.  Musculoskeletal: No chest wall abnormality. No acute or significant osseous findings.  Review of the MIP images confirms the above findings.  IMPRESSION: Stable 5.4 cm ascending thoracic aortic aneurysm. Recommend semi-annual imaging followup by CTA or MRA and referral to cardiothoracic surgery if not already obtained. This recommendation follows 2010 ACCF/AHA/AATS/ACR/ASA/SCA/SCAI/SIR/STS/SVM Guidelines for the Diagnosis and Management of Patients With Thoracic Aortic Disease. Circulation. 2010; 121: e266-e369.   Electronically Signed   By: Caleb  [name] [name], M.D.   On: 12/09/2017 12:18   Impression:  This gentleman has   a bicuspid  aortic valve with moderate aortic insufficiency and a 5.4 cm fusiform ascending aortic aneurysm.  He also has severe left ventricular systolic dysfunction with ejection fraction of 25 to 30% with diffuse hypokinesis with left ventricular dilation.  Mean gradient across the aortic valve was only 8 mmHg which is less that I would expect given the appearance of his valve with restricted leaflet mobility.  I suspect that his aortic stenosis is probably more significant then reflected in the mean gradient given his low ejection fraction.  I think his aortic insufficiency is at least moderate if not severe visually and the pressure half-time is 366 ms.  With a bicuspid aortic valve his ascending aortic aneurysm is already above the surgical threshold.  I think the best option for this patient would be replacement of his aortic valve and ascending aortic aneurysm which will decrease his risk of aortic dissection and hopefully prevent further left ventricular deterioration due to the aortic stenosis and regurgitation.  His operative risk would certainly be increased due to his severe left ventricular dysfunction I do not think there is any other good option for him.  He will require cardiac catheterization prior to surgical treatment to evaluate his coronary arteries.  This will rule out ischemia as a cause of his left ventricular dysfunction.  I reviewed the CTA images and echocardiogram images with him and answered his questions.  He would like to proceed with further work-up and surgical treatment.  Plan:  We will ask Dr. Croitoru to schedule a cardiac catheterization and then I will see the patient back to review those findings with him and make final surgical plans.  I spent 15 minutes performing this established patient evaluation and > 50% of this time was spent face to face counseling and coordinating the care of this patient's bicuspid aortic valve and aortic aneurysm.    Bryan K Bartle, MD [facility] [phone]      

## 2017-12-09 NOTE — H&P (View-Only) (Signed)
HPI:  The patient is a 57 year old gentleman with a history of atrial fibrillation on Eliquis, prior strokes one of which occurred last year while on Eliquis, hypertension, known bicuspid aortic valve with an ascending aortic aneurysm, and chronic systolic and diastolic heart failure. I saw him in 2012 for initial consultation for a 5.1 cm fusiform ascending aortic aneurysm and recommended followup in 6 months but he never returned. He has lived in [place] and has been returning to [place] where his family lives intermittently. He was diagnosed by Dr. Royann Shivers with idiopathic dilated cardiomyopathy in July 2018 with an echo showing an LVEF of 20%. His only prior echo was in 2002 when his EF was 40-45%. He was taken off diltiazem and started on Coreg, digoxin and Losartan. He presented at that time with an acute stroke with dysarthria and right arm weakness and MRI showed three tiny acute cortical infarcts in the bilateral frontal and left parietal lobes consistent with bilateral ACA and left MCA territories. There was not significant stenosis or occlusion of the intracranial vessels. It was felt that this was most likely embolic from his atrial fib. Echo at that time showed an EF of 20% with a moderately dilated LV, moderate LVH, possible apical thrombus. The aortic valve is bicuspid with no stenosis and moderate AI. The leaflets were moderately thickened and calcified. CTA of the chest showed some enlargement of the ascending aortic aneurysm to 5.3 x 5.5 cm. This aneurysm extends from the aortic root up to the proximal arch with bovine origin of the innominate and left carotid arteries.  I saw him last on 01/26/2017 at that time he just been started on medical therapy for his cardiomyopathy and it had a recent embolic stroke and we felt it would be best to let him recover for a while before considering surgical treatment of his aortic aneurysm.  He says that he has been feeling well and walking and  going to the gym without difficulty.  He denies any chest pain or pressure.  He has had some shortness of breath with climbing stairs and doing any heavy activity but his normal daily activity does not cause any symptoms.  He denies any orthopnea or PND.  He has had no peripheral edema.  He has returned home to [facility] for as long as necessary to get his aneurysm repaired.  He saw Dr. Royann Shivers in February and had a repeat echocardiogram showing persistent left ventricular systolic dysfunction with ejection fraction of 25 to 30%.  The aortic valve was functionally bicuspid with moderate central regurgitation.  The mean gradient was 8 mmHg.  There was mild to moderate mitral regurgitation.   Current Outpatient Medications  Medication Sig Dispense Refill  . apixaban (ELIQUIS) 5 MG TABS tablet Take 5 mg by mouth 2 (two) times daily.    Marland Kitchen aspirin EC 81 MG EC tablet Take 1 tablet (81 mg total) by mouth daily. 30 tablet 1  . atorvastatin (LIPITOR) 40 MG tablet Take 1 tablet (40 mg total) by mouth daily. 30 tablet 1  . carvedilol (COREG) 3.125 MG tablet TAKE 1 TABLET BY MOUTH TWICE A DAY WITH A MEAL 60 tablet 2  . Cholecalciferol (VITAMIN D PO) Take 1 tablet by mouth daily.    . digoxin (LANOXIN) 0.125 MG tablet TAKE 1 TABLET BY MOUTH EVERY DAY 30 tablet 6  . furosemide (LASIX) 40 MG tablet TAKE HALF A TABLET EVERY DAY 15 tablet 3  . Magnesium 500 MG  TABS Take 1,000 mg by mouth daily.    . potassium chloride SA (K-DUR,KLOR-CON) 20 MEQ tablet TAKE 1 TABLET BY MOUTH EVERY DAY 30 tablet 6  . sacubitril-valsartan (ENTRESTO) 49-51 MG Take 1 tablet by mouth 2 (two) times daily. 180 tablet 1  . zinc sulfate (ZINC-220) 220 (50 Zn) MG capsule Take 440 mg by mouth daily.     No current facility-administered medications for this visit.      Physical Exam: BP 110/88 (BP Location: Left Arm, Patient Position: Sitting, Cuff Size: Large)   Pulse (!) 51   Resp 18   Ht 6\' 3"  (1.905 m)   Wt 260 lb (117.9 kg)    SpO2 98% Comment: RA  BMI 32.50 kg/m  He looks well.   Cardiac exam shows an irregular rate and rhythm with a 2 out of 6 systolic murmur along the right sternal border and a 2 out of 6 diastolic murmur along the left lower sternal border. Lungs are clear. There is no peripheral edema.  Diagnostic Tests:    [facility]                        [address]                        [place], [place] [address]                            [phone]  ------------------------------------------------------------------- Echocardiography  Patient:    Caleb [name], Caleb [name] MR #:       [id number] Study Date: 07/14/2017 Gender:     M Age:        89 Height:     190.5 cm Weight:     121.1 kg BSA:        2.56 m^2 Pt. Status: Room:   ATTENDING    Thurmon Fair, MD  ORDERING     Thurmon Fair, MD  REFERRING    Thurmon Fair, MD  PERFORMING   [facility], Outpatient  SONOGRAPHER  [facility], RDCS  cc:  ------------------------------------------------------------------- LV EF: 25% -   30%  ------------------------------------------------------------------- Indications:      CHF (I50.22).  ------------------------------------------------------------------- History:   PMH:  Thoracic and Abdominal Aortic Aneurysm. Congestive heart failure.  Stroke.  Risk factors:  Family history of coronary artery disease. Hypertension.  ------------------------------------------------------------------- Study Conclusions  - Left ventricle: The cavity size was moderately dilated. Wall   thickness was increased in a pattern of mild LVH. Left   ventricular geometry showed evidence of eccentric hypertrophy.   Systolic function was severely reduced. The estimated ejection   fraction was in the range of 25% to 30%. Severe diffuse   hypokinesis with no identifiable regional variations. No evidence   of thrombus. - Aortic valve: Functionally bicuspid. There was moderate   regurgitation  directed centrally in the LVOT. Valve area (VTI):   2.08 cm^2. Valve area (Vmax): 2.01 cm^2. - Mitral valve: There was mild to moderate regurgitation directed   eccentrically and posteriorly. The acceleration rate of the   regurgitant jet was reduced, consistent with a low dP/dt. - Left atrium: The atrium was moderately to severely dilated. - Right ventricle: The cavity size was moderately dilated. - Right atrium: The atrium was moderately to severely dilated. - Pulmonary arteries: Systolic pressure was mildly increased. PA   peak pressure: 43 mm Hg (S).  ------------------------------------------------------------------- Study  data:  Comparison was made to the study of 04/08/2017.  Study status:  Routine.  Procedure:  Transthoracic echocardiography. Image quality was adequate.          Echocardiography.  M-mode, complete 2D, spectral Doppler, and color Doppler.  Birthdate: Patient birthdate: Jun 26, 1960.  Age:  Patient is 57 yr old.  Sex: Gender: male.    BMI: 33.4 kg/m^2.  Blood pressure:     112/70 Patient status:  Outpatient.  Study date:  Study date: 07/14/2017. Study time: 09:43 AM.  Location:  [facility]  -------------------------------------------------------------------  ------------------------------------------------------------------- Left ventricle:  The cavity size was moderately dilated. Wall thickness was increased in a pattern of mild LVH.  Left ventricular geometry showed evidence of eccentric hypertrophy. Systolic function was severely reduced. The estimated ejection fraction was in the range of 25% to 30%.  Severe diffuse hypokinesis with no identifiable regional variations.  No evidence of thrombus. The study was not technically sufficient to allow evaluation of LV diastolic dysfunction due to atrial fibrillation.  ------------------------------------------------------------------- Aortic valve:   Functionally bicuspid.  Doppler:   Transvalvular velocity was increased less than expected, due to low cardiac output. There was no stenosis. There was moderate regurgitation directed centrally in the LVOT.    VTI ratio of LVOT to aortic valve: 0.33. Valve area (VTI): 2.08 cm^2. Indexed valve area (VTI): 0.81 cm^2/m^2. Peak velocity ratio of LVOT to aortic valve: 0.32. Valve area (Vmax): 2.01 cm^2. Indexed valve area (Vmax): 0.78 cm^2/m^2.    Mean gradient (S): 8 mm Hg. Peak gradient (S): 14 mm Hg.  ------------------------------------------------------------------- Aorta:  Ascending aorta: The ascending aorta was moderately dilated.  ------------------------------------------------------------------- Mitral valve:   Doppler:  There was mild to moderate regurgitation directed eccentrically and posteriorly. The acceleration rate of the regurgitant jet was reduced, consistent with a low dP/dt.  ------------------------------------------------------------------- Left atrium:  The atrium was moderately to severely dilated.   ------------------------------------------------------------------- Right ventricle:  The cavity size was moderately dilated.  ------------------------------------------------------------------- Pulmonic valve:   Poorly visualized.  The valve appears to be grossly normal.    Doppler:  Transvalvular velocity was within the normal range. There was no evidence for stenosis. There was no significant regurgitation.  ------------------------------------------------------------------- Tricuspid valve:   Structurally normal valve.   Leaflet separation was normal.  Doppler:  Transvalvular velocity was within the normal range. There was trivial regurgitation.  ------------------------------------------------------------------- Pulmonary artery:   Systolic pressure was mildly increased.  ------------------------------------------------------------------- Right atrium:  The atrium was moderately to  severely dilated.  ------------------------------------------------------------------- Pericardium:  There was no pericardial effusion.  ------------------------------------------------------------------- Systemic veins: Inferior vena cava: The vessel was dilated. The respirophasic diameter changes were blunted (< 50%), consistent with elevated central venous pressure.  ------------------------------------------------------------------- Measurements   Left ventricle                           Value           Reference  LV ID, ED, PLAX maximal          (H)     62.16  mm       36 - 54  LV ID, ES, PLAX maximal          (H)     53.47  mm       23 - 40  LV fx shortening, PLAX maximal   (L)     14     %        >=29  LV PW thickness, ED                      14.99  mm       ---------  IVS/LV PW ratio, ED                      0.85            <=1.3  LV ejection time                         240    ms       ---------    Ventricular septum                       Value           Reference  IVS thickness, ED                        12.71  mm       ---------    LVOT                                     Value           Reference  LVOT ID, S                               28.3   mm       ---------  LVOT area                                6.29   cm^2     ---------  LVOT peak velocity, S                    60.03  cm/s     ---------  LVOT VTI, S                              10.01  cm       ---------  Stroke volume (SV), LVOT DP              63     ml       ---------  Stroke index (SV/bsa), LVOT DP           24.5   ml/m^2   ---------    Aortic valve                             Value           Reference  Aortic valve peak velocity, S            188.97 cm/s     ---------  Aortic valve mean velocity, S            131.88 cm/s     ---------  Aortic valve VTI, S                      30.65  cm       ---------  Aortic mean gradient, S  8      mm Hg    ---------  Aortic peak gradient, S                   14     mm Hg    ---------  VTI ratio, LVOT/AV                       0.33            ---------  Aortic valve area, VTI                   2.08   cm^2     ---------  Aortic valve area/bsa, VTI               0.81   cm^2/m^2 ---------  Velocity ratio, peak, LVOT/AV            0.32            ---------  Aortic valve area, peak velocity         2.01   cm^2     ---------  Aortic valve area/bsa, peak              0.78   cm^2/m^2 ---------  velocity  Aortic regurg peak velocity              511.45 cm/s     ---------  Aortic regurg deceleration               406    cm/s^2   ---------  Aortic regurg deceleration time          1261   ms       ---------  Aortic regurg pressure half-time         366    ms       ---------  Aortic regurg peak gradient              105    mm Hg    ---------    Aorta                                    Value           Reference  Aortic root ID, ED                       44.31  mm       ---------  Ascending aorta ID, A-P, mid, ED (H)     48.62  mm       21 - 34    Left atrium                              Value           Reference  LA ID, A-P, ES                           48.99  mm       ---------  LA ID/bsa, A-P                           1.91   cm/m^2   <=2.2    Pulmonary arteries  Value           Reference  PA pressure, S, DP               (H)     43     mm Hg    <=30    Tricuspid valve                          Value           Reference  Tricuspid regurg peak velocity           294    cm/s     ---------  Tricuspid peak RV-RA gradient            35     mm Hg    ---------  Tricuspid maximal regurg                 286.81 cm/s     ---------  velocity, PISA    Systemic veins                           Value           Reference  Estimated CVP                            8      mm Hg    ---------    Right ventricle                          Value           Reference  RV pressure, S, DP               (H)     43     mm Hg    <=30  Legend: (L)   and  (H)  mark values outside specified reference range.  ------------------------------------------------------------------- Prepared and Electronically Authenticated by  Thurmon FairMihai Croitoru, MD 2019-02-05T12:37:58   CLINICAL DATA:  Thoracic aortic aneurysm without rupture.  EXAM: CT ANGIOGRAPHY CHEST WITH CONTRAST  TECHNIQUE: Multidetector CT imaging of the chest was performed using the standard protocol during bolus administration of intravenous contrast. Multiplanar CT image reconstructions and MIPs were obtained to evaluate the vascular anatomy.  CONTRAST:  75mL ISOVUE-370 IOPAMIDOL (ISOVUE-370) INJECTION 76%  COMPARISON:  CT scan of January 24, 2017.  FINDINGS: Cardiovascular: Grossly stable 5.4 cm ascending thoracic aortic aneurysm is noted. No dissection is noted. Great vessels are widely patent without significant stenosis. Transverse aortic arch measures 2.9 cm. Proximal descending thoracic aorta measures 3.0 cm. Normal cardiac size. No pericardial effusion.  Mediastinum/Nodes: No enlarged mediastinal, hilar, or axillary lymph nodes. Thyroid gland, trachea, and esophagus demonstrate no significant findings.  Lungs/Pleura: Lungs are clear. No pleural effusion or pneumothorax.  Upper Abdomen: No acute abnormality.  Musculoskeletal: No chest wall abnormality. No acute or significant osseous findings.  Review of the MIP images confirms the above findings.  IMPRESSION: Stable 5.4 cm ascending thoracic aortic aneurysm. Recommend semi-annual imaging followup by CTA or MRA and referral to cardiothoracic surgery if not already obtained. This recommendation follows 2010 ACCF/AHA/AATS/ACR/ASA/SCA/SCAI/SIR/STS/SVM Guidelines for the Diagnosis and Management of Patients With Thoracic Aortic Disease. Circulation. 2010; 121: Z610-R604: [id number].   Electronically Signed   By: Lupita RaiderJames  Green Jr, M.D.   On: 12/09/2017 12:18   Impression:  This gentleman has  a bicuspid  aortic valve with moderate aortic insufficiency and a 5.4 cm fusiform ascending aortic aneurysm.  He also has severe left ventricular systolic dysfunction with ejection fraction of 25 to 30% with diffuse hypokinesis with left ventricular dilation.  Mean gradient across the aortic valve was only 8 mmHg which is less that I would expect given the appearance of his valve with restricted leaflet mobility.  I suspect that his aortic stenosis is probably more significant then reflected in the mean gradient given his low ejection fraction.  I think his aortic insufficiency is at least moderate if not severe visually and the pressure half-time is 366 ms.  With a bicuspid aortic valve his ascending aortic aneurysm is already above the surgical threshold.  I think the best option for this patient would be replacement of his aortic valve and ascending aortic aneurysm which will decrease his risk of aortic dissection and hopefully prevent further left ventricular deterioration due to the aortic stenosis and regurgitation.  His operative risk would certainly be increased due to his severe left ventricular dysfunction I do not think there is any other good option for him.  He will require cardiac catheterization prior to surgical treatment to evaluate his coronary arteries.  This will rule out ischemia as a cause of his left ventricular dysfunction.  I reviewed the CTA images and echocardiogram images with him and answered his questions.  He would like to proceed with further work-up and surgical treatment.  Plan:  We will ask Dr. Royann Shivers to schedule a cardiac catheterization and then I will see the patient back to review those findings with him and make final surgical plans.  I spent 15 minutes performing this established patient evaluation and > 50% of this time was spent face to face counseling and coordinating the care of this patient's bicuspid aortic valve and aortic aneurysm.    Alleen Borne, MD [facility] [phone]

## 2017-12-11 NOTE — Progress Notes (Signed)
Discussed cardiac catheterization in detail by phone: the procedure has been fully reviewed with the patient and written informed consent has been obtained. He can come in today for labs and scheduled for diagnostic cardiac cath Friday 12th at 12:00 noon with Dr. Peter SwazilandJordan. [facility].  Thurmon FairMihai Tydarius Yawn, MD, [facility] [phone] office [phone] pager

## 2017-12-15 ENCOUNTER — Ambulatory Visit (INDEPENDENT_AMBULATORY_CARE_PROVIDER_SITE_OTHER): Payer: Self-pay | Admitting: Family Medicine

## 2017-12-15 ENCOUNTER — Encounter: Payer: Self-pay | Admitting: Family Medicine

## 2017-12-15 ENCOUNTER — Telehealth: Payer: Self-pay | Admitting: Cardiovascular Disease

## 2017-12-15 ENCOUNTER — Telehealth: Payer: Self-pay

## 2017-12-15 ENCOUNTER — Other Ambulatory Visit: Payer: Self-pay

## 2017-12-15 VITALS — BP 136/82 | HR 60 | Temp 98.0°F | Ht 75.0 in | Wt 263.0 lb

## 2017-12-15 DIAGNOSIS — I639 Cerebral infarction, unspecified: Secondary | ICD-10-CM

## 2017-12-15 DIAGNOSIS — Z09 Encounter for follow-up examination after completed treatment for conditions other than malignant neoplasm: Secondary | ICD-10-CM

## 2017-12-15 DIAGNOSIS — I7121 Aneurysm of the ascending aorta, without rupture: Secondary | ICD-10-CM

## 2017-12-15 DIAGNOSIS — N390 Urinary tract infection, site not specified: Secondary | ICD-10-CM

## 2017-12-15 DIAGNOSIS — I10 Essential (primary) hypertension: Secondary | ICD-10-CM

## 2017-12-15 DIAGNOSIS — Q231 Congenital insufficiency of aortic valve: Secondary | ICD-10-CM

## 2017-12-15 DIAGNOSIS — R319 Hematuria, unspecified: Secondary | ICD-10-CM

## 2017-12-15 DIAGNOSIS — Z01812 Encounter for preprocedural laboratory examination: Secondary | ICD-10-CM

## 2017-12-15 DIAGNOSIS — I712 Thoracic aortic aneurysm, without rupture: Secondary | ICD-10-CM

## 2017-12-15 DIAGNOSIS — R8290 Unspecified abnormal findings in urine: Secondary | ICD-10-CM

## 2017-12-15 LAB — POCT URINALYSIS DIP (MANUAL ENTRY)
Bilirubin, UA: NEGATIVE
Glucose, UA: NEGATIVE mg/dL
Ketones, POC UA: NEGATIVE mg/dL
Nitrite, UA: NEGATIVE
Protein Ur, POC: 30 mg/dL — AB
Spec Grav, UA: 1.025 (ref 1.010–1.025)
Urobilinogen, UA: 0.2 E.U./dL
pH, UA: 5 (ref 5.0–8.0)

## 2017-12-15 MED ORDER — APIXABAN 5 MG PO TABS: 5.0000 mg | ORAL_TABLET | Freq: Two times a day (BID) | ORAL | 2 refills | Status: DC

## 2017-12-15 MED ORDER — CARVEDILOL 3.125 MG PO TABS: ORAL_TABLET | ORAL | 2 refills | Status: DC

## 2017-12-15 MED ORDER — SULFAMETHOXAZOLE-TRIMETHOPRIM 800-160 MG PO TABS: 1.0000 | ORAL_TABLET | Freq: Two times a day (BID) | ORAL | 0 refills | Status: DC

## 2017-12-15 MED ORDER — DIGOXIN 125 MCG PO TABS: 125.0000 ug | ORAL_TABLET | Freq: Every day | ORAL | 2 refills | Status: DC

## 2017-12-15 MED FILL — !ELIQUIS 5 MG TABLET: 5 | 30 days supply | Qty: 60 | Fill #0

## 2017-12-15 MED FILL — SULFAMETHOXAZOLE-TMP DS TAB: 800-160 | 10 days supply | Qty: 20 | Fill #0

## 2017-12-15 MED FILL — DIGITEK 125 MCG TABLET: 125 | 30 days supply | Qty: 30 | Fill #0

## 2017-12-15 MED FILL — CARVEDILOL 3.125 MG TABLET: 3.125 | 30 days supply | Qty: 30 | Fill #0
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
## 2017-12-15 NOTE — Telephone Encounter (Signed)
Returned call to patient's sister, DPR. She states patient told her he is to have a heart cath but could not recall the details. She is following up on this. Appears that Dr. Salena Saner spoke with patient on 7/3 and scheduled this. Pre-procedure labs have been ordered and patient will come today, per sister. Instruction letter printed and left for patient to pick up at front desk.  Staff message sent to Katina DungAnne Lankford, procedure nurse and pre-cert

## 2017-12-15 NOTE — Telephone Encounter (Signed)
New Message:    Linda(sister) called,she would like for somebody to call her back this morning please..She said somebody called pt last week, he did not remember who it was. She think it was concerning his Cath.

## 2017-12-15 NOTE — Telephone Encounter (Signed)
Opened in error

## 2017-12-16 ENCOUNTER — Telehealth: Payer: Self-pay | Admitting: Cardiovascular Disease

## 2017-12-16 ENCOUNTER — Telehealth: Payer: Self-pay | Admitting: *Deleted

## 2017-12-16 LAB — BASIC METABOLIC PANEL
BUN/Creatinine Ratio: 15 (ref 9–20)
BUN: 18 mg/dL (ref 6–24)
CALCIUM: 9.4 mg/dL (ref 8.7–10.2)
CO2: 25 mmol/L (ref 20–29)
Chloride: 101 mmol/L (ref 96–106)
Creatinine, Ser: 1.22 mg/dL (ref 0.76–1.27)
GFR calc Af Amer: 75 mL/min/{1.73_m2} (ref >59)
GFR calc non Af Amer: 65 mL/min/{1.73_m2} (ref >59)
GLUCOSE: 77 mg/dL (ref 65–99)
Potassium: 4.8 mmol/L (ref 3.5–5.2)
SODIUM: 141 mmol/L (ref 134–144)

## 2017-12-16 LAB — CBC
HEMOGLOBIN: 16.4 g/dL (ref 13.0–17.7)
Hematocrit: 49.8 % (ref 37.5–51.0)
MCH: 28.6 pg (ref 26.6–33.0)
MCHC: 32.9 g/dL (ref 31.5–35.7)
MCV: 87 fL (ref 79–97)
Platelets: 236 10*3/uL (ref 150–450)
RBC: 5.73 x10E6/uL (ref 4.14–5.80)
RDW: 13.8 % (ref 12.3–15.4)
WBC: 7.4 10*3/uL (ref 3.4–10.8)

## 2017-12-16 NOTE — Telephone Encounter (Signed)
Pt [facility]: Friday December 18, 2017 12 noon [facility] at: 10 AM  No solid food after midnight prior to cath, clear liquids until 5 AM day of procedure. Verified no known allergies. Verified no diabetes medications.  Hold: Apixaban -12/17/17 until post procedure. (hx of CVA-okay with Dr Royann Shiversroitoru and Dr SwazilandJordan) Furosemide AM of procedure. KCl AM of procedure.  AM meds can be  taken pre-cath with sip of water including: ASA 81 mg  Confirmed patient has responsible person to drive home post procedure and for 24 hours after you arrive home: yes  Discussed instructions with patient's sister, [facility](DPR), she verbalized understanding.

## 2017-12-16 NOTE — Telephone Encounter (Signed)
Called patient advised that Caleb DungAnne Lankford, RN had called and spoken with sister [facility] instructions for upcoming procedure instructions. Patient understood and had no questions or concerns.

## 2017-12-16 NOTE — Telephone Encounter (Signed)
New Message:     Pt is returning a call but pt is unsure as of to why we are calling. Pt states to leave a detailed msg the next time we call.

## 2017-12-17 LAB — URINE CULTURE: Organism ID, Bacteria: NO GROWTH
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
## 2017-12-18 ENCOUNTER — Encounter (HOSPITAL_COMMUNITY)
Admission: RE | Disposition: A | Discharge status: Home / Self Care | Payer: Self-pay | Source: Ambulatory Visit | Attending: Cardiology

## 2017-12-18 ENCOUNTER — Encounter (HOSPITAL_COMMUNITY): Payer: Self-pay | Admitting: Cardiology

## 2017-12-18 ENCOUNTER — Ambulatory Visit (HOSPITAL_COMMUNITY)
Admission: RE | Admit: 2017-12-18 | Discharge: 2017-12-18 | Disposition: A | Discharge status: Home / Self Care | Payer: No Typology Code available for payment source | Source: Ambulatory Visit | Attending: Cardiology | Admitting: Cardiology

## 2017-12-18 DIAGNOSIS — Q2381 Bicuspid aortic valve: Secondary | ICD-10-CM

## 2017-12-18 DIAGNOSIS — I639 Cerebral infarction, unspecified: Secondary | ICD-10-CM

## 2017-12-18 DIAGNOSIS — I5042 Chronic combined systolic (congestive) and diastolic (congestive) heart failure: Secondary | ICD-10-CM | POA: Insufficient documentation

## 2017-12-18 DIAGNOSIS — I110 Hypertensive heart disease with heart failure: Secondary | ICD-10-CM | POA: Insufficient documentation

## 2017-12-18 DIAGNOSIS — I4891 Unspecified atrial fibrillation: Secondary | ICD-10-CM | POA: Insufficient documentation

## 2017-12-18 DIAGNOSIS — Z8673 Personal history of transient ischemic attack (TIA), and cerebral infarction without residual deficits: Secondary | ICD-10-CM | POA: Insufficient documentation

## 2017-12-18 DIAGNOSIS — Q231 Congenital insufficiency of aortic valve: Secondary | ICD-10-CM | POA: Insufficient documentation

## 2017-12-18 DIAGNOSIS — I2510 Atherosclerotic heart disease of native coronary artery without angina pectoris: Secondary | ICD-10-CM | POA: Insufficient documentation

## 2017-12-18 DIAGNOSIS — I7121 Aneurysm of the ascending aorta, without rupture: Secondary | ICD-10-CM

## 2017-12-18 DIAGNOSIS — Z7901 Long term (current) use of anticoagulants: Secondary | ICD-10-CM | POA: Insufficient documentation

## 2017-12-18 DIAGNOSIS — I712 Thoracic aortic aneurysm, without rupture: Secondary | ICD-10-CM

## 2017-12-18 DIAGNOSIS — Z7982 Long term (current) use of aspirin: Secondary | ICD-10-CM | POA: Insufficient documentation

## 2017-12-18 DIAGNOSIS — I420 Dilated cardiomyopathy: Secondary | ICD-10-CM | POA: Insufficient documentation

## 2017-12-18 DIAGNOSIS — I340 Nonrheumatic mitral (valve) insufficiency: Secondary | ICD-10-CM | POA: Insufficient documentation

## 2017-12-18 DIAGNOSIS — Z8249 Family history of ischemic heart disease and other diseases of the circulatory system: Secondary | ICD-10-CM | POA: Insufficient documentation

## 2017-12-18 HISTORY — PX: RIGHT/LEFT HEART CATH AND CORONARY ANGIOGRAPHY: CATH118266

## 2017-12-18 LAB — POCT I-STAT 3, ART BLOOD GAS (G3+)
Acid-base deficit: 1 mmol/L (ref 0.0–2.0)
BICARBONATE: 24.7 mmol/L (ref 20.0–28.0)
O2 SAT: 98 %
PCO2 ART: 43.1 mmHg (ref 32.0–48.0)
PH ART: 7.367 (ref 7.350–7.450)
PO2 ART: 113 mmHg — AB (ref 83.0–108.0)
TCO2: 26 mmol/L (ref 22–32)

## 2017-12-18 LAB — POCT I-STAT 3, VENOUS BLOOD GAS (G3P V)
BICARBONATE: 26.3 mmol/L (ref 20.0–28.0)
O2 SAT: 72 %
PO2 VEN: 40 mmHg (ref 32.0–45.0)
TCO2: 28 mmol/L (ref 22–32)
pCO2, Ven: 48.6 mmHg (ref 44.0–60.0)
pH, Ven: 7.342 (ref 7.250–7.430)

## 2017-12-18 SURGERY — RIGHT/LEFT HEART CATH AND CORONARY ANGIOGRAPHY
Anesthesia: LOCAL

## 2017-12-18 MED ORDER — HEPARIN SODIUM (PORCINE) 1000 UNIT/ML IJ SOLN
INTRAMUSCULAR | Status: DC | PRN
  Administered 2017-12-18: 5000 [IU] via INTRAVENOUS

## 2017-12-18 MED ORDER — VERAPAMIL HCL 2.5 MG/ML IV SOLN
INTRAVENOUS | Status: DC | PRN
  Administered 2017-12-18: 10 mL via INTRA_ARTERIAL

## 2017-12-18 MED ORDER — VERAPAMIL HCL 2.5 MG/ML IV SOLN
INTRAVENOUS | Status: AC
  Filled 2017-12-18: qty 2

## 2017-12-18 MED ORDER — ACETAMINOPHEN 325 MG PO TABS: 650.0000 mg | ORAL_TABLET | ORAL | Status: DC | PRN

## 2017-12-18 MED ORDER — ASPIRIN 81 MG PO CHEW: 81.0000 mg | CHEWABLE_TABLET | ORAL | Status: DC

## 2017-12-18 MED ORDER — SODIUM CHLORIDE 0.9% FLUSH: 3.0000 mL | INTRAVENOUS | Status: DC | PRN

## 2017-12-18 MED ORDER — HEPARIN (PORCINE) IN NACL 1000-0.9 UT/500ML-% IV SOLN
INTRAVENOUS | Status: DC | PRN
  Administered 2017-12-18: 500 mL

## 2017-12-18 MED ORDER — HEPARIN (PORCINE) IN NACL 1000-0.9 UT/500ML-% IV SOLN
INTRAVENOUS | Status: AC
  Filled 2017-12-18: qty 1000

## 2017-12-18 MED ORDER — SODIUM CHLORIDE 0.9 % WEIGHT BASED INFUSION: 1.0000 mL/kg/h | INTRAVENOUS | Status: AC

## 2017-12-18 MED ORDER — LIDOCAINE HCL (PF) 1 % IJ SOLN
INTRAMUSCULAR | Status: AC
  Filled 2017-12-18: qty 30

## 2017-12-18 MED ORDER — IOPAMIDOL (ISOVUE-370) INJECTION 76%
INTRAVENOUS | Status: DC | PRN
  Administered 2017-12-18: 100 mL via INTRA_ARTERIAL

## 2017-12-18 MED ORDER — IOPAMIDOL (ISOVUE-370) INJECTION 76%
INTRAVENOUS | Status: AC
  Filled 2017-12-18: qty 50

## 2017-12-18 MED ORDER — SODIUM CHLORIDE 0.9 % IV SOLN: 250.0000 mL | INTRAVENOUS | Status: DC | PRN

## 2017-12-18 MED ORDER — MIDAZOLAM HCL 2 MG/2ML IJ SOLN
INTRAMUSCULAR | Status: AC
  Filled 2017-12-18: qty 2

## 2017-12-18 MED ORDER — MIDAZOLAM HCL 2 MG/2ML IJ SOLN
INTRAMUSCULAR | Status: DC | PRN
  Administered 2017-12-18: 2 mg via INTRAVENOUS

## 2017-12-18 MED ORDER — SODIUM CHLORIDE 0.9 % IV SOLN
INTRAVENOUS | Status: DC
  Administered 2017-12-18: 10:00:00 via INTRAVENOUS

## 2017-12-18 MED ORDER — FENTANYL CITRATE (PF) 100 MCG/2ML IJ SOLN
INTRAMUSCULAR | Status: AC
  Filled 2017-12-18: qty 2

## 2017-12-18 MED ORDER — SODIUM CHLORIDE 0.9% FLUSH: 3.0000 mL | Freq: Two times a day (BID) | INTRAVENOUS | Status: DC

## 2017-12-18 MED ORDER — HEPARIN SODIUM (PORCINE) 1000 UNIT/ML IJ SOLN
INTRAMUSCULAR | Status: AC
  Filled 2017-12-18: qty 1

## 2017-12-18 MED ORDER — LIDOCAINE HCL (PF) 1 % IJ SOLN
INTRAMUSCULAR | Status: DC | PRN
  Administered 2017-12-18: 3 mL
  Administered 2017-12-18: 2 mL

## 2017-12-18 MED ORDER — IOPAMIDOL (ISOVUE-370) INJECTION 76%
INTRAVENOUS | Status: AC
  Filled 2017-12-18: qty 100

## 2017-12-18 MED ORDER — APIXABAN 5 MG PO TABS: 5.0000 mg | ORAL_TABLET | Freq: Two times a day (BID) | ORAL | 2 refills | Status: DC

## 2017-12-18 MED ORDER — FENTANYL CITRATE (PF) 100 MCG/2ML IJ SOLN
INTRAMUSCULAR | Status: DC | PRN
  Administered 2017-12-18: 25 ug via INTRAVENOUS

## 2017-12-18 MED ORDER — ONDANSETRON HCL 4 MG/2ML IJ SOLN: 4.0000 mg | Freq: Four times a day (QID) | INTRAMUSCULAR | Status: DC | PRN

## 2017-12-18 SURGICAL SUPPLY — 17 items
CATH BALLN WEDGE 5F 110CM (CATHETERS) ×2 IMPLANT
CATH INFINITI 5 FR JL3.5 (CATHETERS) ×2 IMPLANT
CATH INFINITI 5 FR MPA2 (CATHETERS) ×2 IMPLANT
CATH INFINITI 5F JL4 125CM (CATHETERS) ×2 IMPLANT
CATH INFINITI 5F PIG 125CM (CATHETERS) ×2 IMPLANT
CATH INFINITI JR4 5F (CATHETERS) ×2 IMPLANT
DEVICE RAD COMP TR BAND LRG (VASCULAR PRODUCTS) ×2 IMPLANT
GLIDESHEATH SLEND SS 6F .021 (SHEATH) ×2 IMPLANT
GUIDEWIRE INQWIRE 1.5J.035X260 (WIRE) ×1 IMPLANT
INQWIRE 1.5J .035X260CM (WIRE) ×2
KIT HEART LEFT (KITS) ×2 IMPLANT
PACK CARDIAC CATHETERIZATION (CUSTOM PROCEDURE TRAY) ×2 IMPLANT
SHEATH GLIDE SLENDER 4/5FR (SHEATH) ×2 IMPLANT
SYR MEDRAD MARK V 150ML (SYRINGE) ×2 IMPLANT
TRANSDUCER W/STOPCOCK (MISCELLANEOUS) ×2 IMPLANT
TUBING CIL FLEX 10 FLL-RA (TUBING) ×2 IMPLANT
WIRE EMERALD 3MM-J .025X260CM (WIRE) ×2 IMPLANT

## 2017-12-18 NOTE — Discharge Instructions (Addendum)
May resume Eliquis tomorrow am 12/19/17  Drink plenty of fluids over next 48 hours and keep right wrist elevated at heart level for 24 hours  Radial Site Care Refer to this sheet in the next few weeks. These instructions provide you with information about caring for yourself after your procedure. Your health care provider may also give you more specific instructions. Your treatment has been planned according to current medical practices, but problems sometimes occur. Call your health care provider if you have any problems or questions after your procedure. What can I expect after the procedure? After your procedure, it is typical to have the following:  Bruising at the radial site that usually fades within 1-2 weeks.  Blood collecting in the tissue (hematoma) that may be painful to the touch. It should usually decrease in size and tenderness within 1-2 weeks.  Follow these instructions at home:  Take medicines only as directed by your health care provider.  You may shower 24-48 hours after the procedure or as directed by your health care provider. Remove the bandage (dressing) and gently wash the site with plain soap and water. Pat the area dry with a clean towel. Do not rub the site, because this may cause bleeding.  Do not take baths, swim, or use a hot tub until your health care provider approves.  Check your insertion site every day for redness, swelling, or drainage.  Do not apply powder or lotion to the site.  Do not flex or bend the affected arm for 24 hours or as directed by your health care provider.  Do not push or pull heavy objects with the affected arm for 24 hours or as directed by your health care provider.  Do not lift over 10 lb (4.5 kg) for 5 days after your procedure or as directed by your health care provider.  Ask your health care provider when it is okay to: ? Return to work or school. ? Resume usual physical activities or sports. ? Resume sexual activity.  Do  not drive home if you are discharged the same day as the procedure. Have someone else drive you.  You may drive 24 hours after the procedure unless otherwise instructed by your health care provider.  Do not operate machinery or power tools for 24 hours after the procedure.  If your procedure was done as an outpatient procedure, which means that you went home the same day as your procedure, a responsible adult should be with you for the first 24 hours after you arrive home.  Keep all follow-up visits as directed by your health care provider. This is important. Contact a health care provider if:  You have a fever.  You have chills.  You have increased bleeding from the radial site. Hold pressure on the site. Get help right away if:  You have unusual pain at the radial site.  You have redness, warmth, or swelling at the radial site.  You have drainage (other than a small amount of blood on the dressing) from the radial site.  The radial site is bleeding, and the bleeding does not stop after 30 minutes of holding steady pressure on the site.  Your arm or hand becomes pale, cool, tingly, or numb. This information is not intended to replace advice given to you by your health care provider. Make sure you discuss any questions you have with your health care provider. Document Released: 06/28/2010 Document Revised: 11/01/2015 Document Reviewed: 12/12/2013 Elsevier Interactive Patient Education  2018 Elsevier  Inc. ° °

## 2017-12-18 NOTE — Interval H&P Note (Signed)
History and Physical Interval Note:  12/18/2017 12:26 PM  Caleb [name]  has presented today for surgery, with the diagnosis of cp  The various methods of treatment have been discussed with the patient and family. After consideration of risks, benefits and other options for treatment, the patient has consented to  Procedure(s): RIGHT/LEFT HEART CATH AND CORONARY ANGIOGRAPHY (N/A) as a surgical intervention .  The patient's history has been reviewed, patient examined, no change in status, stable for surgery.  I have reviewed the patient's chart and labs.  Questions were answered to the patient's satisfaction.     Theron Aristaeter [facility] HospitalJordanMD,FACC 12/18/2017 12:26 PM

## 2017-12-21 ENCOUNTER — Other Ambulatory Visit: Payer: Self-pay

## 2017-12-21 ENCOUNTER — Other Ambulatory Visit: Payer: Self-pay | Admitting: *Deleted

## 2017-12-21 ENCOUNTER — Ambulatory Visit (INDEPENDENT_AMBULATORY_CARE_PROVIDER_SITE_OTHER): Payer: No Typology Code available for payment source | Admitting: Surgery

## 2017-12-21 ENCOUNTER — Encounter: Payer: Self-pay | Admitting: Surgery

## 2017-12-21 ENCOUNTER — Encounter: Payer: Self-pay | Admitting: *Deleted

## 2017-12-21 VITALS — BP 124/80 | HR 58 | Resp 16 | Ht 75.0 in | Wt 263.0 lb

## 2017-12-21 DIAGNOSIS — I7121 Aneurysm of the ascending aorta, without rupture: Secondary | ICD-10-CM

## 2017-12-21 DIAGNOSIS — I350 Nonrheumatic aortic (valve) stenosis: Secondary | ICD-10-CM

## 2017-12-21 DIAGNOSIS — I712 Thoracic aortic aneurysm, without rupture: Secondary | ICD-10-CM

## 2017-12-21 DIAGNOSIS — I4819 Other persistent atrial fibrillation: Secondary | ICD-10-CM

## 2017-12-21 DIAGNOSIS — Q231 Congenital insufficiency of aortic valve: Secondary | ICD-10-CM

## 2017-12-21 DIAGNOSIS — I5022 Chronic systolic (congestive) heart failure: Secondary | ICD-10-CM

## 2017-12-21 DIAGNOSIS — I351 Nonrheumatic aortic (valve) insufficiency: Secondary | ICD-10-CM

## 2017-12-21 DIAGNOSIS — I481 Persistent atrial fibrillation: Secondary | ICD-10-CM

## 2017-12-21 NOTE — Progress Notes (Signed)
HPI:  The patient returns today to review the results of his recent cardiac catheterization and to make surgical plans.  He has a bicuspid aortic valve with moderate aortic insufficiency and a 5.4 cm fusiform ascending aortic aneurysm.  Left ventricular systolic function is severely reduced with ejection fraction of 25 to 30% with diffuse hypokinesis and left ventricular dilation.  Cardiac catheterization on 12/18/2017 by Dr. Swaziland showed a left ventricular ejection fraction of 25 to 30% with global hypokinesis.  There is nonobstructive coronary disease.  There were normal left ventricular filling pressures and normal right heart pressures.  There is no significant gradient measured across aortic valve.  Current Outpatient Medications  Medication Sig Dispense Refill  . apixaban (ELIQUIS) 5 MG TABS tablet Take 1 tablet (5 mg total) by mouth 2 (two) times daily. 60 tablet 2  . aspirin EC 81 MG EC tablet Take 1 tablet (81 mg total) by mouth daily. 30 tablet 1  . carvedilol (COREG) 3.125 MG tablet TAKE 3.125MG   TABLET BY MOUTH ONCE DAILY 60 tablet 2  . Cholecalciferol (VITAMIN D PO) Take 5,000 Units by mouth daily.     . digoxin (LANOXIN) 0.125 MG tablet Take 1 tablet (125 mcg total) by mouth daily. 30 tablet 2  . furosemide (LASIX) 40 MG tablet TAKE HALF A TABLET EVERY DAY 15 tablet 3  . potassium chloride SA (K-DUR,KLOR-CON) 20 MEQ tablet TAKE 1 TABLET BY MOUTH EVERY DAY 30 tablet 6  . sacubitril-valsartan (ENTRESTO) 49-51 MG Take 1 tablet by mouth 2 (two) times daily. 180 tablet 1   No current facility-administered medications for this visit.      Physical Exam: BP 124/80 (BP Location: Left Arm, Patient Position: Sitting, Cuff Size: Large)   Pulse (!) 58   Resp 16   Ht 6\' 3"  (1.905 m)   Wt 263 lb (119.3 kg)   SpO2 99% Comment: ON RA  BMI 32.87 kg/m  He looks well. Cardiac exam shows an irregular rate and rhythm with a 2/6 systolic murmur along the right sternal border and a 2/6  diastolic murmur along the left sternal border. Lungs are clear There is no peripheral edema  Diagnostic Tests:  Physicians   Panel Physicians Referring Physician Case Authorizing Physician  Swaziland, Peter M, MD (Primary)    Procedures   RIGHT/LEFT HEART CATH AND CORONARY ANGIOGRAPHY  Conclusion     There is severe left ventricular systolic dysfunction.  LV end diastolic pressure is normal.  The left ventricular ejection fraction is 25-35% by visual estimate.   1. Nonobstructive CAD 2. Severe LV dysfunction. EF 25-30% with global hypokinesis 3. Normal LV filling pressures 4. Normal right heart pressures 5. No significant AV gradient  Plan: Surgery for thoracic Aortic aneurysm and bicuspid AV.    Indications   Thoracic aortic aneurysm without rupture (HCC) [I71.2 (ICD-10-CM)]  Procedural Details/Technique   Technical Details Indication: 57 yo WM with dilated CM, bicuspid AV and ascending aortic aneurysm- for pre op evaluation  Procedural Details: The right wrist was prepped, draped, and anesthetized with 1% lidocaine. Using the modified Seldinger technique a 6 Fr slender sheath was placed in the right radial artery and a 5 French sheath was placed in the right brachial vein. A Swan-Ganz catheter was used for the right heart catheterization. Standard protocol was followed for recording of right heart pressures and sampling of oxygen saturations. Fick cardiac output was calculated. Standard Judkins catheters were used for selective coronary angiography and left ventriculography  except a MP A2 was used to engage the RCA. There were no immediate procedural complications. The patient was transferred to the post catheterization recovery area for further monitoring. Contrast: 100 cc   Estimated blood loss <50 mL.  During this procedure the patient was administered the following to achieve and maintain moderate conscious sedation: Versed 2 mg, Fentanyl 25 mcg, while the patient's  heart rate, blood pressure, and oxygen saturation were continuously monitored. The period of conscious sedation was 36 minutes, of which I was present face-to-face 100% of this time.  Complications   Complications documented before study signed (12/18/2017 1:32 PM EDT)    No complications were associated with this study.  Documented by SwazilandJordan, Peter M, MD - 12/18/2017 1:24 PM EDT    Coronary Findings   Diagnostic  Dominance: Right  Left Main  Vessel was injected. Vessel is normal in caliber. Vessel is angiographically normal.  Left Anterior Descending  Vessel was injected. Vessel is normal in caliber. There is mild focal disease in the vessel.  Left Circumflex  Vessel was injected. Vessel is normal in caliber. Vessel is angiographically normal. There is a terminal LCx branch that arises anomalously from the RCA.  Right Coronary Artery  Vessel was injected. Vessel is very large. There is mild diffuse disease throughout the vessel.  Intervention   No interventions have been documented.  Wall Motion      All segments of the heart are hypokinetic.          Left Heart   Left Ventricle The left ventricle is moderately dilated. There is severe left ventricular systolic dysfunction. LV end diastolic pressure is normal. The left ventricular ejection fraction is 25-35% by visual estimate. There are LV function abnormalities due to global hypokinesis.  Coronary Diagrams   Diagnostic Diagram       Implants    No implant documentation for this case.  MERGE Images   Show images for CARDIAC CATHETERIZATION   Link to Procedure Log   Procedure Log    Hemo Data    Most Recent Value  Fick Cardiac Output 5.65 L/min  Fick Cardiac Output Index 2.29 (L/min)/BSA  RA A Wave -99 mmHg  RA V Wave 5 mmHg  RA Mean 4 mmHg  RV Systolic Pressure 27 mmHg  RV Diastolic Pressure 0 mmHg  RV EDP 5 mmHg  PA Systolic Pressure 27 mmHg  PA Diastolic Pressure 10 mmHg  PA Mean 17 mmHg  PW A Wave  -99 mmHg  PW V Wave 12 mmHg  PW Mean 8 mmHg  AO Systolic Pressure 118 mmHg  AO Diastolic Pressure 76 mmHg  AO Mean 93 mmHg  LV Systolic Pressure 133 mmHg  LV Diastolic Pressure 7 mmHg  LV EDP 10 mmHg  AOp Systolic Pressure 127 mmHg  AOp Diastolic Pressure 72 mmHg  AOp Mean Pressure 93 mmHg  LVp Systolic Pressure 126 mmHg  LVp Diastolic Pressure 7 mmHg  LVp EDP Pressure 11 mmHg  QP/QS 1  TPVR Index 7.41 HRUI  TSVR Index 40.54 HRUI  PVR SVR Ratio 0.1  TPVR/TSVR Ratio 0.18     Impression:  This gentleman has a bicuspid aortic valve with moderate aortic insufficiency and a 5.4 cm fusiform ascending aortic aneurysm.  He also has severe left ventricular systolic dysfunction with ejection fraction of 25 to 30% with diffuse hypokinesis with left ventricular dilation.  He has New York Heart Association class II symptoms of exertional fatigue and shortness of breath as well as occasional orthopnea  consistent with chronic diastolic congestive heart failure.  I think the best option for this patient is replacement of his aortic valve and ascending aortic aneurysm to decrease the risk of aortic dissection and to prevent further left ventricular deterioration due to his aortic stenosis and regurgitation.  I reviewed the echocardiogram and cardiac catheterization findings again with him and the indications for surgery.  He would like to proceed.  We discussed the alternatives of mechanical and bioprosthetic valves.  He has had persistent atrial fibrillation and has been on Eliquis.  He has had prior strokes, one of which occurred last year while on Eliquis.  He does not want to be on Coumadin due to the required follow-up to monitor the adequacy of anticoagulation.  He travels a lot and does not want to have to watch his diet.  He would like to use a bioprosthetic valve and remain on Eliquis for his atrial fibrillation.  I think that is a reasonable alternative given his age,  left ventricular dysfunction,  and aversion to Coumadin with the possibility that he may not be completely compliant.  I would also plan to place a clip on the left atrial appendage.  I discussed the operative procedure with the patient  including alternatives, benefits and risks; including but not limited to bleeding, blood transfusion, infection, stroke, myocardial infarction, graft failure, heart block requiring a permanent pacemaker, organ dysfunction, and death.  Caleb [name] understands and agrees to proceed.     Plan:  He will be scheduled for Bentall procedure and replacement of the ascending aortic aneurysm using hypothermic circulatory arrest with a bioprosthetic aortic valve and clipping of the left atrial appendage on 01/08/2018.  I spent 15 minutes performing this established patient evaluation and > 50% of this time was spent face to face counseling and coordinating the care of this patient's bicuspid aortic valve and aortic aneurysm.    Alleen Borne, MD [facility] [phone]

## 2017-12-22 MED FILL — POTASSIUM CL ER 20 MEQ TAB: 20 | 30 days supply | Qty: 30 | Fill #0

## 2017-12-24 ENCOUNTER — Other Ambulatory Visit: Payer: Self-pay

## 2017-12-25 NOTE — Telephone Encounter (Signed)
Patient is wanting a refill on Lasix but it was not prescribed by you. Please advise

## 2017-12-27 MED ORDER — FUROSEMIDE 40 MG PO TABS: ORAL_TABLET | ORAL | 3 refills | Status: DC

## 2017-12-30 ENCOUNTER — Encounter (HOSPITAL_COMMUNITY): Payer: No Typology Code available for payment source

## 2018-01-05 NOTE — Pre-Procedure Instructions (Signed)
Davonna BellingSamir M Dill  01/05/2018      [name]- [place], [place] - [phone] [place] [address] Phone: [phone] Fax: [phone]  [id number] [address] [place] - [address] [place] [id number] Phone: [phone] Fax: [phone]  [facility] [place] [place] [address] Phone: [phone] Fax: [phone]    Your procedure is scheduled on Aug. 2  [facility]:30 [facility].M.  Call this number if you have problems the morning of surgery:  [phone]   Remember:  Do not eat or drink after midnight.                       Take these medicines the morning of surgery with A SIP OF WATER :              Carvedilol (coreg)             Digoxin (lanoxin)                 7 days prior to surgery STOP taking  Aleve, Naproxen, Ibuprofen, Motrin, Advil, Goody's, BC's, all herbal medications, fish oil, and all vitamins               Follow your surgeon's instructions on when to stop Asprin and eliquis.  If no instructions were given by your surgeon then you will need to call the office to get those instructions.      Do not wear jewelry.  Do not wear lotions, powders, or perfumes, or deodorant.  Do not shave 48 hours prior to surgery.  Men may shave face and neck.  Do not bring valuables to the hospital.  [facility] is not responsible for any belongings or valuables.  Contacts, dentures or bridgework may not be worn into surgery.  Leave your suitcase in the car.  After surgery it may be brought to your room.  For patients admitted to the hospital, discharge time will be determined by your treatment team.  Patients discharged the day of surgery will not be allowed to drive home.    Special instructions:  [place]- Preparing For Surgery  Before surgery, you can play an important role. Because skin is not  sterile, your skin needs to be as free of germs as possible. You can reduce the number of germs on your skin by washing with CHG (chlorahexidine gluconate) Soap before surgery.  CHG is an antiseptic cleaner which kills germs and bonds with the skin to continue killing germs even after washing.    Oral Hygiene is also important to reduce your risk of infection.  Remember - BRUSH YOUR TEETH THE MORNING OF SURGERY WITH YOUR REGULAR TOOTHPASTE  Please do not use if you have an allergy to CHG or antibacterial soaps. If your skin becomes reddened/irritated stop using the CHG.  Do not shave (including legs and underarms) for at least 48 hours prior to first CHG shower. It is OK to shave your face.  Please follow these instructions carefully.   1. Shower the NIGHT BEFORE SURGERY and the MORNING OF SURGERY with CHG.   2. If you chose to wash your hair, wash your hair first as usual with your normal shampoo.  3. After you shampoo, rinse your hair and body thoroughly to remove the shampoo.  4. Use CHG as you would any other liquid soap. You can apply CHG directly to the skin and wash gently with a scrungie or a clean washcloth.   5. Apply the CHG Soap to your body ONLY FROM THE NECK DOWN.  Do not use on open wounds or open sores. Avoid contact with your eyes, ears, mouth and genitals (private parts). Wash Face and genitals (private parts)  with your normal soap.  6. Wash thoroughly, paying special attention to the area where your surgery will be performed.  7. Thoroughly rinse your body with warm water from the neck down.  8. DO NOT shower/wash with your normal soap after using and rinsing off the CHG Soap.  9. Pat yourself dry with a CLEAN TOWEL.  10. Wear CLEAN PAJAMAS to bed the night before surgery, wear comfortable clothes the morning of surgery  11. Place CLEAN SHEETS on your bed the night of your first shower and DO NOT SLEEP WITH PETS.    Day of Surgery:  Do not apply any  deodorants/lotions.  Please wear clean clothes to the hospital/surgery center.   Remember to brush your teeth WITH YOUR REGULAR TOOTHPASTE.    Please read over the following fact sheets that you were given. Coughing and Deep Breathing, MRSA Information and Surgical Site Infection Prevention

## 2018-01-06 ENCOUNTER — Other Ambulatory Visit: Payer: Self-pay

## 2018-01-06 ENCOUNTER — Encounter (HOSPITAL_COMMUNITY): Payer: Self-pay

## 2018-01-06 ENCOUNTER — Ambulatory Visit (HOSPITAL_COMMUNITY)
Admission: RE | Admit: 2018-01-06 | Discharge: 2018-01-06 | Disposition: A | Discharge status: Home / Self Care | Payer: Self-pay | Source: Ambulatory Visit | Attending: Surgery | Admitting: Surgery

## 2018-01-06 ENCOUNTER — Encounter (HOSPITAL_COMMUNITY)
Admission: RE | Admit: 2018-01-06 | Discharge: 2018-01-06 | Disposition: A | Discharge status: Home / Self Care | Payer: Self-pay | Source: Ambulatory Visit | Attending: Surgery | Admitting: Surgery

## 2018-01-06 ENCOUNTER — Ambulatory Visit (HOSPITAL_BASED_OUTPATIENT_CLINIC_OR_DEPARTMENT_OTHER)
Admission: RE | Admit: 2018-01-06 | Discharge: 2018-01-06 | Disposition: A | Discharge status: Home / Self Care | Payer: Self-pay | Source: Ambulatory Visit | Attending: Surgery | Admitting: Surgery

## 2018-01-06 DIAGNOSIS — I350 Nonrheumatic aortic (valve) stenosis: Secondary | ICD-10-CM | POA: Insufficient documentation

## 2018-01-06 DIAGNOSIS — I517 Cardiomegaly: Secondary | ICD-10-CM | POA: Insufficient documentation

## 2018-01-06 DIAGNOSIS — I7121 Aneurysm of the ascending aorta, without rupture: Secondary | ICD-10-CM

## 2018-01-06 DIAGNOSIS — I351 Nonrheumatic aortic (valve) insufficiency: Secondary | ICD-10-CM

## 2018-01-06 DIAGNOSIS — I712 Thoracic aortic aneurysm, without rupture: Secondary | ICD-10-CM | POA: Insufficient documentation

## 2018-01-06 DIAGNOSIS — G5621 Lesion of ulnar nerve, right upper limb: Secondary | ICD-10-CM | POA: Insufficient documentation

## 2018-01-06 DIAGNOSIS — G5622 Lesion of ulnar nerve, left upper limb: Secondary | ICD-10-CM | POA: Insufficient documentation

## 2018-01-06 HISTORY — DX: Heart failure, unspecified: I50.9

## 2018-01-06 LAB — COMPREHENSIVE METABOLIC PANEL
ALK PHOS: 35 U/L — AB (ref 38–126)
ALT: 26 U/L (ref 0–44)
AST: 30 U/L (ref 15–41)
Albumin: 4.2 g/dL (ref 3.5–5.0)
Anion gap: 9 (ref 5–15)
BILIRUBIN TOTAL: 1.8 mg/dL — AB (ref 0.3–1.2)
BUN: 14 mg/dL (ref 6–20)
CALCIUM: 9.1 mg/dL (ref 8.9–10.3)
CO2: 24 mmol/L (ref 22–32)
CREATININE: 1.12 mg/dL (ref 0.61–1.24)
Chloride: 106 mmol/L (ref 98–111)
Glucose, Bld: 91 mg/dL (ref 70–99)
Potassium: 3.9 mmol/L (ref 3.5–5.1)
Sodium: 139 mmol/L (ref 135–145)
TOTAL PROTEIN: 7.5 g/dL (ref 6.5–8.1)

## 2018-01-06 LAB — TYPE AND SCREEN
ABO/RH(D): B POS
Antibody Screen: NEGATIVE

## 2018-01-06 LAB — URINALYSIS, ROUTINE W REFLEX MICROSCOPIC
BILIRUBIN URINE: NEGATIVE
Bacteria, UA: NONE SEEN
GLUCOSE, UA: NEGATIVE mg/dL
KETONES UR: NEGATIVE mg/dL
Leukocytes, UA: NEGATIVE
NITRITE: NEGATIVE
PH: 5 (ref 5.0–8.0)
Protein, ur: NEGATIVE mg/dL
Specific Gravity, Urine: 1.011 (ref 1.005–1.030)

## 2018-01-06 LAB — CBC
HCT: 54.2 % — ABNORMAL HIGH (ref 39.0–52.0)
Hemoglobin: 17.3 g/dL — ABNORMAL HIGH (ref 13.0–17.0)
MCH: 28.4 pg (ref 26.0–34.0)
MCHC: 31.9 g/dL (ref 30.0–36.0)
MCV: 88.9 fL (ref 78.0–100.0)
PLATELETS: 232 10*3/uL (ref 150–400)
RBC: 6.1 MIL/uL — ABNORMAL HIGH (ref 4.22–5.81)
RDW: 13.2 % (ref 11.5–15.5)
WBC: 7.4 10*3/uL (ref 4.0–10.5)

## 2018-01-06 LAB — HEMOGLOBIN A1C
HEMOGLOBIN A1C: 5.4 % (ref 4.8–5.6)
Mean Plasma Glucose: 108.28 mg/dL

## 2018-01-06 LAB — PULMONARY FUNCTION TEST
DL/VA % pred: 81 %
DL/VA: 3.99 ml/min/mmHg/L
DLCO UNC: 30.41 ml/min/mmHg
DLCO cor % pred: 74 %
DLCO cor: 29.04 ml/min/mmHg
DLCO unc % pred: 77 %
FEF 25-75 Pre: 2.35 L/sec
FEF2575-%Pred-Pre: 66 %
FEV1-%Pred-Pre: 72 %
FEV1-Pre: 3.13 L
FEV1FVC-%Pred-Pre: 83 %
FEV6-%Pred-Pre: 89 %
FEV6-PRE: 4.9 L
FEV6FVC-%Pred-Pre: 104 %
FVC-%Pred-Pre: 85 %
FVC-Pre: 4.9 L
PRE FEV1/FVC RATIO: 64 %
Pre FEV6/FVC Ratio: 100 %
RV % pred: 111 %
RV: 2.76 L
TLC % PRED: 99 %
TLC: 7.93 L

## 2018-01-06 LAB — SURGICAL PCR SCREEN
MRSA, PCR: NEGATIVE
Staphylococcus aureus: POSITIVE — AB

## 2018-01-06 LAB — PROTIME-INR
INR: 1.14
Prothrombin Time: 14.6 seconds (ref 11.4–15.2)

## 2018-01-06 LAB — APTT: aPTT: 30 seconds (ref 24–36)

## 2018-01-06 LAB — ABO/RH: ABO/RH(D): B POS

## 2018-01-06 MED ORDER — ALBUTEROL SULFATE (2.5 MG/3ML) 0.083% IN NEBU: 2.5000 mg | INHALATION_SOLUTION | Freq: Once | RESPIRATORY_TRACT | Status: DC

## 2018-01-06 NOTE — Progress Notes (Signed)
PCP: Raliegh IpNatalie Stroud, FNP Cardiologist: Dr. Harrold Donathcroitour  Pt. Reported he stopped eliquis and aspirin on 01/03/18. Stated no  One had told him to stop aspirin so he did. I informed susan, nurse for Dr. Laneta SimmersBartle that pt. Had stopped it. She stated it was okay.

## 2018-01-06 NOTE — Progress Notes (Addendum)
Pre-op Cardiac Surgery  Carotid Findings:  Bilateral ICA wnl.  Bilateral vertebral patent with poor antegrade flow.   Incidental finding: There is a hypoechoic lesion seen in Left Thyroid gland adjacent to Left CCA, measuring 0.63x0.87x0.88cm.  Upper Extremity Right Left  Brachial Pressures 113 115  Radial Waveforms Triphasic Triphasic  Ulnar Waveforms Triphasic Triphasic  Palmar Arch (Allen's Test) Radial compression wnl, Obliterate with Unlar compression.  Radial compression wnl, Obliterate with Unlar compression.     Lower  Extremity Right Left  Dorsalis Pedis Triphasic Triphasic  Anterior Tibial    Posterior Tibial Triphasic Triphasic  Ankle/Brachial Indices 1.33 1.37   Bilateral ABI's : Borderline to normal maybe due to some degree of stenosis causes slightly elevated pressure.   Hongying Richardson DoppCole (RDMS RVT) 01/06/18 12:04 PM

## 2018-01-06 NOTE — Progress Notes (Signed)
PCR +staph. Mupirocin [facility] per patient request. Left message for patient.

## 2018-01-07 MED ORDER — DEXMEDETOMIDINE HCL IN NACL 400 MCG/100ML IV SOLN
0.1000 ug/kg/h | INTRAVENOUS | Status: AC
  Administered 2018-01-08: .5 ug/kg/h via INTRAVENOUS
  Filled 2018-01-07: qty 100

## 2018-01-07 MED ORDER — KENNESTONE BLOOD CARDIOPLEGIA (KBC) MANNITOL SYRINGE (20%, 32ML)
32.0000 mL | Freq: Once | INTRAVENOUS | Status: DC
  Filled 2018-01-07: qty 32

## 2018-01-07 MED ORDER — VANCOMYCIN HCL 10 G IV SOLR
1500.0000 mg | INTRAVENOUS | Status: AC
  Administered 2018-01-08: 1500 mg via INTRAVENOUS
  Filled 2018-01-07: qty 1500

## 2018-01-07 MED ORDER — DOPAMINE-DEXTROSE 3.2-5 MG/ML-% IV SOLN
0.0000 ug/kg/min | INTRAVENOUS | Status: AC
  Administered 2018-01-08: 3 ug/kg/min via INTRAVENOUS
  Filled 2018-01-07: qty 250

## 2018-01-07 MED ORDER — SODIUM CHLORIDE 0.9 % IV SOLN
INTRAVENOUS | Status: DC
  Filled 2018-01-07: qty 30

## 2018-01-07 MED ORDER — KENNESTONE BLOOD CARDIOPLEGIA VIAL
13.0000 mL | Freq: Once | Status: DC
  Filled 2018-01-07: qty 13

## 2018-01-07 MED ORDER — SODIUM CHLORIDE 0.9 % IV SOLN
INTRAVENOUS | Status: AC
  Administered 2018-01-08: .8 [IU]/h via INTRAVENOUS
  Filled 2018-01-07: qty 1

## 2018-01-07 MED ORDER — MILRINONE LACTATE IN DEXTROSE 20-5 MG/100ML-% IV SOLN
0.1250 ug/kg/min | INTRAVENOUS | Status: AC
  Administered 2018-01-08: .2 ug/kg/min via INTRAVENOUS
  Filled 2018-01-07: qty 100

## 2018-01-07 MED ORDER — EPINEPHRINE PF 1 MG/ML IJ SOLN
0.0000 ug/min | INTRAVENOUS | Status: DC
  Filled 2018-01-07: qty 4

## 2018-01-07 MED ORDER — TRANEXAMIC ACID (OHS) BOLUS VIA INFUSION
15.0000 mg/kg | INTRAVENOUS | Status: AC
  Administered 2018-01-08: 1770 mg via INTRAVENOUS
  Filled 2018-01-07: qty 1770

## 2018-01-07 MED ORDER — SODIUM CHLORIDE 0.9 % IV SOLN
750.0000 mg | INTRAVENOUS | Status: DC
  Filled 2018-01-07: qty 750

## 2018-01-07 MED ORDER — POTASSIUM CHLORIDE 2 MEQ/ML IV SOLN
80.0000 meq | INTRAVENOUS | Status: DC
  Filled 2018-01-07: qty 40

## 2018-01-07 MED ORDER — SODIUM CHLORIDE 0.9 % IV SOLN
30.0000 ug/min | INTRAVENOUS | Status: AC
  Administered 2018-01-08: 40 ug/min via INTRAVENOUS
  Filled 2018-01-07: qty 2

## 2018-01-07 MED ORDER — MAGNESIUM SULFATE 50 % IJ SOLN
40.0000 meq | INTRAMUSCULAR | Status: DC
  Filled 2018-01-07: qty 9.85

## 2018-01-07 MED ORDER — TRANEXAMIC ACID 1000 MG/10ML IV SOLN
1.5000 mg/kg/h | INTRAVENOUS | Status: AC
  Administered 2018-01-08: 13:00:00 via INTRAVENOUS
  Administered 2018-01-08: 1.5 mg/kg/h via INTRAVENOUS
  Filled 2018-01-07: qty 25

## 2018-01-07 MED ORDER — TRANEXAMIC ACID (OHS) PUMP PRIME SOLUTION
2.0000 mg/kg | INTRAVENOUS | Status: DC
  Filled 2018-01-07: qty 2.36

## 2018-01-07 MED ORDER — PLASMA-LYTE 148 IV SOLN
INTRAVENOUS | Status: DC
  Filled 2018-01-07: qty 2.5

## 2018-01-07 MED ORDER — SODIUM CHLORIDE 0.9 % IV SOLN
1.5000 g | INTRAVENOUS | Status: AC
  Administered 2018-01-08: 1.5 g via INTRAVENOUS
  Filled 2018-01-07: qty 1.5

## 2018-01-07 MED ORDER — NITROGLYCERIN IN D5W 200-5 MCG/ML-% IV SOLN
2.0000 ug/min | INTRAVENOUS | Status: AC
  Administered 2018-01-08: 30 ug/min via INTRAVENOUS
  Filled 2018-01-07: qty 250

## 2018-01-07 MED FILL — MUPIROCIN 2% OINTMENT: 2 | 10 days supply | Qty: 22 | Fill #0

## 2018-01-07 NOTE — H&P (Signed)
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 Pressure 0  mmHg  RV EDP 5 mmHg  PA Systolic Pressure 27 mmHg  PA Diastolic Pressure 10 mmHg  PA Mean 17 mmHg  PW A Wave -99 mmHg  PW V Wave 12 mmHg  PW Mean 8 mmHg  AO Systolic Pressure 157 mmHg  AO Diastolic Pressure 76 mmHg  AO Mean 93 mmHg  LV Systolic Pressure 262 mmHg  LV Diastolic Pressure 7 mmHg  LV EDP 10 mmHg  AOp Systolic Pressure 035 mmHg  AOp Diastolic Pressure 72 mmHg  AOp Mean Pressure 93 mmHg  LVp Systolic Pressure 597 mmHg  LVp Diastolic Pressure 7 mmHg  LVp EDP Pressure 11 mmHg  QP/QS 1  TPVR Index 7.41 HRUI  TSVR Index 40.54 HRUI  PVR SVR Ratio 0.1  TPVR/TSVR Ratio  0.18    Assessment/Plan  This gentleman has a bicuspid aortic valve with moderate aortic insufficiency and a 5.4 cm fusiform ascending aortic aneurysm. He also has severe left ventricular systolic dysfunction with ejection fraction of 25 to 30% with diffuse hypokinesis with left ventricular dilation.  He has New York Heart Association class II symptoms of exertional fatigue and shortness of breath as well as occasional orthopnea consistent with chronic diastolic congestive heart failure.  I think the best option for this patient is replacement of his aortic valve and ascending aortic aneurysm to decrease the risk of aortic dissection and to prevent further left ventricular deterioration due to his aortic stenosis and regurgitation.  I reviewed the echocardiogram and cardiac catheterization findings again with him and the indications for surgery.  He would like to proceed.  We discussed the alternatives of mechanical and bioprosthetic valves.  He has had persistent atrial fibrillation and has been on Eliquis.  He has had prior strokes, one of which occurred last year while on Eliquis.  He does not want to be on Coumadin due to the required follow-up to monitor the adequacy of anticoagulation.  He travels a lot and does not want to have to watch his diet.  He would like to use a bioprosthetic valve and remain on Eliquis for his atrial fibrillation.  I think that is a reasonable alternative given his age,  left ventricular dysfunction, and aversion to Coumadin with the possibility that he may not be completely compliant.  I would also plan to place a clip on the left atrial appendage.  I discussed the operative procedure with the patient  including alternatives, benefits and risks; including but not limited to bleeding, blood transfusion, infection, stroke, myocardial infarction, graft failure, heart block requiring a permanent pacemaker, organ dysfunction, and death.  Juanito Doom understands and agrees to  proceed.     Plan:  He will be scheduled for Bentall procedure and replacement of the ascending aortic aneurysm using hypothermic circulatory arrest with a bioprosthetic aortic valve and clipping of the left atrial appendage on 01/08/2018.     Gaye Pollack, MD 01/07/2018, 3:56 PM

## 2018-01-08 ENCOUNTER — Inpatient Hospital Stay (HOSPITAL_COMMUNITY): Payer: Self-pay | Admitting: Anesthesiology

## 2018-01-08 ENCOUNTER — Ambulatory Visit (HOSPITAL_COMMUNITY): Payer: Self-pay

## 2018-01-08 ENCOUNTER — Inpatient Hospital Stay (HOSPITAL_COMMUNITY)
Admission: RE | Admit: 2018-01-08 | Discharge: 2018-01-18 | DRG: 220 | Disposition: A | Discharge status: Home / Self Care | Payer: Self-pay | Attending: Surgery | Admitting: Surgery

## 2018-01-08 ENCOUNTER — Inpatient Hospital Stay (HOSPITAL_COMMUNITY): Payer: Self-pay

## 2018-01-08 ENCOUNTER — Encounter (HOSPITAL_COMMUNITY): Payer: Self-pay | Admitting: *Deleted

## 2018-01-08 ENCOUNTER — Inpatient Hospital Stay (HOSPITAL_COMMUNITY)
Admission: RE | Disposition: A | Discharge status: Home / Self Care | Payer: Self-pay | Source: Home / Self Care | Attending: Surgery

## 2018-01-08 DIAGNOSIS — Z09 Encounter for follow-up examination after completed treatment for conditions other than malignant neoplasm: Secondary | ICD-10-CM

## 2018-01-08 DIAGNOSIS — I712 Thoracic aortic aneurysm, without rupture: Principal | ICD-10-CM | POA: Diagnosis present

## 2018-01-08 DIAGNOSIS — I350 Nonrheumatic aortic (valve) stenosis: Secondary | ICD-10-CM

## 2018-01-08 DIAGNOSIS — I110 Hypertensive heart disease with heart failure: Secondary | ICD-10-CM | POA: Diagnosis present

## 2018-01-08 DIAGNOSIS — I481 Persistent atrial fibrillation: Secondary | ICD-10-CM | POA: Diagnosis present

## 2018-01-08 DIAGNOSIS — Z952 Presence of prosthetic heart valve: Secondary | ICD-10-CM

## 2018-01-08 DIAGNOSIS — D62 Acute posthemorrhagic anemia: Secondary | ICD-10-CM | POA: Diagnosis not present

## 2018-01-08 DIAGNOSIS — I371 Nonrheumatic pulmonary valve insufficiency: Secondary | ICD-10-CM | POA: Diagnosis present

## 2018-01-08 DIAGNOSIS — I950 Idiopathic hypotension: Secondary | ICD-10-CM | POA: Diagnosis present

## 2018-01-08 DIAGNOSIS — I083 Combined rheumatic disorders of mitral, aortic and tricuspid valves: Secondary | ICD-10-CM | POA: Diagnosis present

## 2018-01-08 DIAGNOSIS — I5042 Chronic combined systolic (congestive) and diastolic (congestive) heart failure: Secondary | ICD-10-CM | POA: Diagnosis present

## 2018-01-08 DIAGNOSIS — Z8249 Family history of ischemic heart disease and other diseases of the circulatory system: Secondary | ICD-10-CM

## 2018-01-08 DIAGNOSIS — I959 Hypotension, unspecified: Secondary | ICD-10-CM

## 2018-01-08 DIAGNOSIS — Z8679 Personal history of other diseases of the circulatory system: Secondary | ICD-10-CM

## 2018-01-08 DIAGNOSIS — Z8673 Personal history of transient ischemic attack (TIA), and cerebral infarction without residual deficits: Secondary | ICD-10-CM

## 2018-01-08 DIAGNOSIS — I2510 Atherosclerotic heart disease of native coronary artery without angina pectoris: Secondary | ICD-10-CM | POA: Diagnosis present

## 2018-01-08 DIAGNOSIS — I420 Dilated cardiomyopathy: Secondary | ICD-10-CM | POA: Diagnosis present

## 2018-01-08 DIAGNOSIS — R740 Nonspecific elevation of levels of transaminase and lactic acid dehydrogenase [LDH]: Secondary | ICD-10-CM | POA: Diagnosis not present

## 2018-01-08 DIAGNOSIS — I4891 Unspecified atrial fibrillation: Secondary | ICD-10-CM

## 2018-01-08 DIAGNOSIS — I351 Nonrheumatic aortic (valve) insufficiency: Secondary | ICD-10-CM

## 2018-01-08 DIAGNOSIS — I7121 Aneurysm of the ascending aorta, without rupture: Secondary | ICD-10-CM

## 2018-01-08 DIAGNOSIS — I639 Cerebral infarction, unspecified: Secondary | ICD-10-CM

## 2018-01-08 DIAGNOSIS — Q231 Congenital insufficiency of aortic valve: Secondary | ICD-10-CM

## 2018-01-08 DIAGNOSIS — I313 Pericardial effusion (noninflammatory): Secondary | ICD-10-CM | POA: Diagnosis present

## 2018-01-08 DIAGNOSIS — Z7901 Long term (current) use of anticoagulants: Secondary | ICD-10-CM

## 2018-01-08 DIAGNOSIS — Z9889 Other specified postprocedural states: Secondary | ICD-10-CM

## 2018-01-08 HISTORY — PX: TEE WITHOUT CARDIOVERSION: SHX5443

## 2018-01-08 HISTORY — PX: ASCENDING AORTIC ROOT REPLACEMENT: SHX5729

## 2018-01-08 HISTORY — PX: BENTALL PROCEDURE: SHX5058

## 2018-01-08 HISTORY — PX: CLIPPING OF ATRIAL APPENDAGE: SHX5773

## 2018-01-08 LAB — POCT I-STAT, CHEM 8
BUN: 17 mg/dL (ref 6–20)
BUN: 14 mg/dL (ref 6–20)
BUN: 16 mg/dL (ref 6–20)
BUN: 15 mg/dL (ref 6–20)
BUN: 15 mg/dL (ref 6–20)
BUN: 15 mg/dL (ref 6–20)
BUN: 14 mg/dL (ref 6–20)
BUN: 14 mg/dL (ref 6–20)
BUN: 14 mg/dL (ref 6–20)
CALCIUM ION: 1.27 mmol/L (ref 1.15–1.40)
CALCIUM ION: 1 mmol/L — AB (ref 1.15–1.40)
CALCIUM ION: 1.23 mmol/L (ref 1.15–1.40)
CALCIUM ION: 0.99 mmol/L — AB (ref 1.15–1.40)
CALCIUM ION: 1.05 mmol/L — AB (ref 1.15–1.40)
CALCIUM ION: 1.04 mmol/L — AB (ref 1.15–1.40)
CALCIUM ION: 1.09 mmol/L — AB (ref 1.15–1.40)
CALCIUM ION: 1.13 mmol/L — AB (ref 1.15–1.40)
CHLORIDE: 103 mmol/L (ref 98–111)
CHLORIDE: 101 mmol/L (ref 98–111)
CHLORIDE: 102 mmol/L (ref 98–111)
CHLORIDE: 102 mmol/L (ref 98–111)
CHLORIDE: 104 mmol/L (ref 98–111)
CREATININE: 0.8 mg/dL (ref 0.61–1.24)
CREATININE: 0.9 mg/dL (ref 0.61–1.24)
CREATININE: 0.9 mg/dL (ref 0.61–1.24)
CREATININE: 1 mg/dL (ref 0.61–1.24)
Calcium, Ion: 1.02 mmol/L — ABNORMAL LOW (ref 1.15–1.40)
Chloride: 105 mmol/L (ref 98–111)
Chloride: 99 mmol/L (ref 98–111)
Chloride: 105 mmol/L (ref 98–111)
Chloride: 108 mmol/L (ref 98–111)
Creatinine, Ser: 1 mg/dL (ref 0.61–1.24)
Creatinine, Ser: 0.8 mg/dL (ref 0.61–1.24)
Creatinine, Ser: 1.1 mg/dL (ref 0.61–1.24)
Creatinine, Ser: 0.9 mg/dL (ref 0.61–1.24)
Creatinine, Ser: 0.9 mg/dL (ref 0.61–1.24)
GLUCOSE: 100 mg/dL — AB (ref 70–99)
GLUCOSE: 104 mg/dL — AB (ref 70–99)
GLUCOSE: 140 mg/dL — AB (ref 70–99)
Glucose, Bld: 99 mg/dL (ref 70–99)
Glucose, Bld: 133 mg/dL — ABNORMAL HIGH (ref 70–99)
Glucose, Bld: 172 mg/dL — ABNORMAL HIGH (ref 70–99)
Glucose, Bld: 160 mg/dL — ABNORMAL HIGH (ref 70–99)
Glucose, Bld: 136 mg/dL — ABNORMAL HIGH (ref 70–99)
Glucose, Bld: 132 mg/dL — ABNORMAL HIGH (ref 70–99)
HCT: 42 % (ref 39.0–52.0)
HCT: 41 % (ref 39.0–52.0)
HCT: 33 % — ABNORMAL LOW (ref 39.0–52.0)
HCT: 33 % — ABNORMAL LOW (ref 39.0–52.0)
HCT: 33 % — ABNORMAL LOW (ref 39.0–52.0)
HCT: 38 % — ABNORMAL LOW (ref 39.0–52.0)
HCT: 32 % — ABNORMAL LOW (ref 39.0–52.0)
HEMATOCRIT: 35 % — AB (ref 39.0–52.0)
HEMATOCRIT: 33 % — AB (ref 39.0–52.0)
HEMOGLOBIN: 13.9 g/dL (ref 13.0–17.0)
HEMOGLOBIN: 11.2 g/dL — AB (ref 13.0–17.0)
HEMOGLOBIN: 10.9 g/dL — AB (ref 13.0–17.0)
Hemoglobin: 14.3 g/dL (ref 13.0–17.0)
Hemoglobin: 11.9 g/dL — ABNORMAL LOW (ref 13.0–17.0)
Hemoglobin: 11.2 g/dL — ABNORMAL LOW (ref 13.0–17.0)
Hemoglobin: 11.2 g/dL — ABNORMAL LOW (ref 13.0–17.0)
Hemoglobin: 11.2 g/dL — ABNORMAL LOW (ref 13.0–17.0)
Hemoglobin: 12.9 g/dL — ABNORMAL LOW (ref 13.0–17.0)
POTASSIUM: 4.1 mmol/L (ref 3.5–5.1)
POTASSIUM: 4.3 mmol/L (ref 3.5–5.1)
POTASSIUM: 4.5 mmol/L (ref 3.5–5.1)
Potassium: 4.1 mmol/L (ref 3.5–5.1)
Potassium: 4 mmol/L (ref 3.5–5.1)
Potassium: 5 mmol/L (ref 3.5–5.1)
Potassium: 4.1 mmol/L (ref 3.5–5.1)
Potassium: 4.5 mmol/L (ref 3.5–5.1)
Potassium: 4.1 mmol/L (ref 3.5–5.1)
SODIUM: 141 mmol/L (ref 135–145)
SODIUM: 140 mmol/L (ref 135–145)
SODIUM: 134 mmol/L — AB (ref 135–145)
SODIUM: 142 mmol/L (ref 135–145)
SODIUM: 142 mmol/L (ref 135–145)
SODIUM: 140 mmol/L (ref 135–145)
Sodium: 140 mmol/L (ref 135–145)
Sodium: 136 mmol/L (ref 135–145)
Sodium: 140 mmol/L (ref 135–145)
TCO2: 26 mmol/L (ref 22–32)
TCO2: 23 mmol/L (ref 22–32)
TCO2: 25 mmol/L (ref 22–32)
TCO2: 26 mmol/L (ref 22–32)
TCO2: 27 mmol/L (ref 22–32)
TCO2: 23 mmol/L (ref 22–32)
TCO2: 23 mmol/L (ref 22–32)
TCO2: 23 mmol/L (ref 22–32)
TCO2: 24 mmol/L (ref 22–32)

## 2018-01-08 LAB — POCT I-STAT 3, ART BLOOD GAS (G3+)
ACID-BASE DEFICIT: 1 mmol/L (ref 0.0–2.0)
ACID-BASE DEFICIT: 5 mmol/L — AB (ref 0.0–2.0)
ACID-BASE DEFICIT: 2 mmol/L (ref 0.0–2.0)
Acid-base deficit: 1 mmol/L (ref 0.0–2.0)
Acid-base deficit: 1 mmol/L (ref 0.0–2.0)
Acid-base deficit: 2 mmol/L (ref 0.0–2.0)
Acid-base deficit: 2 mmol/L (ref 0.0–2.0)
BICARBONATE: 24.7 mmol/L (ref 20.0–28.0)
BICARBONATE: 20.5 mmol/L (ref 20.0–28.0)
Bicarbonate: 25.8 mmol/L (ref 20.0–28.0)
Bicarbonate: 25.7 mmol/L (ref 20.0–28.0)
Bicarbonate: 24.6 mmol/L (ref 20.0–28.0)
Bicarbonate: 23.5 mmol/L (ref 20.0–28.0)
Bicarbonate: 23.5 mmol/L (ref 20.0–28.0)
Bicarbonate: 26.2 mmol/L (ref 20.0–28.0)
O2 SAT: 98 %
O2 SAT: 99 %
O2 SAT: 99 %
O2 Saturation: 100 %
O2 Saturation: 100 %
O2 Saturation: 100 %
O2 Saturation: 97 %
O2 Saturation: 98 %
PCO2 ART: 48.4 mmHg — AB (ref 32.0–48.0)
PCO2 ART: 47.1 mmHg (ref 32.0–48.0)
PH ART: 7.335 — AB (ref 7.350–7.450)
PH ART: 7.336 — AB (ref 7.350–7.450)
PH ART: 7.327 — AB (ref 7.350–7.450)
PH ART: 7.349 — AB (ref 7.350–7.450)
PO2 ART: 386 mmHg — AB (ref 83.0–108.0)
PO2 ART: 381 mmHg — AB (ref 83.0–108.0)
PO2 ART: 155 mmHg — AB (ref 83.0–108.0)
Patient temperature: 37.3
Patient temperature: 37.9
TCO2: 27 mmol/L (ref 22–32)
TCO2: 27 mmol/L (ref 22–32)
TCO2: 26 mmol/L (ref 22–32)
TCO2: 26 mmol/L (ref 22–32)
TCO2: 22 mmol/L (ref 22–32)
TCO2: 25 mmol/L (ref 22–32)
TCO2: 25 mmol/L (ref 22–32)
TCO2: 28 mmol/L (ref 22–32)
pCO2 arterial: 48 mmHg (ref 32.0–48.0)
pCO2 arterial: 45.1 mmHg (ref 32.0–48.0)
pCO2 arterial: 38.7 mmHg (ref 32.0–48.0)
pCO2 arterial: 43.1 mmHg (ref 32.0–48.0)
pCO2 arterial: 48.5 mmHg — ABNORMAL HIGH (ref 32.0–48.0)
pCO2 arterial: 50.7 mmHg — ABNORMAL HIGH (ref 32.0–48.0)
pH, Arterial: 7.344 — ABNORMAL LOW (ref 7.350–7.450)
pH, Arterial: 7.334 — ABNORMAL LOW (ref 7.350–7.450)
pH, Arterial: 7.293 — ABNORMAL LOW (ref 7.350–7.450)
pH, Arterial: 7.344 — ABNORMAL LOW (ref 7.350–7.450)
pO2, Arterial: 338 mmHg — ABNORMAL HIGH (ref 83.0–108.0)
pO2, Arterial: 120 mmHg — ABNORMAL HIGH (ref 83.0–108.0)
pO2, Arterial: 94 mmHg (ref 83.0–108.0)
pO2, Arterial: 121 mmHg — ABNORMAL HIGH (ref 83.0–108.0)
pO2, Arterial: 167 mmHg — ABNORMAL HIGH (ref 83.0–108.0)

## 2018-01-08 LAB — CBC
HCT: 39 % (ref 39.0–52.0)
HEMATOCRIT: 35.5 % — AB (ref 39.0–52.0)
Hemoglobin: 12.8 g/dL — ABNORMAL LOW (ref 13.0–17.0)
Hemoglobin: 11.7 g/dL — ABNORMAL LOW (ref 13.0–17.0)
MCH: 28.6 pg (ref 26.0–34.0)
MCH: 28.8 pg (ref 26.0–34.0)
MCHC: 32.8 g/dL (ref 30.0–36.0)
MCHC: 33 g/dL (ref 30.0–36.0)
MCV: 87.2 fL (ref 78.0–100.0)
MCV: 87.4 fL (ref 78.0–100.0)
Platelets: 145 10*3/uL — ABNORMAL LOW (ref 150–400)
Platelets: 141 10*3/uL — ABNORMAL LOW (ref 150–400)
RBC: 4.47 MIL/uL (ref 4.22–5.81)
RBC: 4.06 MIL/uL — ABNORMAL LOW (ref 4.22–5.81)
RDW: 13 % (ref 11.5–15.5)
RDW: 13.2 % (ref 11.5–15.5)
WBC: 10.6 10*3/uL — AB (ref 4.0–10.5)
WBC: 10.3 10*3/uL (ref 4.0–10.5)

## 2018-01-08 LAB — POCT I-STAT 7, (LYTES, BLD GAS, ICA,H+H)
BICARBONATE: 24 mmol/L (ref 20.0–28.0)
CALCIUM ION: 1.23 mmol/L (ref 1.15–1.40)
HEMATOCRIT: 41 % (ref 39.0–52.0)
Hemoglobin: 13.9 g/dL (ref 13.0–17.0)
O2 Saturation: 98 %
POTASSIUM: 4 mmol/L (ref 3.5–5.1)
SODIUM: 141 mmol/L (ref 135–145)
TCO2: 25 mmol/L (ref 22–32)
pCO2 arterial: 37.6 mmHg (ref 32.0–48.0)
pH, Arterial: 7.412 (ref 7.350–7.450)
pO2, Arterial: 113 mmHg — ABNORMAL HIGH (ref 83.0–108.0)

## 2018-01-08 LAB — GLUCOSE, CAPILLARY
GLUCOSE-CAPILLARY: 139 mg/dL — AB (ref 70–99)
Glucose-Capillary: 137 mg/dL — ABNORMAL HIGH (ref 70–99)
Glucose-Capillary: 130 mg/dL — ABNORMAL HIGH (ref 70–99)
Glucose-Capillary: 150 mg/dL — ABNORMAL HIGH (ref 70–99)
Glucose-Capillary: 140 mg/dL — ABNORMAL HIGH (ref 70–99)
Glucose-Capillary: 132 mg/dL — ABNORMAL HIGH (ref 70–99)
Glucose-Capillary: 137 mg/dL — ABNORMAL HIGH (ref 70–99)

## 2018-01-08 LAB — PROTIME-INR
INR: 2.46
INR: 1.39
PROTHROMBIN TIME: 26.4 s — AB (ref 11.4–15.2)
Prothrombin Time: 16.9 seconds — ABNORMAL HIGH (ref 11.4–15.2)

## 2018-01-08 LAB — POCT ACTIVATED CLOTTING TIME
ACTIVATED CLOTTING TIME: 115 s
ACTIVATED CLOTTING TIME: 565 s
Activated Clotting Time: 437 seconds

## 2018-01-08 LAB — CREATININE, SERUM
Creatinine, Ser: 1.03 mg/dL (ref 0.61–1.24)
GFR calc non Af Amer: >60 mL/min (ref >60)

## 2018-01-08 LAB — HEMOGLOBIN AND HEMATOCRIT, BLOOD
HCT: 36 % — ABNORMAL LOW (ref 39.0–52.0)
HEMOGLOBIN: 12.1 g/dL — AB (ref 13.0–17.0)

## 2018-01-08 LAB — FIBRINOGEN: Fibrinogen: 196 mg/dL — ABNORMAL LOW (ref 210–475)

## 2018-01-08 LAB — MAGNESIUM: Magnesium: 2.8 mg/dL — ABNORMAL HIGH (ref 1.7–2.4)

## 2018-01-08 LAB — PLATELET COUNT: Platelets: 126 10*3/uL — ABNORMAL LOW (ref 150–400)

## 2018-01-08 SURGERY — BENTALL PROCEDURE
Anesthesia: General | Site: Chest

## 2018-01-08 MED ORDER — SODIUM CHLORIDE 0.9 % IV SOLN
INTRAVENOUS | Status: DC
  Filled 2018-01-08 (×2): qty 1

## 2018-01-08 MED ORDER — MIDAZOLAM HCL 5 MG/5ML IJ SOLN
INTRAMUSCULAR | Status: DC | PRN
  Administered 2018-01-08 (×7): 2 mg via INTRAVENOUS

## 2018-01-08 MED ORDER — SODIUM CHLORIDE 0.9% FLUSH
3.0000 mL | Freq: Two times a day (BID) | INTRAVENOUS | Status: DC
  Administered 2018-01-09 – 2018-01-11 (×5): 3 mL via INTRAVENOUS

## 2018-01-08 MED ORDER — SODIUM CHLORIDE 0.9 % IV SOLN: 250.0000 mL | INTRAVENOUS | Status: DC

## 2018-01-08 MED ORDER — PHENYLEPHRINE 40 MCG/ML (10ML) SYRINGE FOR IV PUSH (FOR BLOOD PRESSURE SUPPORT)
PREFILLED_SYRINGE | INTRAVENOUS | Status: AC
  Filled 2018-01-08: qty 10

## 2018-01-08 MED ORDER — CHLORHEXIDINE GLUCONATE 0.12% ORAL RINSE (MEDLINE KIT): 15.0000 mL | Freq: Two times a day (BID) | OROMUCOSAL | Status: DC

## 2018-01-08 MED ORDER — ACETAMINOPHEN 160 MG/5ML PO SOLN: 650.0000 mg | Freq: Once | ORAL | Status: AC

## 2018-01-08 MED ORDER — TRAMADOL HCL 50 MG PO TABS: 50.0000 mg | ORAL_TABLET | ORAL | Status: DC | PRN

## 2018-01-08 MED ORDER — ASPIRIN EC 325 MG PO TBEC
325.0000 mg | DELAYED_RELEASE_TABLET | Freq: Every day | ORAL | Status: DC
  Administered 2018-01-09 – 2018-01-11 (×3): 325 mg via ORAL
  Filled 2018-01-08 (×3): qty 1

## 2018-01-08 MED ORDER — METHYLPREDNISOLONE SODIUM SUCC 125 MG IJ SOLR
INTRAMUSCULAR | Status: DC | PRN
  Administered 2018-01-08: 125 mg via INTRAVENOUS

## 2018-01-08 MED ORDER — LACTATED RINGERS IV SOLN: INTRAVENOUS | Status: DC

## 2018-01-08 MED ORDER — LACTATED RINGERS IV SOLN
INTRAVENOUS | Status: DC | PRN
  Administered 2018-01-08: 07:00:00 via INTRAVENOUS

## 2018-01-08 MED ORDER — MILRINONE LACTATE IN DEXTROSE 20-5 MG/100ML-% IV SOLN
0.2000 ug/kg/min | INTRAVENOUS | Status: DC
  Administered 2018-01-08: 0.2 ug/kg/min via INTRAVENOUS
  Filled 2018-01-08: qty 100

## 2018-01-08 MED ORDER — SODIUM CHLORIDE 0.9 % IV SOLN
INTRAVENOUS | Status: DC
  Administered 2018-01-08: 15:00:00 via INTRAVENOUS

## 2018-01-08 MED ORDER — PROTAMINE SULFATE 10 MG/ML IV SOLN
INTRAVENOUS | Status: AC
  Filled 2018-01-08: qty 5

## 2018-01-08 MED ORDER — MORPHINE SULFATE (PF) 2 MG/ML IV SOLN: 1.0000 mg | INTRAVENOUS | Status: AC | PRN

## 2018-01-08 MED ORDER — ACETAMINOPHEN 500 MG PO TABS
1000.0000 mg | ORAL_TABLET | Freq: Four times a day (QID) | ORAL | Status: DC
  Administered 2018-01-08 – 2018-01-11 (×12): 1000 mg via ORAL
  Filled 2018-01-08 (×12): qty 2

## 2018-01-08 MED ORDER — LACTATED RINGERS IV SOLN
INTRAVENOUS | Status: DC | PRN
  Administered 2018-01-08 (×2): via INTRAVENOUS

## 2018-01-08 MED ORDER — MIDAZOLAM HCL 10 MG/2ML IJ SOLN
INTRAMUSCULAR | Status: AC
  Filled 2018-01-08: qty 2

## 2018-01-08 MED ORDER — METOPROLOL TARTRATE 25 MG/10 ML ORAL SUSPENSION: 12.5000 mg | Freq: Two times a day (BID) | ORAL | Status: DC

## 2018-01-08 MED ORDER — METOPROLOL TARTRATE 5 MG/5ML IV SOLN
2.5000 mg | INTRAVENOUS | Status: DC | PRN
  Administered 2018-01-09 – 2018-01-10 (×2): 2.5 mg via INTRAVENOUS
  Filled 2018-01-08 (×2): qty 5

## 2018-01-08 MED ORDER — THROMBIN 5000 UNITS EX SOLR
CUTANEOUS | Status: AC
  Filled 2018-01-08: qty 5000

## 2018-01-08 MED ORDER — CHLORHEXIDINE GLUCONATE 0.12 % MT SOLN
15.0000 mL | Freq: Once | OROMUCOSAL | Status: AC
  Administered 2018-01-08: 15 mL via OROMUCOSAL
  Filled 2018-01-08: qty 15

## 2018-01-08 MED ORDER — TRANEXAMIC ACID 1000 MG/10ML IV SOLN
1.5000 mg/kg/h | INTRAVENOUS | Status: DC
  Administered 2018-01-08: 177 mg/h via INTRAVENOUS
  Filled 2018-01-08: qty 10

## 2018-01-08 MED ORDER — DOCUSATE SODIUM 100 MG PO CAPS
200.0000 mg | ORAL_CAPSULE | Freq: Every day | ORAL | Status: DC
  Administered 2018-01-09 – 2018-01-11 (×3): 200 mg via ORAL
  Filled 2018-01-08 (×3): qty 2

## 2018-01-08 MED ORDER — SODIUM CHLORIDE 0.45 % IV SOLN
INTRAVENOUS | Status: DC | PRN
  Administered 2018-01-08: 16:00:00 via INTRAVENOUS

## 2018-01-08 MED ORDER — MIDAZOLAM HCL 2 MG/2ML IJ SOLN
2.0000 mg | INTRAMUSCULAR | Status: DC | PRN
  Administered 2018-01-08: 2 mg via INTRAVENOUS
  Filled 2018-01-08: qty 2

## 2018-01-08 MED ORDER — ALBUMIN HUMAN 5 % IV SOLN
250.0000 mL | INTRAVENOUS | Status: DC | PRN
  Administered 2018-01-08 (×3): 250 mL via INTRAVENOUS
  Filled 2018-01-08 (×2): qty 250

## 2018-01-08 MED ORDER — LIDOCAINE 2% (20 MG/ML) 5 ML SYRINGE
INTRAMUSCULAR | Status: DC | PRN
  Administered 2018-01-08: 100 mg via INTRAVENOUS

## 2018-01-08 MED ORDER — MIDAZOLAM HCL 2 MG/2ML IJ SOLN
INTRAMUSCULAR | Status: AC
  Filled 2018-01-08: qty 4

## 2018-01-08 MED ORDER — ROCURONIUM BROMIDE 10 MG/ML (PF) SYRINGE
PREFILLED_SYRINGE | INTRAVENOUS | Status: AC
  Filled 2018-01-08: qty 20

## 2018-01-08 MED ORDER — NITROGLYCERIN IN D5W 200-5 MCG/ML-% IV SOLN: 0.0000 ug/min | INTRAVENOUS | Status: DC

## 2018-01-08 MED ORDER — LACTATED RINGERS IV SOLN: 500.0000 mL | Freq: Once | INTRAVENOUS | Status: DC | PRN

## 2018-01-08 MED ORDER — METOPROLOL TARTRATE 12.5 MG HALF TABLET: 12.5000 mg | ORAL_TABLET | Freq: Two times a day (BID) | ORAL | Status: DC

## 2018-01-08 MED ORDER — HEPARIN SODIUM (PORCINE) 1000 UNIT/ML IJ SOLN
INTRAMUSCULAR | Status: AC
  Filled 2018-01-08: qty 2

## 2018-01-08 MED ORDER — FENTANYL CITRATE (PF) 250 MCG/5ML IJ SOLN
INTRAMUSCULAR | Status: DC | PRN
  Administered 2018-01-08: 250 ug via INTRAVENOUS
  Administered 2018-01-08: 150 ug via INTRAVENOUS
  Administered 2018-01-08: 50 ug via INTRAVENOUS
  Administered 2018-01-08: 200 ug via INTRAVENOUS
  Administered 2018-01-08: 250 ug via INTRAVENOUS

## 2018-01-08 MED ORDER — HEMOSTATIC AGENTS (NO CHARGE) OPTIME
TOPICAL | Status: DC | PRN
  Administered 2018-01-08 (×6): 1 via TOPICAL

## 2018-01-08 MED ORDER — LIDOCAINE 2% (20 MG/ML) 5 ML SYRINGE
INTRAMUSCULAR | Status: AC
  Filled 2018-01-08: qty 5

## 2018-01-08 MED ORDER — MORPHINE SULFATE (PF) 2 MG/ML IV SOLN
2.0000 mg | INTRAVENOUS | Status: DC | PRN
  Administered 2018-01-08: 4 mg via INTRAVENOUS
  Administered 2018-01-08 – 2018-01-09 (×3): 2 mg via INTRAVENOUS
  Administered 2018-01-09 (×3): 4 mg via INTRAVENOUS
  Filled 2018-01-08: qty 1
  Filled 2018-01-08: qty 2
  Filled 2018-01-08: qty 1
  Filled 2018-01-08: qty 2
  Filled 2018-01-08: qty 1
  Filled 2018-01-08 (×2): qty 2

## 2018-01-08 MED ORDER — ASPIRIN 81 MG PO CHEW: 324.0000 mg | CHEWABLE_TABLET | Freq: Every day | ORAL | Status: DC

## 2018-01-08 MED ORDER — CHLORHEXIDINE GLUCONATE 0.12 % MT SOLN
15.0000 mL | OROMUCOSAL | Status: AC
  Administered 2018-01-08: 15 mL via OROMUCOSAL

## 2018-01-08 MED ORDER — PROPOFOL 10 MG/ML IV BOLUS
INTRAVENOUS | Status: AC
  Filled 2018-01-08: qty 20

## 2018-01-08 MED ORDER — LACTATED RINGERS IV SOLN
INTRAVENOUS | Status: DC
  Administered 2018-01-08: 15:00:00 via INTRAVENOUS

## 2018-01-08 MED ORDER — DEXMEDETOMIDINE HCL IN NACL 400 MCG/100ML IV SOLN
0.0000 ug/kg/h | INTRAVENOUS | Status: DC
  Administered 2018-01-08: 0.7 ug/kg/h via INTRAVENOUS
  Filled 2018-01-08: qty 100

## 2018-01-08 MED ORDER — PROTAMINE SULFATE 10 MG/ML IV SOLN
INTRAVENOUS | Status: AC
Start: 2018-01-08 — End: ?
  Filled 2018-01-08: qty 50

## 2018-01-08 MED ORDER — OXYCODONE HCL 5 MG PO TABS
5.0000 mg | ORAL_TABLET | ORAL | Status: DC | PRN
  Administered 2018-01-09: 5 mg via ORAL
  Administered 2018-01-09 – 2018-01-10 (×3): 10 mg via ORAL
  Administered 2018-01-11: 5 mg via ORAL
  Filled 2018-01-08: qty 2
  Filled 2018-01-08: qty 1
  Filled 2018-01-08 (×2): qty 2
  Filled 2018-01-08: qty 1
  Filled 2018-01-08: qty 2

## 2018-01-08 MED ORDER — SODIUM CHLORIDE 0.9 % IV SOLN
1.5000 g | Freq: Two times a day (BID) | INTRAVENOUS | Status: AC
  Administered 2018-01-08 – 2018-01-10 (×4): 1.5 g via INTRAVENOUS
  Filled 2018-01-08 (×4): qty 1.5

## 2018-01-08 MED ORDER — DEXMEDETOMIDINE HCL IN NACL 200 MCG/50ML IV SOLN
INTRAVENOUS | Status: AC
  Filled 2018-01-08: qty 50

## 2018-01-08 MED ORDER — POTASSIUM CHLORIDE 10 MEQ/50ML IV SOLN: 10.0000 meq | INTRAVENOUS | Status: AC

## 2018-01-08 MED ORDER — THROMBIN 5000 UNITS EX SOLR
CUTANEOUS | Status: DC | PRN
  Administered 2018-01-08: 5000 [IU] via TOPICAL

## 2018-01-08 MED ORDER — PHENYLEPHRINE 40 MCG/ML (10ML) SYRINGE FOR IV PUSH (FOR BLOOD PRESSURE SUPPORT)
PREFILLED_SYRINGE | INTRAVENOUS | Status: DC | PRN
  Administered 2018-01-08: 40 ug via INTRAVENOUS
  Administered 2018-01-08: 80 ug via INTRAVENOUS
  Administered 2018-01-08 (×4): 40 ug via INTRAVENOUS
  Administered 2018-01-08: 120 ug via INTRAVENOUS
  Administered 2018-01-08 (×3): 40 ug via INTRAVENOUS

## 2018-01-08 MED ORDER — CHLORHEXIDINE GLUCONATE 4 % EX LIQD: 30.0000 mL | CUTANEOUS | Status: DC

## 2018-01-08 MED ORDER — SODIUM CHLORIDE 0.9 % IV SOLN
INTRAVENOUS | Status: DC | PRN
  Administered 2018-01-08: 750 mg via INTRAVENOUS

## 2018-01-08 MED ORDER — ORAL CARE MOUTH RINSE
15.0000 mL | Freq: Two times a day (BID) | OROMUCOSAL | Status: DC
  Administered 2018-01-08 – 2018-01-17 (×10): 15 mL via OROMUCOSAL

## 2018-01-08 MED ORDER — HEPARIN SODIUM (PORCINE) 1000 UNIT/ML IJ SOLN
INTRAMUSCULAR | Status: DC | PRN
  Administered 2018-01-08 (×2): 41000 [IU] via INTRAVENOUS

## 2018-01-08 MED ORDER — PROTAMINE SULFATE 10 MG/ML IV SOLN
INTRAVENOUS | Status: DC | PRN
  Administered 2018-01-08: 240 mg via INTRAVENOUS
  Administered 2018-01-08: 30 mg via INTRAVENOUS
  Administered 2018-01-08 (×2): 10 mg via INTRAVENOUS

## 2018-01-08 MED ORDER — PHENYLEPHRINE HCL-NACL 20-0.9 MG/250ML-% IV SOLN
0.0000 ug/min | INTRAVENOUS | Status: DC
  Administered 2018-01-08: 70 ug/min via INTRAVENOUS
  Administered 2018-01-09: 40 ug/min via INTRAVENOUS
  Filled 2018-01-08 (×4): qty 250

## 2018-01-08 MED ORDER — FAMOTIDINE IN NACL 20-0.9 MG/50ML-% IV SOLN
20.0000 mg | Freq: Two times a day (BID) | INTRAVENOUS | Status: DC
  Administered 2018-01-08: 20 mg via INTRAVENOUS

## 2018-01-08 MED ORDER — FENTANYL CITRATE (PF) 250 MCG/5ML IJ SOLN
INTRAMUSCULAR | Status: AC
  Filled 2018-01-08: qty 25

## 2018-01-08 MED ORDER — DEXMEDETOMIDINE HCL IN NACL 200 MCG/50ML IV SOLN
0.0000 ug/kg/h | INTRAVENOUS | Status: DC
  Filled 2018-01-08: qty 50

## 2018-01-08 MED ORDER — BISACODYL 10 MG RE SUPP: 10.0000 mg | Freq: Every day | RECTAL | Status: DC

## 2018-01-08 MED ORDER — ACETAMINOPHEN 160 MG/5ML PO SOLN: 1000.0000 mg | Freq: Four times a day (QID) | ORAL | Status: DC

## 2018-01-08 MED ORDER — ROCURONIUM BROMIDE 10 MG/ML (PF) SYRINGE
PREFILLED_SYRINGE | INTRAVENOUS | Status: DC | PRN
  Administered 2018-01-08: 100 mg via INTRAVENOUS
  Administered 2018-01-08 (×3): 50 mg via INTRAVENOUS
  Administered 2018-01-08: 100 mg via INTRAVENOUS
  Administered 2018-01-08: 50 mg via INTRAVENOUS

## 2018-01-08 MED ORDER — ACETAMINOPHEN 650 MG RE SUPP
650.0000 mg | Freq: Once | RECTAL | Status: AC
  Administered 2018-01-08: 650 mg via RECTAL

## 2018-01-08 MED ORDER — SODIUM BICARBONATE 8.4 % IV SOLN
50.0000 meq | Freq: Once | INTRAVENOUS | Status: AC
  Administered 2018-01-08: 50 meq via INTRAVENOUS

## 2018-01-08 MED ORDER — INSULIN REGULAR BOLUS VIA INFUSION
0.0000 [IU] | Freq: Three times a day (TID) | INTRAVENOUS | Status: DC
  Filled 2018-01-08: qty 10

## 2018-01-08 MED ORDER — MAGNESIUM SULFATE 4 GM/100ML IV SOLN
4.0000 g | Freq: Once | INTRAVENOUS | Status: AC
  Administered 2018-01-08: 4 g via INTRAVENOUS
  Filled 2018-01-08: qty 100

## 2018-01-08 MED ORDER — ORAL CARE MOUTH RINSE
15.0000 mL | OROMUCOSAL | Status: DC
  Administered 2018-01-08: 15 mL via OROMUCOSAL

## 2018-01-08 MED ORDER — METOPROLOL TARTRATE 12.5 MG HALF TABLET: 12.5000 mg | ORAL_TABLET | Freq: Once | ORAL | Status: DC

## 2018-01-08 MED ORDER — EPHEDRINE SULFATE-NACL 50-0.9 MG/10ML-% IV SOSY
PREFILLED_SYRINGE | INTRAVENOUS | Status: DC | PRN
  Administered 2018-01-08: 5 mg via INTRAVENOUS
  Administered 2018-01-08: 10 mg via INTRAVENOUS

## 2018-01-08 MED ORDER — DOPAMINE-DEXTROSE 3.2-5 MG/ML-% IV SOLN
0.0000 ug/kg/min | INTRAVENOUS | Status: DC
  Administered 2018-01-08: 3 ug/kg/min via INTRAVENOUS

## 2018-01-08 MED ORDER — ETOMIDATE 2 MG/ML IV SOLN
INTRAVENOUS | Status: AC
  Filled 2018-01-08: qty 10

## 2018-01-08 MED ORDER — ONDANSETRON HCL 4 MG/2ML IJ SOLN: 4.0000 mg | Freq: Four times a day (QID) | INTRAMUSCULAR | Status: DC | PRN

## 2018-01-08 MED ORDER — 0.9 % SODIUM CHLORIDE (POUR BTL) OPTIME
TOPICAL | Status: DC | PRN
  Administered 2018-01-08: 6000 mL

## 2018-01-08 MED ORDER — SODIUM CHLORIDE 0.9% FLUSH: 3.0000 mL | INTRAVENOUS | Status: DC | PRN

## 2018-01-08 MED ORDER — BISACODYL 5 MG PO TBEC
10.0000 mg | DELAYED_RELEASE_TABLET | Freq: Every day | ORAL | Status: DC
  Administered 2018-01-09 – 2018-01-11 (×3): 10 mg via ORAL
  Filled 2018-01-08 (×3): qty 2

## 2018-01-08 MED ORDER — ETOMIDATE 2 MG/ML IV SOLN
INTRAVENOUS | Status: DC | PRN
  Administered 2018-01-08: 20 mg via INTRAVENOUS

## 2018-01-08 MED ORDER — SODIUM CHLORIDE 0.9 % IJ SOLN
INTRAMUSCULAR | Status: AC
  Filled 2018-01-08: qty 10

## 2018-01-08 MED ORDER — SODIUM CHLORIDE 0.9 % IV SOLN
INTRAVENOUS | Status: DC | PRN
  Administered 2018-01-08: 10 ug/min via INTRAVENOUS

## 2018-01-08 MED ORDER — ROCURONIUM BROMIDE 10 MG/ML (PF) SYRINGE
PREFILLED_SYRINGE | INTRAVENOUS | Status: AC
  Filled 2018-01-08: qty 10

## 2018-01-08 MED ORDER — PROPOFOL 10 MG/ML IV BOLUS
INTRAVENOUS | Status: DC | PRN
  Administered 2018-01-08 (×2): 100 mg via INTRAVENOUS

## 2018-01-08 MED ORDER — PANTOPRAZOLE SODIUM 40 MG PO TBEC
40.0000 mg | DELAYED_RELEASE_TABLET | Freq: Every day | ORAL | Status: DC
  Administered 2018-01-10 – 2018-01-11 (×2): 40 mg via ORAL
  Filled 2018-01-08 (×2): qty 1

## 2018-01-08 MED ORDER — VANCOMYCIN HCL IN DEXTROSE 1-5 GM/200ML-% IV SOLN
1000.0000 mg | Freq: Once | INTRAVENOUS | Status: AC
  Administered 2018-01-08: 1000 mg via INTRAVENOUS
  Filled 2018-01-08: qty 200

## 2018-01-08 MED ORDER — SODIUM CHLORIDE 0.9 % IV SOLN
INTRAVENOUS | Status: DC | PRN
  Administered 2018-01-08: 13:00:00 via INTRAVENOUS

## 2018-01-08 SURGICAL SUPPLY — 105 items
ADAPTER CARDIO PERF ANTE/RETRO (ADAPTER) ×4 IMPLANT
APPLICATOR TIP COSEAL (VASCULAR PRODUCTS) ×8 IMPLANT
ARTICLIP LAA PROCLIP II 40 (Clip) ×3 IMPLANT
ARTICLIP LAA PROCLIP II 40MM (Clip) ×1 IMPLANT
ATTRACTOMAT 16X20 MAGNETIC DRP (DRAPES) ×4 IMPLANT
BAG DECANTER FOR FLEXI CONT (MISCELLANEOUS) ×4 IMPLANT
BLADE STERNUM SYSTEM 6 (BLADE) ×4 IMPLANT
BLADE SURG 15 STRL LF DISP TIS (BLADE) ×2 IMPLANT
BLADE SURG 15 STRL SS (BLADE) ×2
CANISTER SUCT 3000ML PPV (MISCELLANEOUS) ×4 IMPLANT
CANN PRFSN .5XCNCT 15X34-48 (MISCELLANEOUS) ×2
CANNULA EZ GLIDE 8.0 24FR (CANNULA) ×4 IMPLANT
CANNULA GUNDRY RCSP 15FR (MISCELLANEOUS) ×8 IMPLANT
CANNULA PRFSN .5XCNCT 15X34-48 (MISCELLANEOUS) ×2 IMPLANT
CANNULA SUMP PERICARDIAL (CANNULA) ×4 IMPLANT
CANNULA VEN 2 STAGE (MISCELLANEOUS) ×2
CATH FOLEY 16FR TEMP PROBE (CATHETERS) ×4 IMPLANT
CATH HEART VENT LEFT (CATHETERS) ×2 IMPLANT
CATH ROBINSON RED A/P 18FR (CATHETERS) ×12 IMPLANT
CATH THORACIC 36FR (CATHETERS) ×4 IMPLANT
CATH THORACIC 36FR RT ANG (CATHETERS) ×4 IMPLANT
CAUTERY HIGH TEMP VAS (MISCELLANEOUS) ×4 IMPLANT
CAUTERY SURG HI TEMP FINE TIP (MISCELLANEOUS) ×4 IMPLANT
CONT SPEC 4OZ CLIKSEAL STRL BL (MISCELLANEOUS) ×12 IMPLANT
COVER SURGICAL LIGHT HANDLE (MISCELLANEOUS) ×12 IMPLANT
CRADLE DONUT ADULT HEAD (MISCELLANEOUS) ×4 IMPLANT
DEVICE ATRICLIP LAA PRCLPII 40 (Clip) ×2 IMPLANT
DRAPE SLUSH/WARMER DISC (DRAPES) IMPLANT
DRSG COVADERM 4X14 (GAUZE/BANDAGES/DRESSINGS) ×4 IMPLANT
ELECT CAUTERY BLADE 6.4 (BLADE) ×4 IMPLANT
ELECT REM PT RETURN 9FT ADLT (ELECTROSURGICAL) ×8
ELECTRODE REM PT RTRN 9FT ADLT (ELECTROSURGICAL) ×4 IMPLANT
FELT TEFLON 1X6 (MISCELLANEOUS) ×8 IMPLANT
GAUZE SPONGE 4X4 12PLY STRL (GAUZE/BANDAGES/DRESSINGS) ×4 IMPLANT
GAUZE SPONGE 4X4 12PLY STRL LF (GAUZE/BANDAGES/DRESSINGS) ×4 IMPLANT
GLOVE BIO SURGEON STRL SZ 6 (GLOVE) IMPLANT
GLOVE BIO SURGEON STRL SZ 6.5 (GLOVE) ×15 IMPLANT
GLOVE BIO SURGEON STRL SZ7 (GLOVE) IMPLANT
GLOVE BIO SURGEON STRL SZ7.5 (GLOVE) ×4 IMPLANT
GLOVE BIO SURGEON STRL SZ8 (GLOVE) ×12 IMPLANT
GLOVE BIO SURGEONS STRL SZ 6.5 (GLOVE) ×5
GLOVE BIOGEL PI IND STRL 8 (GLOVE) ×6 IMPLANT
GLOVE BIOGEL PI INDICATOR 8 (GLOVE) ×6
GLOVE EUDERMIC 7 POWDERFREE (GLOVE) ×8 IMPLANT
GOWN STRL REUS W/ TWL LRG LVL3 (GOWN DISPOSABLE) ×8 IMPLANT
GOWN STRL REUS W/ TWL XL LVL3 (GOWN DISPOSABLE) ×4 IMPLANT
GOWN STRL REUS W/TWL LRG LVL3 (GOWN DISPOSABLE) ×8
GOWN STRL REUS W/TWL XL LVL3 (GOWN DISPOSABLE) ×4
GRAFT GELWEAVE VALSALVA 30CM (Prosthesis & Implant Heart) ×4 IMPLANT
GRAFT HEMASHIELD 30X10 (Vascular Products) ×4 IMPLANT
HEMOSTAT POWDER SURGIFOAM 1G (HEMOSTASIS) IMPLANT
HEMOSTAT SURGICEL 2X14 (HEMOSTASIS) ×8 IMPLANT
KIT BASIN OR (CUSTOM PROCEDURE TRAY) ×4 IMPLANT
KIT CATH CPB BARTLE (MISCELLANEOUS) ×4 IMPLANT
KIT DRAINAGE VACCUM ASSIST (KITS) ×4 IMPLANT
KIT SUCTION CATH 14FR (SUCTIONS) ×4 IMPLANT
KIT TURNOVER KIT B (KITS) ×4 IMPLANT
LINE VENT (MISCELLANEOUS) ×4 IMPLANT
LOOP VESSEL MAXI BLUE (MISCELLANEOUS) ×4 IMPLANT
LOOP VESSEL SUPERMAXI WHITE (MISCELLANEOUS) ×4 IMPLANT
NS IRRIG 1000ML POUR BTL (IV SOLUTION) ×28 IMPLANT
PACK E OPEN HEART (SUTURE) ×4 IMPLANT
PACK OPEN HEART (CUSTOM PROCEDURE TRAY) ×4 IMPLANT
PAD ARMBOARD 7.5X6 YLW CONV (MISCELLANEOUS) ×8 IMPLANT
SEALANT SURG COSEAL 8ML (VASCULAR PRODUCTS) ×4 IMPLANT
SET CARDIOPLEGIA MPS 5001102 (MISCELLANEOUS) ×4 IMPLANT
SET VEIN GRAFT PERF (SET/KITS/TRAYS/PACK) ×4 IMPLANT
SPONGE LAP 18X18 X RAY DECT (DISPOSABLE) ×4 IMPLANT
SUT BONE WAX W31G (SUTURE) ×4 IMPLANT
SUT ETHIBON 2 0 V 52N 30 (SUTURE) ×12 IMPLANT
SUT ETHIBON EXCEL 2-0 V-5 (SUTURE) IMPLANT
SUT ETHIBOND 2 0 SH (SUTURE) ×6
SUT ETHIBOND 2 0 SH 36X2 (SUTURE) ×6 IMPLANT
SUT ETHIBOND V-5 VALVE (SUTURE) IMPLANT
SUT PROLENE 3 0 SH 1 (SUTURE) ×4 IMPLANT
SUT PROLENE 3 0 SH 48 (SUTURE) ×8 IMPLANT
SUT PROLENE 3 0 SH DA (SUTURE) ×16 IMPLANT
SUT PROLENE 4 0 RB 1 (SUTURE) ×10
SUT PROLENE 4-0 RB1 .5 CRCL 36 (SUTURE) ×10 IMPLANT
SUT PROLENE 5 0 C 1 36 (SUTURE) ×8 IMPLANT
SUT PROLENE 5 0 RB 2 (SUTURE) ×8 IMPLANT
SUT PROLENE 6 0 C 1 30 (SUTURE) ×4 IMPLANT
SUT SILK 1 TIES 10X30 (SUTURE) ×4 IMPLANT
SUT SILK 2 0 SH CR/8 (SUTURE) ×8 IMPLANT
SUT STEEL 6MS V (SUTURE) IMPLANT
SUT STEEL STERNAL CCS#1 18IN (SUTURE) IMPLANT
SUT STEEL SZ 6 DBL 3X14 BALL (SUTURE) ×12 IMPLANT
SUT VIC AB 1 CTX 36 (SUTURE) ×4
SUT VIC AB 1 CTX36XBRD ANBCTR (SUTURE) ×4 IMPLANT
SUT VIC AB 2-0 CT1 27 (SUTURE)
SUT VIC AB 2-0 CT1 TAPERPNT 27 (SUTURE) IMPLANT
SUT VIC AB 3-0 X1 27 (SUTURE) IMPLANT
SYSTEM SAHARA CHEST DRAIN ATS (WOUND CARE) ×4 IMPLANT
TAPE CLOTH SURG 4X10 WHT LF (GAUZE/BANDAGES/DRESSINGS) ×4 IMPLANT
TAPE PAPER 2X10 WHT MICROPORE (GAUZE/BANDAGES/DRESSINGS) ×4 IMPLANT
TOWEL GREEN STERILE (TOWEL DISPOSABLE) ×4 IMPLANT
TOWEL GREEN STERILE FF (TOWEL DISPOSABLE) ×4 IMPLANT
TRAY FOLEY SLVR 14FR TEMP STAT (SET/KITS/TRAYS/PACK) IMPLANT
TUBE CONNECTING 12'X1/4 (SUCTIONS) ×1
TUBE CONNECTING 12X1/4 (SUCTIONS) ×3 IMPLANT
UNDERPAD 30X30 (UNDERPADS AND DIAPERS) ×4 IMPLANT
VALVE MAGNA EASE AORTIC 27MM (Prosthesis & Implant Heart) ×4 IMPLANT
VENT LEFT HEART 12002 (CATHETERS) ×4
WATER STERILE IRR 1000ML POUR (IV SOLUTION) ×8 IMPLANT
YANKAUER SUCT BULB TIP NO VENT (SUCTIONS) ×4 IMPLANT

## 2018-01-08 NOTE — Addendum Note (Signed)
Addendum  created 01/08/18 1714 by Philomena CourseWhite, Gita Dilger Tena Leaira Fullam, CRNA   Charge Capture section accepted

## 2018-01-08 NOTE — Transfer of Care (Signed)
Immediate Anesthesia Transfer of Care Note  Patient: Caleb BellingSamir M [name]  Procedure(s) Performed: BENTALL PROCEDURE (N/A Chest) ASCENDING AORTIC ROOT REPLACEMENT (N/A Chest) TRANSESOPHAGEAL ECHOCARDIOGRAM (TEE) (N/A ) CLIPPING OF ATRIAL APPENDAGE using Atricure Clip size 40 (N/A Chest)  Patient Location: ICU  Anesthesia Type:General  Level of Consciousness: Patient remains intubated per anesthesia plan  Airway & Oxygen Therapy: Patient remains intubated per anesthesia plan and Patient placed on Ventilator (see vital sign flow sheet for setting)  Post-op Assessment: Report given to RN and Post -op Vital signs reviewed and stable  Post vital signs: Reviewed and stable  Last Vitals:  Vitals Value Taken Time  BP    Temp    Pulse    Resp    SpO2      Last Pain:  Vitals:   01/08/18 0605  PainSc: 0-No pain         Complications: No apparent anesthesia complications

## 2018-01-08 NOTE — Anesthesia Procedure Notes (Signed)
Central Venous Catheter Insertion Performed by: Heather RobertsSinger, Ned Kakar, MD, anesthesiologist Start/End8/07/2017 7:01 AM, 01/08/2018 7:11 AM Patient location: Pre-op. Preanesthetic checklist: patient identified, IV checked, site marked, risks and benefits discussed, surgical consent, monitors and equipment checked, pre-op evaluation, timeout performed and anesthesia consent Position: Trendelenburg Lidocaine 1% used for infiltration and patient sedated Hand hygiene performed , maximum sterile barriers used  and Seldinger technique used Catheter size: 8.5 Fr Total catheter length 8. PA cath was placed.Sheath introducer Swan type:thermodilution PA Cath depth:50 Procedure performed using ultrasound guided technique. Ultrasound Notes:anatomy identified, needle tip was noted to be adjacent to the nerve/plexus identified, no ultrasound evidence of intravascular and/or intraneural injection and image(s) printed for medical record Attempts: 1 Following insertion, line sutured and dressing applied. Post procedure assessment: free fluid flow, blood return through all ports and no air  Patient tolerated the procedure well with no immediate complications.

## 2018-01-08 NOTE — OR Nursing (Signed)
12:45 - 45 minute call to SICU charge nurse 

## 2018-01-08 NOTE — OR Nursing (Signed)
14:50 - 20 minute call to SICU charge nurse

## 2018-01-08 NOTE — Progress Notes (Signed)
NIF -20, VC 1.8L  IS 1250

## 2018-01-08 NOTE — Interval H&P Note (Signed)
History and Physical Interval Note:  01/08/2018 7:24 AM  Caleb [name]  has presented today for surgery, with the diagnosis of BISCUPID AV STENOSIS  AI ASCENDING AORTIC ANEURYSM  The various methods of treatment have been discussed with the patient and family. After consideration of risks, benefits and other options for treatment, the patient has consented to  Procedure(s) with comments: BENTALL PROCEDURE (N/A) - CIRC ARREST ASCENDING AORTIC ROOT REPLACEMENT (N/A) TRANSESOPHAGEAL ECHOCARDIOGRAM (TEE) (N/A) as a surgical intervention .  The patient's history has been reviewed, patient examined, no change in status, stable for surgery.  I have reviewed the patient's chart and labs.  Questions were answered to the patient's satisfaction.     Alleen BorneBryan K Bartle

## 2018-01-08 NOTE — Anesthesia Postprocedure Evaluation (Signed)
Anesthesia Post Note  Patient: Caleb [name]  Procedure(s) Performed: BENTALL PROCEDURE (N/A Chest) ASCENDING AORTIC ROOT REPLACEMENT (N/A Chest) TRANSESOPHAGEAL ECHOCARDIOGRAM (TEE) (N/A ) CLIPPING OF ATRIAL APPENDAGE using Atricure Clip size 40 (N/A Chest)     Patient location during evaluation: SICU Anesthesia Type: General Level of consciousness: sedated Pain management: pain level controlled Vital Signs Assessment: post-procedure vital signs reviewed and stable Respiratory status: patient remains intubated per anesthesia plan Cardiovascular status: stable Postop Assessment: no apparent nausea or vomiting Anesthetic complications: no    Last Vitals:  Vitals:   01/08/18 0557 01/08/18 1523  BP: 124/69   Pulse: 72   Resp:    Temp: (!) 36.3 C   SpO2:  100%    Last Pain:  Vitals:   01/08/18 0605  PainSc: 0-No pain                 Caleb [name]

## 2018-01-08 NOTE — Anesthesia Procedure Notes (Signed)
Procedure Name: Intubation Date/Time: 01/08/2018 7:47 AM Performed by: White, Amedeo Plenty, CRNA Pre-anesthesia Checklist: Patient identified, Emergency Drugs available, Suction available and Patient being monitored Patient Re-evaluated:Patient Re-evaluated prior to induction Oxygen Delivery Method: Circle System Utilized Preoxygenation: Pre-oxygenation with 100% oxygen Induction Type: IV induction Ventilation: Mask ventilation without difficulty Laryngoscope Size: Mac and 3 Grade View: Grade I Tube type: Oral Tube size: 8.0 mm Number of attempts: 1 Airway Equipment and Method: Stylet Placement Confirmation: ETT inserted through vocal cords under direct vision,  positive ETCO2 and breath sounds checked- equal and bilateral Secured at: 24 cm Tube secured with: Tape Dental Injury: Teeth and Oropharynx as per pre-operative assessment

## 2018-01-08 NOTE — Brief Op Note (Signed)
01/08/2018  7:41 AM  PATIENT:  Caleb [name]  57 y.o. male  PRE-OPERATIVE DIAGNOSIS:  BISCUPID AV STENOSIS  AI ASCENDING AORTIC ANEURYSM  POST-OPERATIVE DIAGNOSIS:  AI ASCENDING AORTIC ANEURYSM  PROCEDURE:  Procedure(s) with comments: BENTALL PROCEDURE (N/A) - CIRC ARREST ASCENDING AORTIC ROOT REPLACEMENT (N/A) TRANSESOPHAGEAL ECHOCARDIOGRAM (TEE) (N/A) CLIPPING OF ATRIAL APPENDAGE using Atricure Clip size 40 (N/A)  SURGEON:  Surgeon(s) and Role:    * Bartle, Payton DoughtyBryan K, MD - Primary  PHYSICIAN ASSISTANT: WAYNE GOLD PA-C  ASSISTANTS: MARIE IRWIN RNFA  ANESTHESIA:   general  EBL:  1475 mL   BLOOD ADMINISTERED:none  DRAINS: PLEURAL AND PERICARDIAL CHEST DRAINS   LOCAL MEDICATIONS USED:  NONE  SPECIMEN:  Source of Specimen:  AORTIC ANEURYSM  DISPOSITION OF SPECIMEN:  PATHOLOGY  COUNTS:  YES  TOURNIQUET:  * No tourniquets in log *  DICTATION: .Dragon Dictation  PLAN OF CARE: Admit to inpatient   PATIENT DISPOSITION:  ICU - intubated and hemodynamically stable.   Delay start of Pharmacological VTE agent (>24hrs) due to surgical blood loss or risk of bleeding: yes

## 2018-01-08 NOTE — Anesthesia Preprocedure Evaluation (Addendum)
Anesthesia Evaluation  Patient identified by MRN, date of birth, ID band Patient awake    Reviewed: Allergy & Precautions, NPO status , Patient's Chart, lab work & pertinent test results  History of Anesthesia Complications Negative for: history of anesthetic complications  Airway Mallampati: II  TM Distance: >3 FB Neck ROM: Full    Dental no notable dental hx. (+) Dental Advisory Given   Pulmonary neg pulmonary ROS,    Pulmonary exam normal        Cardiovascular hypertension, +CHF  Normal cardiovascular exam+ dysrhythmias Atrial Fibrillation + Valvular Problems/Murmurs AI    There is severe left ventricular systolic dysfunction.  LV end diastolic pressure is normal.  The left ventricular ejection fraction is 25-35% by visual estimate.   1. Nonobstructive CAD 2. Severe LV dysfunction. EF 25-30% with global hypokinesis 3. Normal LV filling pressures 4. Normal right heart pressures 5. No significant AV gradient  Plan: Surgery for thoracic Aortic aneurysm and bicuspid AV.    Neuro/Psych CVA negative psych ROS   GI/Hepatic negative GI ROS, Neg liver ROS,   Endo/Other  negative endocrine ROS  Renal/GU negative Renal ROS     Musculoskeletal negative musculoskeletal ROS (+)   Abdominal   Peds  Hematology negative hematology ROS (+)   Anesthesia Other Findings Day of surgery medications reviewed with the patient.  Reproductive/Obstetrics                            Anesthesia Physical Anesthesia Plan  ASA: IV  Anesthesia Plan: General   Post-op Pain Management:    Induction: Intravenous  PONV Risk Score and Plan: 3 and Ondansetron, Dexamethasone and Midazolam  Airway Management Planned: Oral ETT  Additional Equipment: Arterial line, PA Cath, 3D TEE and Ultrasound Guidance Line Placement  Intra-op Plan:   Post-operative Plan: Post-operative intubation/ventilation  Informed  Consent: I have reviewed the patients History and Physical, chart, labs and discussed the procedure including the risks, benefits and alternatives for the proposed anesthesia with the patient or authorized representative who has indicated his/her understanding and acceptance.   Dental advisory given  Plan Discussed with: CRNA, Anesthesiologist and Surgeon  Anesthesia Plan Comments:       Anesthesia Quick Evaluation

## 2018-01-08 NOTE — Progress Notes (Signed)
Patient ID: Caleb [name], male   DOB: 07/05/1960, 57 y.o.   MRN: [id number]  TCTS Evening Rounds:   Hemodynamically stable  V-paced 80 in chronic atrial fib CI = 1.4, PA pressures low.  Has started to wake up on vent. Moves all extremities   Urine output good  CT output low  CBC    Component Value Date/Time   WBC 10.6 (H) 01/08/2018 1603   RBC 4.47 01/08/2018 1603   HGB 12.8 (L) 01/08/2018 1603   HGB 16.4 12/15/2017 1352   HCT 39.0 01/08/2018 1603   HCT 49.8 12/15/2017 1352   PLT 145 (L) 01/08/2018 1603   PLT 236 12/15/2017 1352   MCV 87.2 01/08/2018 1603   MCV 87 12/15/2017 1352   MCH 28.6 01/08/2018 1603   MCHC 32.8 01/08/2018 1603   RDW 13.0 01/08/2018 1603   RDW 13.8 12/15/2017 1352   LYMPHSABS 2.0 01/24/2017 2120   MONOABS 0.5 01/24/2017 2120   EOSABS 0.3 01/24/2017 2120   BASOSABS 0.1 01/24/2017 2120     BMET    Component Value Date/Time   NA 142 01/08/2018 1402   NA 141 12/15/2017 1352   K 4.1 01/08/2018 1402   CL 105 01/08/2018 1402   CO2 24 01/06/2018 1219   GLUCOSE 136 (H) 01/08/2018 1402   BUN 14 01/08/2018 1402   BUN 18 12/15/2017 1352   CREATININE 0.90 01/08/2018 1402   CALCIUM 9.1 01/06/2018 1219   GFRNONAA >60 01/06/2018 1219   GFRAA >60 01/06/2018 1219     A/P:  Stable postop course. Continue current plans

## 2018-01-08 NOTE — Progress Notes (Signed)
  Echocardiogram 2D Echocardiogram has been performed.  Caleb ChimesWendy  Saydee [name] 01/08/2018, 8:42 AM

## 2018-01-08 NOTE — Op Note (Signed)
CARDIOVASCULAR SURGERY OPERATIVE NOTE  01/08/2018  Surgeon:  Alleen Borne, MD  First Assistant: Gershon Crane,  PA-C   Preoperative Diagnosis:  Bicuspid aortic valve with moderate AI, 5.5 cm aortic root and ascending aortic aneurysm, chronic persistent atrial fibrillation.   Postoperative Diagnosis:  Same   Procedure:  1. Median Sternotomy 2. Extracorporeal circulation 3.   Replacement of the ascending aorta (hemi-arch) using a 30 mm Hemashield graft under deep hypothermic circulatory arrest 4.   Biological Bentall Procedure using a 27 mm Edwards Magna-Ease pericardial valve and a 30 mm Gelweave Valsalva graft. 5.   Placement of clip on left atrial appendage  Anesthesia:  General Endotracheal   Clinical History/Surgical Indication:  The patient is a 57 year old gentleman with a history of atrial fibrillation on Eliquis, prior strokes oneof which occurred last year while on Eliquis, hypertension, known bicuspid aortic valve with an ascending aortic aneurysm, and chronic systolic and diastolic heart failure. I saw him in 2012 for initial consultation for a 5.1 cm fusiform ascending aortic aneurysm and recommended followup in 6 months but he never returned. He has lived in [place] and has been returning to [place] where his family lives intermittently. He was diagnosed by Dr. Royann Shivers with idiopathic dilated cardiomyopathy in July 2018 with an echo showing an LVEF of 20%. His only prior echo was in 2002 when his EF was 40-45%. He was taken off diltiazem and started on Coreg, digoxin and Losartan. He presented at that time with an acute stroke with dysarthria and right arm weakness and MRI showed three tiny acute cortical infarcts in the bilateral frontal and left parietal lobes consistent with bilateral ACA and left MCA territories. There was not significant stenosis or occlusion of the intracranial vessels. It was felt that this was most likely embolic from his atrial fib. Echo at that  time showed an EF of 20% with a moderately dilated LV, moderate LVH, possible apical thrombus. The aortic valve is bicuspid with no stenosis and moderate AI. The leaflets were moderately thickened and calcified. CTA of the chest showed some enlargement of the ascending aortic aneurysm to 5.3 x 5.5 cm. This aneurysm extends from the aortic root up to the proximal arch with bovine origin of the innominate and left carotid arteries. I  saw him on 01/26/2017 at that time he just been started on medical therapy for his cardiomyopathy and it had a recent embolic stroke and we feltit wouldbe best to let him recover for a while before considering surgical treatment of his aortic aneurysm. He says that he has been feeling well and walkingand going to the gym without difficulty. He denies any chest pain or pressure. He has had some shortness of breath with climbing stairs and doing any heavy activity but his normal daily activity does not cause any symptoms. He denies any orthopnea or PND. He has had no peripheral edema. He has returned home to [address] [facility] for as long as necessary to get his aneurysm repaired. He saw Dr. Marlowe Alt February and had a repeat echocardiogram showing persistent left ventricular systolic dysfunction with ejection fraction of 25 to 30%.The aortic valve was functionally bicuspid with moderate central regurgitation.The mean gradient was 8 mmHg. There was mild to moderate mitral regurgitation. Cardiac catheterization on 12/18/2017 by Dr. [place] showed a left ventricular ejection fraction of 25 to 30% with global hypokinesis. There is nonobstructive coronary disease. There were normal left ventricular filling pressures and normal right heart pressures. There is no significant gradient measured  across aortic valve.  This gentleman has a bicuspid aortic valve with moderate aortic insufficiency and a 5.4 cm fusiform ascending aortic aneurysm. He also has severe left ventricular systolic  dysfunction with ejection fraction of 25 to 30% with diffuse hypokinesis with left ventricular dilation.He has New York Heart Association class II symptoms of exertional fatigue and shortness of breath as well as occasional orthopneaconsistent with chronic diastolic congestive heart failure. I think the best option for this patient is replacement of his aortic valve and ascending aortic aneurysm to decrease the risk of aortic dissection and to prevent further left ventricular deterioration due to his aortic stenosis and regurgitation. I reviewed the echocardiogram and cardiac catheterization findings again with him and the indications for surgery. He would like to proceed. We discussed the alternatives of mechanical and bioprosthetic valves. He has had persistentatrial fibrillation and has been on Eliquis. He has hadprior strokes, oneof which occurred last year while on Eliquis. He does not want to be on Coumadin due to the required follow-up to monitor the adequacy of anticoagulation. He travels a lot and does not want to have to watch his diet. He would like to use a bioprosthetic valve and remain on Eliquis for his atrial fibrillation. I think that is a reasonable alternative given his age,left ventricular dysfunction,and aversion to Coumadinwith the possibility that he may not be completely compliant.I would also plan to place a clip on the left atrial appendage. I discussed the operative procedure with the patient including alternatives, benefits and risks; including but not limited to bleeding, blood transfusion, infection, stroke, myocardial infarction, graft failure, heart block requiring a permanent pacemaker, organ dysfunction, and death. [name] Ganimunderstands and agrees to proceed.    Preparation:  The patient was seen in the preoperative holding area and the correct patient, correct operation were confirmed with the patient after reviewing the medical record and  catheterization. The consent was signed by me. Preoperative antibiotics were given. A pulmonary arterial line and radial arterial line were placed by the anesthesia team. The patient was taken back to the operating room and positioned supine on the operating room table. After being placed under general endotracheal anesthesia by the anesthesia team a foley catheter was placed. The neck, chest, abdomen, and both legs were prepped with betadine soap and solution and draped in the usual sterile manner. A surgical time-out was taken and the correct patient and operative procedure were confirmed with the nursing and anesthesia staff.  TEE:  Performed by Dr. Adonis Huguenin.   Caleb [name]  ECHO TEE (OR)  Order# [id number]  Reading physician: Heather Roberts, MD Ordering physician: Alleen Borne, MD Study date: 01/08/18  Conclusions   Result status: Final result   Aortic valve: The valve is bicuspid. Moderate valve thickening present. Mildly decreased leaflet separation. No stenosis. Moderate to severe regurgitation. No AV vegetation.  Aorta: The ascending aorta is severely dilated.  Mitral valve: Mild regurgitation.  Right ventricle: Normal wall thickness. Cavity is mildly dilated. Moderately reduced systolic function.  Tricuspid valve: Mild regurgitation. The tricuspid valve regurgitation jet is central.  Pulmonic valve: Mild regurgitation.  Aorta: Graft present in the ascending aorta.  Right ventricle: Mildly reduced systolic function    Post-Op TEE AORTA A graft was placed in the ascending aorta for repair.  LEFT VENTRICLE EF post - op is 30%. Post-op, LV cavity is mildly dilated. Wall motion is normal.  RIGHT VENTRICLE Post-op, RV systolic function is mildly reduced.  AORTIC VALVE A bioprosthetic  valve was placed. Valve is well seated. Leaflet is thin and freely mobile. paravalvular leak and The transaortic gradient is normal post replacement.  MITRAL VALVE Mitral valve unchanged from  pre-bypass.  TRICUSPID VALVE Tricuspid valve unchanged from pre-bypass.    Cardiopulmonary Bypass:  A median sternotomy was performed. The pericardium was opened in the midline. Right ventricular function appeared normal. The ascending aorta was of normal size and had no palpable plaque. There were no contraindications to aortic cannulation or cross-clamping. The patient was fully systemically heparinized and the ACT was maintained > 400 sec. The distal ascending aorta was cannulated with a 24 F aortic cannula for arterial inflow. Venous cannulation was performed via the right atrial appendage using a two-staged venous cannula. Aortic occlusion was performed with a single cross-clamp placed on the graft. Systemic cooling to 18 degrees Centigrade and topical cooling of the heart with iced saline were used. Hyperkalemic retrograde cold blood cardioplegia was used to induce diastolic arrest and was then given both into the coronary ostia using a handheld cannula and retrograde at about 20 minute intervals throughout the period of arrest to maintain myocardial temperature at or below 10 degrees centigrade. A temperature probe was inserted into the interventricular septum and an insulating pad was placed in the pericardium. CO2 was insufflated into the pericardium throughout the case to minimize intracardiac air.    Resection and grafting of ascending aortic aneurysm:  The patient was placed on cardiopulmonary bypass and a left ventricular vent was placed via the right superior pulmonary vein. Systemic cooling was begun with a goal temperature of 22 degrees centigrade by bladder and rectal temperature probes. A retrograde cardioplegia cannula was placed through the right atrium into the coronary sinus without difficulty. A retrograde cerebral perfusion cannula was placed into the SVC through a pursestring suture and the SVC was encircled with a silastic tape. After 30 minutes of cooling the target  temperature of 22 degrees centigrade was reached. Cerebral oximetry was 70% bilaterally. BIS was zero. The patient was given Propofol and 125 mg of Solumedrol. The head was packed in ice. The bed was placed in steep trendelenburg. Circulatory arrest was begun and the blood volume emptied into the venous reservoir. Continuous retrograde cerebraplegia was begun and the SVC occluded with the silastic tape. Cold blood retrograde cardioplegia and direct coronary ostial cardioplegia were given and myocardial temperature dropped to 10 degrees centigrade. Additional doses were given at approximately 20 minute intervals throughout the period of circulatory arrest and cross-clamping. Complete diastolic arrest was maintained. The aortic cannula was removed. The aorta was transected just proximal to the innominate artery beveling the resection out along the undersurface of the aortic arch (Hemiarch replacement). The aortic diameter was measured at 30 mm here. A 30 x 10 mm Hemasheild Platinum vascular graft was prepared. ( Catalog # [id number] P0, Lot [id number], SN [id number]). It was anastomosed to the aortic arch in an end to end manner using 3-0 prolene continuous suture with a felt strip to reinforce the anastomisis. A light coating of CoSeal was applied to seal needle holes. The arterial end of the bypass circuit was then connected to the 10mm side arm graft and circulation was slowly resumed. The tape was removed from the SVC. The aortic graft was cross-clamped proximal to the side arm graft and full CPB support was resumed. Circulatory arrest time was 20 minutes. Retrograde cerebral perfusion time was 16 minutes.   Bentall Procedure:   The ascending aorta was mobilized from the right pulmonary  artery and main PA. It was opened longitudinally and the valve inspected. It was a bicuspid valve with fusion of the left and right cusps with 3 commissures and a single raphe. The right and left coronary arteries were removed  from the aortic root with a button of aortic wall around the ostia.  They were retracted carefully out of the way with stay sutures to prevent rotation. The native valve was excised taking care to remove all particulate debri. The annulus was decalcified with rongeurs. The annulus was sized and a 27 mm Edwards Magna-Ease pericardial valve was chosen. ( Model # [id number], Serial # [id number]). A series of pledgetted 2-0 Ethibond horizontal mattress sutures were placed around the annulus with the pledgets in a sub-annular position. A 30 mm Gelweave Valsalva graft was chosen ( Ref # [id number] ADP, Lot # [id number], SN [id number]). The proximal cuff was trimmed so that there were 3 rings remaining. The valve was placed in the graft so that the sewing cuff was adjacent to the proximal graft cuff. The commissural posts were lined up with the vertical marker sutures on the graft and the valve and graft were held in position using a 5-0 prolene suture at each commissure.  The valve was lowered into place and the sutures tied . The valve seated nicely.  Small openings were made in the graft for the coronary anastomoses using a thermal cautery. Then the left and right coronary buttons were anastomosed to the graft in an end to side manner using continuous 5-0 prolene suture. A light coating of CoSeal was applied to each anastomosis for hemostasis. The two grafts were then cut to the appropriate length and anastomosed end to end using continuous 3-0 prolene suture. CoSeal was applied to seal the needle holes in the grafts. A vent cannula was placed into the graft to remove any air. Deairing maneuvers were performed and the bed placed in trendelenburg position.  Ligation of left atrial appendage:   The base of the appendage was measured and a 40 mm mitral Atricure Atriclip was chosen. This was placed across the base of the LAA without difficulty.    Completion:   The patient was rewarmed to 37 degrees Centigrade. The  crossclamp was removed with a time of 135 minutes. There was spontaneous return of sinus rhythm. The position of the grafts was satisfactory. The vascular anastomoses all appeared hemostatic. Two temporary epicardial pacing wires were placed on the right atrium and two on the right ventricle. The patient was weaned from CPB without difficulty on renal dopamine at 2 mcg. CPB time was 208 minutes. Cardiac output was 6 LPM. TEE showed a normal functioning aortic valve prosthesis with no AI. There was unchanged mild MR. LV function appeared unchanged with an EF of 30%.  Heparin was fully reversed with protamine and the aortic and venous cannulas removed. While I was checking for hemostasis there was some bleeding that started from the proximal suture line at the non-coronary annulus. This area was impossible to see well enough to pin point the sight or placed a suture. I thought the safest thing to do was to go back on pump. Therefore another dose of heparin was given. When the ACT was > 400 I placed an 8 mm straight arterial cannula through the stump of the aortic side graft and inserted the venous cannula through the old cannulation site in the appendage. He was placed back on bypass. Then I was able to visualize the proximal annular suture line and  there was bleeding from one site along the non-coronary annulus. This was repaired with a single 3-0 prolene pledgetted mattress suture. Then sutures lines were again examined completely and were all hemostatic. The patient was then weaned from bypass after an additional 14 minutes. Protamine was given. Hemostasis was achieved. Mediastinal drainage tubes were placed. The sternum was closed with double #6 stainless steel wires. The fascia was closed with continuous # 1 vicryl suture. The subcutaneous tissue was closed with 2-0 vicryl continuous suture. The skin was closed with 3-0 vicryl subcuticular suture. All sponge, needle, and instrument counts were reported correct at  the end of the case. Dry sterile dressings were placed over the incisions and around the chest tubes which were connected to pleurevac suction. The patient was then transported to the surgical intensive care unit in stable condition.

## 2018-01-08 NOTE — Anesthesia Procedure Notes (Signed)
Arterial Line Insertion Start/End8/07/2017 6:45 AM Performed by: White, Cordella RegisterKelsey Tena Karyme Mcconathy, Scientist, clinical (histocompatibility and immunogenetics)CRNA, CRNA  Preanesthetic checklist: patient identified, IV checked, risks and benefits discussed, surgical consent, monitors and equipment checked and timeout performed Lidocaine 1% used for infiltration Left, radial was placed Catheter size: 20 G Hand hygiene performed  and maximum sterile barriers used  Allen's test indicative of satisfactory collateral circulation Attempts: 1 Procedure performed without using ultrasound guided technique. Following insertion, dressing applied and Biopatch. Post procedure assessment: normal  Patient tolerated the procedure well with no immediate complications.

## 2018-01-08 NOTE — Procedures (Signed)
Extubation Procedure Note  Patient Details:   Name: Caleb BellingSamir M Skousen DOB: 01/27/1961 MRN: [id number]   Airway Documentation:    Vent end date: 01/08/18 Vent end time: 1950   Evaluation  O2 sats: stable throughout Complications: No apparent complications Patient did tolerate procedure well. Bilateral Breath Sounds: Fine crackles   Yes   Patient ext[facility] to [facility] and is tolerating well. Patient has an adequate cough and is able to speak clearly.  Sharene SkeansSilva, Kylieann Eagles C 01/08/2018, 7:58 PM

## 2018-01-09 ENCOUNTER — Inpatient Hospital Stay (HOSPITAL_COMMUNITY): Payer: Self-pay

## 2018-01-09 ENCOUNTER — Other Ambulatory Visit: Payer: Self-pay

## 2018-01-09 LAB — CBC
HCT: 31.2 % — ABNORMAL LOW (ref 39.0–52.0)
HEMATOCRIT: 32.1 % — AB (ref 39.0–52.0)
Hemoglobin: 10.2 g/dL — ABNORMAL LOW (ref 13.0–17.0)
Hemoglobin: 10.4 g/dL — ABNORMAL LOW (ref 13.0–17.0)
MCH: 28.8 pg (ref 26.0–34.0)
MCH: 28.8 pg (ref 26.0–34.0)
MCHC: 32.7 g/dL (ref 30.0–36.0)
MCHC: 32.4 g/dL (ref 30.0–36.0)
MCV: 88.1 fL (ref 78.0–100.0)
MCV: 88.9 fL (ref 78.0–100.0)
PLATELETS: 113 10*3/uL — AB (ref 150–400)
Platelets: 102 10*3/uL — ABNORMAL LOW (ref 150–400)
RBC: 3.54 MIL/uL — ABNORMAL LOW (ref 4.22–5.81)
RBC: 3.61 MIL/uL — ABNORMAL LOW (ref 4.22–5.81)
RDW: 13.1 % (ref 11.5–15.5)
RDW: 13.3 % (ref 11.5–15.5)
WBC: 9.8 10*3/uL (ref 4.0–10.5)
WBC: 11.2 10*3/uL — AB (ref 4.0–10.5)

## 2018-01-09 LAB — BASIC METABOLIC PANEL
Anion gap: 5 (ref 5–15)
BUN: 12 mg/dL (ref 6–20)
CALCIUM: 7.5 mg/dL — AB (ref 8.9–10.3)
CO2: 25 mmol/L (ref 22–32)
Chloride: 107 mmol/L (ref 98–111)
Creatinine, Ser: 0.93 mg/dL (ref 0.61–1.24)
GFR calc Af Amer: >60 mL/min (ref >60)
GLUCOSE: 118 mg/dL — AB (ref 70–99)
POTASSIUM: 4 mmol/L (ref 3.5–5.1)
Sodium: 137 mmol/L (ref 135–145)

## 2018-01-09 LAB — GLUCOSE, CAPILLARY
GLUCOSE-CAPILLARY: 122 mg/dL — AB (ref 70–99)
GLUCOSE-CAPILLARY: 106 mg/dL — AB (ref 70–99)
GLUCOSE-CAPILLARY: 108 mg/dL — AB (ref 70–99)
GLUCOSE-CAPILLARY: 115 mg/dL — AB (ref 70–99)
Glucose-Capillary: 110 mg/dL — ABNORMAL HIGH (ref 70–99)
Glucose-Capillary: 110 mg/dL — ABNORMAL HIGH (ref 70–99)
Glucose-Capillary: 123 mg/dL — ABNORMAL HIGH (ref 70–99)
Glucose-Capillary: 123 mg/dL — ABNORMAL HIGH (ref 70–99)

## 2018-01-09 LAB — BPAM FFP
BLOOD PRODUCT EXPIRATION DATE: 201908072359
Blood Product Expiration Date: 201908072359
ISSUE DATE / TIME: 201908021555
ISSUE DATE / TIME: 201908021708
UNIT TYPE AND RH: 7300
Unit Type and Rh: 7300

## 2018-01-09 LAB — BPAM CRYOPRECIPITATE
BLOOD PRODUCT EXPIRATION DATE: 201908021840
ISSUE DATE / TIME: 201908021313
Unit Type and Rh: 5100

## 2018-01-09 LAB — PREPARE CRYOPRECIPITATE: UNIT DIVISION: 0

## 2018-01-09 LAB — CREATININE, SERUM: CREATININE: 0.84 mg/dL (ref 0.61–1.24)

## 2018-01-09 LAB — POCT I-STAT, CHEM 8
BUN: 9 mg/dL (ref 6–20)
CHLORIDE: 98 mmol/L (ref 98–111)
Calcium, Ion: 1.14 mmol/L — ABNORMAL LOW (ref 1.15–1.40)
Creatinine, Ser: 0.7 mg/dL (ref 0.61–1.24)
GLUCOSE: 117 mg/dL — AB (ref 70–99)
HCT: 30 % — ABNORMAL LOW (ref 39.0–52.0)
Hemoglobin: 10.2 g/dL — ABNORMAL LOW (ref 13.0–17.0)
POTASSIUM: 4 mmol/L (ref 3.5–5.1)
Sodium: 136 mmol/L (ref 135–145)
TCO2: 23 mmol/L (ref 22–32)

## 2018-01-09 LAB — PREPARE FRESH FROZEN PLASMA: UNIT DIVISION: 0

## 2018-01-09 LAB — MAGNESIUM
Magnesium: 2.2 mg/dL (ref 1.7–2.4)
Magnesium: 2.2 mg/dL (ref 1.7–2.4)

## 2018-01-09 MED ORDER — DIGOXIN 125 MCG PO TABS
0.1250 mg | ORAL_TABLET | Freq: Every day | ORAL | Status: DC
  Administered 2018-01-09: 0.125 mg via ORAL
  Filled 2018-01-09: qty 1

## 2018-01-09 MED ORDER — METOLAZONE 2.5 MG PO TABS
2.5000 mg | ORAL_TABLET | Freq: Once | ORAL | Status: AC
  Administered 2018-01-09: 2.5 mg via ORAL
  Filled 2018-01-09: qty 1

## 2018-01-09 MED ORDER — FUROSEMIDE 10 MG/ML IJ SOLN
40.0000 mg | Freq: Once | INTRAMUSCULAR | Status: AC
  Administered 2018-01-09: 40 mg via INTRAVENOUS
  Filled 2018-01-09: qty 4

## 2018-01-09 MED ORDER — ALPRAZOLAM 0.5 MG PO TABS
0.5000 mg | ORAL_TABLET | Freq: Every evening | ORAL | Status: AC | PRN
  Administered 2018-01-09 (×2): 0.5 mg via ORAL
  Filled 2018-01-09 (×2): qty 1

## 2018-01-09 MED ORDER — DIGOXIN 0.25 MG/ML IJ SOLN
0.2500 mg | Freq: Once | INTRAMUSCULAR | Status: AC
  Administered 2018-01-09: 0.25 mg via INTRAVENOUS
  Filled 2018-01-09: qty 2

## 2018-01-09 MED ORDER — ENOXAPARIN SODIUM 40 MG/0.4ML ~~LOC~~ SOLN
40.0000 mg | Freq: Every day | SUBCUTANEOUS | Status: DC
  Administered 2018-01-09 – 2018-01-10 (×2): 40 mg via SUBCUTANEOUS
  Filled 2018-01-09 (×2): qty 0.4

## 2018-01-09 MED ORDER — POTASSIUM CHLORIDE CRYS ER 20 MEQ PO TBCR
40.0000 meq | EXTENDED_RELEASE_TABLET | Freq: Once | ORAL | Status: AC
  Administered 2018-01-09: 40 meq via ORAL
  Filled 2018-01-09: qty 2

## 2018-01-09 NOTE — Plan of Care (Signed)

## 2018-01-09 NOTE — Progress Notes (Signed)
1 Day Post-Op Procedure(s) (LRB): BENTALL PROCEDURE (N/A) ASCENDING AORTIC ROOT REPLACEMENT (N/A) TRANSESOPHAGEAL ECHOCARDIOGRAM (TEE) (N/A) CLIPPING OF ATRIAL APPENDAGE using Atricure Clip size 40 (N/A) Subjective: No complaints.   Objective: Vital signs in last 24 hours: Temp:  [98.8 F (37.1 C)-100.4 F (38 C)] 98.8 F (37.1 C) (08/03 0700) Pulse Rate:  [52-101] 101 (08/03 0630) Cardiac Rhythm: Atrial fibrillation (08/03 0700) Resp:  [10-27] 16 (08/03 0700) BP: (90-120)/(56-86) 94/67 (08/03 0630) SpO2:  [98 %-100 %] 100 % (08/03 0700) Arterial Line BP: (84-133)/(48-84) 104/48 (08/03 0700) FiO2 (%):  [40 %-50 %] 40 % (08/02 1900) Weight:  [128.6 kg (283 lb 8.2 oz)] 128.6 kg (283 lb 8.2 oz) (08/03 0520)  Hemodynamic parameters for last 24 hours: PAP: (18-33)/(8-20) 21/13 CO:  [3.2 L/min-4.8 L/min] 4.8 L/min CI:  [1.3 L/min/m2-2 L/min/m2] 2 L/min/m2  Intake/Output from previous day: 08/02 0701 - 08/03 0700 In: 10904.3 [P.O.:2040; I.V.:4545.3; Blood:1160; IV Piggyback:3159] Out: [id number] [Urine:4930; Blood:1475; Chest Tube:510] Intake/Output this shift: No intake/output data recorded.  General appearance: alert and cooperative Neurologic: intact Heart: irregularly irregular rhythm Lungs: clear to auscultation bilaterally Extremities: edema moderate Wound: dressing dry  Lab Results: Recent Labs    01/08/18 2108 01/08/18 2115 01/09/18 0431  WBC 10.3  --  9.8  HGB 11.7* 10.9* 10.2*  HCT 35.5* 32.0* 31.2*  PLT 141*  --  113*   BMET:  Recent Labs    01/06/18 1219  01/08/18 2115 01/09/18 0431  NA 139   < > 140 137  K 3.9   < > 4.1 4.0  CL 106   < > 108 107  CO2 24  --   --  25  GLUCOSE 91   < > 132* 118*  BUN 14   < > 14 12  CREATININE 1.12   < > 1.00 0.93  CALCIUM 9.1  --   --  7.5*   < > = values in this interval not displayed.    PT/INR:  Recent Labs    01/08/18 2108  LABPROT 16.9*  INR 1.39   ABG    Component Value Date/Time   PHART 7.344 (L)  01/08/2018 2127   HCO3 26.2 01/08/2018 2127   TCO2 28 01/08/2018 2127   ACIDBASEDEF 2.0 01/08/2018 2110   O2SAT 99.0 01/08/2018 2127   CBG (last 3)  Recent Labs    01/09/18 0435 01/09/18 0539 01/09/18 0633  GLUCAP 110* 108* 123*   CXR: ok  ECG: not done yet  Assessment/Plan: S/P Procedure(s) (LRB): BENTALL PROCEDURE (N/A) ASCENDING AORTIC ROOT REPLACEMENT (N/A) TRANSESOPHAGEAL ECHOCARDIOGRAM (TEE) (N/A) CLIPPING OF ATRIAL APPENDAGE using Atricure Clip size 40 (N/A)  POD 1 Hemodynamically stable on milrinone 0.2, dop 3, neo 40 mcg. Will wean milrinone, then neo, then dop. Chronic persistent atrial fib with controlled rate. Continue digoxin. Plan to resume Eliquis after pacing wires removed. Chronic LV systolic dysfunction with EF of 25-30% with normal filling pressures. Plan to resume Coreg and Entresto once off vasopressors. No hx of DM and normal A1c preop. Glucose under good control. Stop CBG's. Expected postop volume excess: diurese once off pressors.  Keep chest tubes in today IS, OOB, Ambulation   LOS: 1 day    Alleen BorneBryan K Oslo Huntsman 01/09/2018

## 2018-01-09 NOTE — Progress Notes (Signed)
Patient ID: Davonna BellingSamir M Longman, male   DOB: 08/04/1960, 57 y.o.   MRN: [id number] TCTS Evening Rounds:  Hemodynamically stable  Chronic AF with rate increased to 120's now. Will stop dopamine. Give an extra dose of digoxin and plan to resume Coreg in am.  Will start diuresis since off pressors.  BMET    Component Value Date/Time   NA 136 01/09/2018 1739   NA 141 12/15/2017 1352   K 4.0 01/09/2018 1739   CL 98 01/09/2018 1739   CO2 25 01/09/2018 0431   GLUCOSE 117 (H) 01/09/2018 1739   BUN 9 01/09/2018 1739   BUN 18 12/15/2017 1352   CREATININE 0.70 01/09/2018 1739   CALCIUM 7.5 (L) 01/09/2018 0431   GFRNONAA >60 01/09/2018 1727   GFRAA >60 01/09/2018 1727   CBC    Component Value Date/Time   WBC 11.2 (H) 01/09/2018 1727   RBC 3.61 (L) 01/09/2018 1727   HGB 10.2 (L) 01/09/2018 1739   HGB 16.4 12/15/2017 1352   HCT 30.0 (L) 01/09/2018 1739   HCT 49.8 12/15/2017 1352   PLT 102 (L) 01/09/2018 1727   PLT 236 12/15/2017 1352   MCV 88.9 01/09/2018 1727   MCV 87 12/15/2017 1352   MCH 28.8 01/09/2018 1727   MCHC 32.4 01/09/2018 1727   RDW 13.3 01/09/2018 1727   RDW 13.8 12/15/2017 1352   LYMPHSABS 2.0 01/24/2017 2120   MONOABS 0.5 01/24/2017 2120   EOSABS 0.3 01/24/2017 2120   BASOSABS 0.1 01/24/2017 2120

## 2018-01-10 ENCOUNTER — Inpatient Hospital Stay (HOSPITAL_COMMUNITY): Payer: Self-pay

## 2018-01-10 LAB — CBC
HCT: 32.9 % — ABNORMAL LOW (ref 39.0–52.0)
HEMOGLOBIN: 10.5 g/dL — AB (ref 13.0–17.0)
MCH: 28.5 pg (ref 26.0–34.0)
MCHC: 31.9 g/dL (ref 30.0–36.0)
MCV: 89.2 fL (ref 78.0–100.0)
Platelets: 106 10*3/uL — ABNORMAL LOW (ref 150–400)
RBC: 3.69 MIL/uL — ABNORMAL LOW (ref 4.22–5.81)
RDW: 13.5 % (ref 11.5–15.5)
WBC: 10.4 10*3/uL (ref 4.0–10.5)

## 2018-01-10 LAB — BASIC METABOLIC PANEL
Anion gap: 7 (ref 5–15)
BUN: 12 mg/dL (ref 6–20)
CALCIUM: 8.1 mg/dL — AB (ref 8.9–10.3)
CO2: 26 mmol/L (ref 22–32)
Chloride: 102 mmol/L (ref 98–111)
Creatinine, Ser: 0.9 mg/dL (ref 0.61–1.24)
GFR calc Af Amer: >60 mL/min (ref >60)
Glucose, Bld: 107 mg/dL — ABNORMAL HIGH (ref 70–99)
POTASSIUM: 3.9 mmol/L (ref 3.5–5.1)
SODIUM: 135 mmol/L (ref 135–145)

## 2018-01-10 MED ORDER — POTASSIUM CHLORIDE CRYS ER 20 MEQ PO TBCR
20.0000 meq | EXTENDED_RELEASE_TABLET | Freq: Three times a day (TID) | ORAL | Status: AC
  Administered 2018-01-10 (×3): 20 meq via ORAL
  Filled 2018-01-10 (×3): qty 1

## 2018-01-10 MED ORDER — CARVEDILOL 6.25 MG PO TABS
6.2500 mg | ORAL_TABLET | Freq: Two times a day (BID) | ORAL | Status: DC
  Administered 2018-01-10 (×2): 6.25 mg via ORAL
  Filled 2018-01-10 (×2): qty 1

## 2018-01-10 MED ORDER — FUROSEMIDE 10 MG/ML IJ SOLN
40.0000 mg | Freq: Once | INTRAMUSCULAR | Status: AC
  Administered 2018-01-10: 40 mg via INTRAVENOUS
  Filled 2018-01-10: qty 4

## 2018-01-10 MED ORDER — ALPRAZOLAM 0.5 MG PO TABS
0.5000 mg | ORAL_TABLET | Freq: Every evening | ORAL | Status: DC | PRN
  Administered 2018-01-10: 0.5 mg via ORAL
  Filled 2018-01-10: qty 1

## 2018-01-10 MED ORDER — DIGOXIN 125 MCG PO TABS
0.2500 mg | ORAL_TABLET | Freq: Every day | ORAL | Status: DC
  Administered 2018-01-10: 0.25 mg via ORAL
  Filled 2018-01-10: qty 2

## 2018-01-10 MED ORDER — METOLAZONE 2.5 MG PO TABS
2.5000 mg | ORAL_TABLET | Freq: Once | ORAL | Status: AC
  Administered 2018-01-10: 2.5 mg via ORAL
  Filled 2018-01-10: qty 1

## 2018-01-10 NOTE — Progress Notes (Signed)
Patient ID: Caleb BellingSamir M Cassidy, male   DOB: 04/14/1961, 57 y.o.   MRN: [id number] TCTS Evening Rounds:  Hemodynamically stable today. Chronic atrial fib with vent rate 110-120's on digoxin and Coreg. HR was 50's preop on these but his Hgb was 15 preop. Suspect this will come down with time.  Diuresed well today. Replacing K+  Ambulating better.

## 2018-01-10 NOTE — Progress Notes (Signed)
2 Days Post-Op Procedure(s) (LRB): BENTALL PROCEDURE (N/A) ASCENDING AORTIC ROOT REPLACEMENT (N/A) TRANSESOPHAGEAL ECHOCARDIOGRAM (TEE) (N/A) CLIPPING OF ATRIAL APPENDAGE using Atricure Clip size 40 (N/A) Subjective: Very tired from not sleeping much. He received 1 mg Xanax overnight and says he did sleep some.  Objective: Vital signs in last 24 hours: Temp:  [97.9 F (36.6 C)-99.1 F (37.3 C)] 98.1 F (36.7 C) (08/04 0400) Pulse Rate:  [61-145] 69 (08/04 0700) Cardiac Rhythm: Atrial fibrillation (08/04 0000) Resp:  [12-37] 22 (08/04 0700) BP: (89-125)/(59-92) 110/92 (08/04 0700) SpO2:  [91 %-100 %] 94 % (08/04 0700) Arterial Line BP: (91-138)/(44-76) 122/70 (08/04 0520) Weight:  [126.6 kg (279 lb)] 126.6 kg (279 lb) (08/04 0600)  Hemodynamic parameters for last 24 hours: PAP: (20-28)/(12-19) 20/12  Intake/Output from previous day: 08/03 0701 - 08/04 0700 In: 1570.7 [P.O.:840; I.V.:430.6; IV Piggyback:300.1] Out: 3085 [Urine:2615; Chest Tube:470] Intake/Output this shift: No intake/output data recorded.  General appearance: alert and cooperative Neurologic: intact Heart: regular rate and rhythm, S1, S2 normal, no murmur, click, rub or gallop Lungs: clear to auscultation bilaterally Extremities: edema mild Wound: dressing dry  Lab Results: Recent Labs    01/09/18 1727 01/09/18 1739 01/10/18 0454  WBC 11.2*  --  10.4  HGB 10.4* 10.2* 10.5*  HCT 32.1* 30.0* 32.9*  PLT 102*  --  106*   BMET:  Recent Labs    01/09/18 0431  01/09/18 1739 01/10/18 0454  NA 137  --  136 135  K 4.0  --  4.0 3.9  CL 107  --  98 102  CO2 25  --   --  26  GLUCOSE 118*  --  117* 107*  BUN 12  --  9 12  CREATININE 0.93   < > 0.70 0.90  CALCIUM 7.5*  --   --  8.1*   < > = values in this interval not displayed.    PT/INR:  Recent Labs    01/08/18 2108  LABPROT 16.9*  INR 1.39   ABG    Component Value Date/Time   PHART 7.344 (L) 01/08/2018 2127   HCO3 26.2 01/08/2018 2127    TCO2 23 01/09/2018 1739   ACIDBASEDEF 2.0 01/08/2018 2110   O2SAT 99.0 01/08/2018 2127   CBG (last 3)  Recent Labs    01/09/18 0539 01/09/18 0633 01/09/18 0737  GLUCAP 108* 123* 115*   CXR: ok  Assessment/Plan: S/P Procedure(s) (LRB): BENTALL PROCEDURE (N/A) ASCENDING AORTIC ROOT REPLACEMENT (N/A) TRANSESOPHAGEAL ECHOCARDIOGRAM (TEE) (N/A) CLIPPING OF ATRIAL APPENDAGE using Atricure Clip size 40 (N/A)  POD 2  Hemodynamically stable in chronic atrial fibrillation. EF 25-30 preop. Rate is 120-130's. Continue digoxin but increase dose to 0.25 daily. Resume Coreg 6.25 bid. Check digoxin level tomorrow. Plan to resume Eliquis after pacing wires are out. LAA clipped.  Volume excess: diuresed well last night and weight down 4.5 lbs today. Will continue diuresis today. Replace K+  DC chest tubes  IS, ambulation   LOS: 2 days    Caleb [name] 01/10/2018

## 2018-01-11 ENCOUNTER — Inpatient Hospital Stay (HOSPITAL_COMMUNITY): Payer: Self-pay

## 2018-01-11 ENCOUNTER — Encounter (HOSPITAL_COMMUNITY): Payer: Self-pay | Admitting: Surgery

## 2018-01-11 LAB — BASIC METABOLIC PANEL
Anion gap: 11 (ref 5–15)
BUN: 13 mg/dL (ref 6–20)
CO2: 28 mmol/L (ref 22–32)
CREATININE: 1.03 mg/dL (ref 0.61–1.24)
Calcium: 8.3 mg/dL — ABNORMAL LOW (ref 8.9–10.3)
Chloride: 95 mmol/L — ABNORMAL LOW (ref 98–111)
GFR calc Af Amer: >60 mL/min (ref >60)
GFR calc non Af Amer: >60 mL/min (ref >60)
Glucose, Bld: 100 mg/dL — ABNORMAL HIGH (ref 70–99)
Potassium: 3.7 mmol/L (ref 3.5–5.1)
SODIUM: 134 mmol/L — AB (ref 135–145)

## 2018-01-11 LAB — POCT I-STAT 4, (NA,K, GLUC, HGB,HCT)
Glucose, Bld: 157 mg/dL — ABNORMAL HIGH (ref 70–99)
HEMATOCRIT: 37 % — AB (ref 39.0–52.0)
Hemoglobin: 12.6 g/dL — ABNORMAL LOW (ref 13.0–17.0)
Potassium: 4.2 mmol/L (ref 3.5–5.1)
Sodium: 141 mmol/L (ref 135–145)

## 2018-01-11 LAB — DIGOXIN LEVEL

## 2018-01-11 MED ORDER — ENOXAPARIN SODIUM 40 MG/0.4ML ~~LOC~~ SOLN
40.0000 mg | SUBCUTANEOUS | Status: DC
  Administered 2018-01-12: 40 mg via SUBCUTANEOUS
  Filled 2018-01-11 (×2): qty 0.4

## 2018-01-11 MED ORDER — CARVEDILOL 6.25 MG PO TABS
6.2500 mg | ORAL_TABLET | Freq: Two times a day (BID) | ORAL | Status: DC
Start: 2018-01-12 — End: 2018-01-12

## 2018-01-11 MED ORDER — ASPIRIN EC 325 MG PO TBEC
325.0000 mg | DELAYED_RELEASE_TABLET | Freq: Every day | ORAL | Status: DC
  Administered 2018-01-12: 325 mg via ORAL
  Filled 2018-01-11: qty 1

## 2018-01-11 MED ORDER — POTASSIUM CHLORIDE CRYS ER 20 MEQ PO TBCR
20.0000 meq | EXTENDED_RELEASE_TABLET | ORAL | Status: AC
  Administered 2018-01-11 (×2): 20 meq via ORAL
  Filled 2018-01-11 (×2): qty 1

## 2018-01-11 MED ORDER — ONDANSETRON HCL 4 MG PO TABS: 4.0000 mg | ORAL_TABLET | Freq: Four times a day (QID) | ORAL | Status: DC | PRN

## 2018-01-11 MED ORDER — ACETAMINOPHEN 325 MG PO TABS
650.0000 mg | ORAL_TABLET | Freq: Four times a day (QID) | ORAL | Status: DC | PRN
  Administered 2018-01-12 – 2018-01-17 (×5): 650 mg via ORAL
  Filled 2018-01-11 (×5): qty 2

## 2018-01-11 MED ORDER — BISACODYL 5 MG PO TBEC
10.0000 mg | DELAYED_RELEASE_TABLET | Freq: Every day | ORAL | Status: DC | PRN
  Administered 2018-01-12: 10 mg via ORAL
  Filled 2018-01-11: qty 2

## 2018-01-11 MED ORDER — POTASSIUM CHLORIDE CRYS ER 20 MEQ PO TBCR
20.0000 meq | EXTENDED_RELEASE_TABLET | ORAL | Status: DC | PRN
  Administered 2018-01-11: 20 meq via ORAL
  Filled 2018-01-11: qty 1

## 2018-01-11 MED ORDER — GUAIFENESIN ER 600 MG PO TB12
600.0000 mg | ORAL_TABLET | Freq: Two times a day (BID) | ORAL | Status: DC
  Administered 2018-01-11 – 2018-01-18 (×13): 600 mg via ORAL
  Filled 2018-01-11 (×14): qty 1

## 2018-01-11 MED ORDER — BISACODYL 10 MG RE SUPP: 10.0000 mg | Freq: Every day | RECTAL | Status: DC | PRN

## 2018-01-11 MED ORDER — ONDANSETRON HCL 4 MG/2ML IJ SOLN: 4.0000 mg | Freq: Four times a day (QID) | INTRAMUSCULAR | Status: DC | PRN

## 2018-01-11 MED ORDER — MIDODRINE HCL 5 MG PO TABS
10.0000 mg | ORAL_TABLET | Freq: Three times a day (TID) | ORAL | Status: DC
  Administered 2018-01-11 – 2018-01-14 (×10): 10 mg via ORAL
  Filled 2018-01-11 (×12): qty 2

## 2018-01-11 MED ORDER — ALPRAZOLAM 0.5 MG PO TABS
0.5000 mg | ORAL_TABLET | Freq: Every evening | ORAL | Status: DC | PRN
  Administered 2018-01-11 – 2018-01-17 (×7): 0.5 mg via ORAL
  Filled 2018-01-11 (×7): qty 1

## 2018-01-11 MED ORDER — SODIUM CHLORIDE 0.9% FLUSH
3.0000 mL | INTRAVENOUS | Status: DC | PRN
  Administered 2018-01-11: 3 mL via INTRAVENOUS
  Filled 2018-01-11: qty 3

## 2018-01-11 MED ORDER — DOCUSATE SODIUM 100 MG PO CAPS
200.0000 mg | ORAL_CAPSULE | Freq: Every day | ORAL | Status: DC
Start: 2018-01-11 — End: 2018-01-18
  Administered 2018-01-12 – 2018-01-18 (×5): 200 mg via ORAL
  Filled 2018-01-11 (×7): qty 2

## 2018-01-11 MED ORDER — MOVING RIGHT ALONG BOOK
Freq: Once | Status: AC
  Filled 2018-01-11: qty 1

## 2018-01-11 MED ORDER — DIGOXIN 125 MCG PO TABS
0.2500 mg | ORAL_TABLET | Freq: Every day | ORAL | Status: DC
  Administered 2018-01-12: 0.25 mg via ORAL
  Filled 2018-01-11: qty 2

## 2018-01-11 MED ORDER — TRAMADOL HCL 50 MG PO TABS
50.0000 mg | ORAL_TABLET | Freq: Four times a day (QID) | ORAL | Status: DC | PRN
  Administered 2018-01-14: 100 mg via ORAL
  Filled 2018-01-11: qty 2

## 2018-01-11 MED ORDER — CARVEDILOL 3.125 MG PO TABS
3.1250 mg | ORAL_TABLET | Freq: Two times a day (BID) | ORAL | Status: DC
  Administered 2018-01-11 (×2): 3.125 mg via ORAL
  Filled 2018-01-11 (×2): qty 1

## 2018-01-11 MED ORDER — OXYCODONE HCL 5 MG PO TABS
5.0000 mg | ORAL_TABLET | ORAL | Status: DC | PRN
  Administered 2018-01-13 – 2018-01-17 (×6): 10 mg via ORAL
  Filled 2018-01-11 (×6): qty 2

## 2018-01-11 MED ORDER — SODIUM CHLORIDE 0.9 % IV SOLN: 250.0000 mL | INTRAVENOUS | Status: DC | PRN

## 2018-01-11 MED ORDER — SODIUM CHLORIDE 0.9% FLUSH
3.0000 mL | Freq: Two times a day (BID) | INTRAVENOUS | Status: DC
  Administered 2018-01-12 – 2018-01-16 (×9): 3 mL via INTRAVENOUS

## 2018-01-11 MED ORDER — DIGOXIN 0.25 MG/ML IJ SOLN
0.5000 mg | Freq: Once | INTRAMUSCULAR | Status: AC
  Administered 2018-01-11: 0.5 mg via INTRAVENOUS
  Filled 2018-01-11: qty 2

## 2018-01-11 MED ORDER — PANTOPRAZOLE SODIUM 40 MG PO TBEC
40.0000 mg | DELAYED_RELEASE_TABLET | Freq: Every day | ORAL | Status: DC
  Administered 2018-01-12 – 2018-01-18 (×7): 40 mg via ORAL
  Filled 2018-01-11 (×7): qty 1

## 2018-01-11 MED FILL — Heparin Sodium (Porcine) Inj 1000 Unit/ML: INTRAMUSCULAR | Qty: 30 | Status: AC

## 2018-01-11 MED FILL — Heparin Sodium (Porcine) Inj 1000 Unit/ML: INTRAMUSCULAR | Qty: 2500 | Status: AC

## 2018-01-11 MED FILL — Potassium Chloride Inj 2 mEq/ML: INTRAVENOUS | Qty: 40 | Status: AC

## 2018-01-11 MED FILL — Magnesium Sulfate Inj 50%: INTRAMUSCULAR | Qty: 10 | Status: AC

## 2018-01-11 NOTE — Progress Notes (Signed)
3 Days Post-Op Procedure(s) (LRB): BENTALL PROCEDURE (N/A) ASCENDING AORTIC ROOT REPLACEMENT (N/A) TRANSESOPHAGEAL ECHOCARDIOGRAM (TEE) (N/A) CLIPPING OF ATRIAL APPENDAGE using Atricure Clip size 40 (N/A) Subjective: Only complaint is that he can't sleep and therefore tired. He did ambulate around the ICU this am.  Objective: Vital signs in last 24 hours: Temp:  [98.7 F (37.1 C)-100.3 F (37.9 C)] 99.7 F (37.6 C) (08/05 0015) Pulse Rate:  [51-137] 137 (08/05 0530) Cardiac Rhythm: Atrial fibrillation (08/04 2000) Resp:  [15-27] 16 (08/05 0600) BP: (82-115)/(50-81) 91/67 (08/05 0600) SpO2:  [84 %-99 %] 94 % (08/05 0600) Weight:  [122 kg (269 lb)] 122 kg (269 lb) (08/05 0550)  Hemodynamic parameters for last 24 hours:    Intake/Output from previous day: 08/04 0701 - 08/05 0700 In: 1800 [P.O.:1800] Out: 6650 [Urine:6650] Intake/Output this shift: No intake/output data recorded.  General appearance: alert and cooperative Neurologic: intact Heart: irregularly irregular rhythm Lungs: clear to auscultation bilaterally Extremities: extremities normal, atraumatic, no cyanosis or edema Wound: incision ok  Lab Results: Recent Labs    01/09/18 1727 01/09/18 1739 01/10/18 0454  WBC 11.2*  --  10.4  HGB 10.4* 10.2* 10.5*  HCT 32.1* 30.0* 32.9*  PLT 102*  --  106*   BMET:  Recent Labs    01/10/18 0454 01/11/18 0251  NA 135 134*  K 3.9 3.7  CL 102 95*  CO2 26 28  GLUCOSE 107* 100*  BUN 12 13  CREATININE 0.90 1.03  CALCIUM 8.1* 8.3*    PT/INR:  Recent Labs    01/08/18 2108  LABPROT 16.9*  INR 1.39   ABG    Component Value Date/Time   PHART 7.344 (L) 01/08/2018 2127   HCO3 26.2 01/08/2018 2127   TCO2 23 01/09/2018 1739   ACIDBASEDEF 2.0 01/08/2018 2110   O2SAT 99.0 01/08/2018 2127   CBG (last 3)  Recent Labs    01/09/18 0539 01/09/18 0633 01/09/18 0737  GLUCAP 108* 123* 115*   CXR: ok  Assessment/Plan: S/P Procedure(s) (LRB): BENTALL PROCEDURE  (N/A) ASCENDING AORTIC ROOT REPLACEMENT (N/A) TRANSESOPHAGEAL ECHOCARDIOGRAM (TEE) (N/A) CLIPPING OF ATRIAL APPENDAGE using Atricure Clip size 40 (N/A)  POD 3  His BP is borderline and ventricular rate in chronic atrial fib is still 120's to 140's. Digoxin level was low at < 0.2. Will give him 0.5 of digoxin IV this am instead of po dose and resume po tomorrow. With borderline BP will have to decrease Coreg to 3.125 for now. Will start some midodrine for a few days to support BP until things equilibrate. His EF is low and with borderline BP can't use cardizem. His preop HR was in the 60's in atrial fib so I think he just needs to be blocked down further.  Diuresed well yesterday. Will hold off on any further diuretic at this time.  Continue IS and ambulation.   LOS: 3 days    Alleen BorneBryan K Bartle 01/11/2018

## 2018-01-11 NOTE — Plan of Care (Signed)
  Problem: Education: Goal: Knowledge of General Education information will improve Description Including pain rating scale, medication(s)/side effects and non-pharmacologic comfort measures Outcome: Progressing   Problem: Health Behavior/Discharge Planning: Goal: Ability to manage health-related needs will improve Outcome: Progressing   Problem: Clinical Measurements: Goal: Ability to maintain clinical measurements within normal limits will improve Outcome: Progressing Goal: Will remain free from infection Outcome: Progressing Goal: Diagnostic test results will improve Outcome: Progressing Goal: Respiratory complications will improve Outcome: Progressing Goal: Cardiovascular complication will be avoided Outcome: Progressing   Problem: Activity: Goal: Risk for activity intolerance will decrease Outcome: Progressing   Problem: Nutrition: Goal: Adequate nutrition will be maintained Outcome: Progressing   Problem: Coping: Goal: Level of anxiety will decrease Outcome: Progressing   Problem: Elimination: Goal: Will not experience complications related to bowel motility Outcome: Progressing Goal: Will not experience complications related to urinary retention Outcome: Progressing   Problem: Pain Managment: Goal: General experience of comfort will improve Outcome: Progressing   Problem: Safety: Goal: Ability to remain free from injury will improve Outcome: Progressing   Problem: Skin Integrity: Goal: Risk for impaired skin integrity will decrease Outcome: Progressing   Problem: Education: Goal: Knowledge of disease or condition will improve Outcome: Progressing Goal: Knowledge of the prescribed therapeutic regimen will improve Outcome: Progressing   Problem: Activity: Goal: Risk for activity intolerance will decrease Outcome: Progressing   Problem: Cardiac: Goal: Will achieve and/or maintain hemodynamic stability Outcome: Progressing   Problem: Clinical  Measurements: Goal: Postoperative complications will be avoided or minimized Outcome: Progressing   Problem: Respiratory: Goal: Respiratory status will improve Outcome: Progressing   Problem: Skin Integrity: Goal: Wound healing without signs and symptoms of infection Outcome: Progressing Goal: Risk for impaired skin integrity will decrease Outcome: Progressing   Problem: Urinary Elimination: Goal: Ability to achieve and maintain adequate renal perfusion and functioning will improve Outcome: Progressing   

## 2018-01-11 NOTE — Progress Notes (Signed)
CT surgery p.m. Rounds  Patient out of bed, ambulates Low-grade fever this p.m. with wet cough Mucinex started Chest x-ray in a.m.

## 2018-01-12 ENCOUNTER — Inpatient Hospital Stay (HOSPITAL_COMMUNITY): Payer: Self-pay

## 2018-01-12 LAB — BASIC METABOLIC PANEL
Anion gap: 11 (ref 5–15)
BUN: 12 mg/dL (ref 6–20)
CO2: 30 mmol/L (ref 22–32)
Calcium: 8.6 mg/dL — ABNORMAL LOW (ref 8.9–10.3)
Chloride: 94 mmol/L — ABNORMAL LOW (ref 98–111)
Creatinine, Ser: 1.12 mg/dL (ref 0.61–1.24)
GFR calc Af Amer: >60 mL/min (ref >60)
GFR calc non Af Amer: >60 mL/min (ref >60)
Glucose, Bld: 107 mg/dL — ABNORMAL HIGH (ref 70–99)
Potassium: 3.8 mmol/L (ref 3.5–5.1)
Sodium: 135 mmol/L (ref 135–145)

## 2018-01-12 LAB — CBC
HCT: 30.3 % — ABNORMAL LOW (ref 39.0–52.0)
Hemoglobin: 9.7 g/dL — ABNORMAL LOW (ref 13.0–17.0)
MCH: 28.3 pg (ref 26.0–34.0)
MCHC: 32 g/dL (ref 30.0–36.0)
MCV: 88.3 fL (ref 78.0–100.0)
Platelets: 161 10*3/uL (ref 150–400)
RBC: 3.43 MIL/uL — ABNORMAL LOW (ref 4.22–5.81)
RDW: 13.4 % (ref 11.5–15.5)
WBC: 10.8 10*3/uL — ABNORMAL HIGH (ref 4.0–10.5)

## 2018-01-12 MED ORDER — POTASSIUM CHLORIDE CRYS ER 20 MEQ PO TBCR
20.0000 meq | EXTENDED_RELEASE_TABLET | Freq: Two times a day (BID) | ORAL | Status: AC
  Administered 2018-01-12 (×2): 20 meq via ORAL
  Filled 2018-01-12 (×2): qty 1

## 2018-01-12 MED ORDER — POTASSIUM CHLORIDE CRYS ER 20 MEQ PO TBCR: 20.0000 meq | EXTENDED_RELEASE_TABLET | Freq: Two times a day (BID) | ORAL | Status: DC

## 2018-01-12 MED ORDER — METOPROLOL SUCCINATE ER 50 MG PO TB24
50.0000 mg | ORAL_TABLET | Freq: Every day | ORAL | Status: DC
  Administered 2018-01-12: 50 mg via ORAL
  Filled 2018-01-12: qty 1

## 2018-01-12 NOTE — Care Management Note (Signed)
Case Management Note  Patient Details  Name: Caleb [name] MRN: [id number] Date of Birth: 06/10/1960  Subjective/Objective:       Pt is s.p Bentall procedure              Action/Plan:  PTA independent from home , sister will help him at discharge.  Pt confirmed he is active with IM clinic and [facility].  Pt also confirmed he is active with [employer] assistance.     Expected Discharge Date:                  Expected Discharge Plan:  Home/Self Care  In-House Referral:     Discharge planning Services  CM Consult  Post Acute Care Choice:    Choice offered to:     DME Arranged:    DME Agency:     HH Arranged:    HH Agency:     Status of Service:  In process, will continue to follow  If discussed at Long Length of Stay Meetings, dates discussed:    Additional Comments:  Cherylann ParrClaxton, Anndrea Mihelich S, RN 01/12/2018, 4:24 PM

## 2018-01-12 NOTE — Progress Notes (Signed)
01/12/2018 1740 Pt arrived to floor via wheelchair with staff. Pt oriented to room and given call bell. Pt given CHG bath upon arrival to the unit. Tele applied and CCMD notified.  Caleb KindsMichala N Evelyn Aguinaldo, RN

## 2018-01-12 NOTE — Progress Notes (Signed)
Report called to Caleb [name] on 4E. Patient going to room 04. All belongings are accounted for. No further questions at this time.

## 2018-01-12 NOTE — Progress Notes (Signed)
4 Days Post-Op Procedure(s) (LRB): BENTALL PROCEDURE (N/A) ASCENDING AORTIC ROOT REPLACEMENT (N/A) TRANSESOPHAGEAL ECHOCARDIOGRAM (TEE) (N/A) CLIPPING OF ATRIAL APPENDAGE using Atricure Clip size 40 (N/A) Subjective: No complaints. Slept better. Ambulated this am.  Objective: Vital signs in last 24 hours: Temp:  [97.7 F (36.5 C)-100 F (37.8 C)] 97.8 F (36.6 C) (08/06 0332) Pulse Rate:  [80-136] 120 (08/06 0700) Cardiac Rhythm: Atrial fibrillation (08/05 2000) Resp:  [0-26] 26 (08/06 0700) BP: (75-117)/(51-99) 117/99 (08/06 0700) SpO2:  [79 %-96 %] 79 % (08/06 0700) Weight:  [119.6 kg (263 lb 10.7 oz)] 119.6 kg (263 lb 10.7 oz) (08/06 0600)  Hemodynamic parameters for last 24 hours:    Intake/Output from previous day: 08/05 0701 - 08/06 0700 In: 837 [P.O.:600] Out: 3202 [Urine:3200; Stool:2] Intake/Output this shift: No intake/output data recorded.  General appearance: alert and cooperative Neurologic: intact Heart: irregularly irregular rhythm Lungs: clear to auscultation bilaterally Extremities: extremities normal, atraumatic, no cyanosis or edema Wound: incision ok  Lab Results: Recent Labs    01/10/18 0454 01/12/18 0233  WBC 10.4 10.8*  HGB 10.5* 9.7*  HCT 32.9* 30.3*  PLT 106* 161   BMET:  Recent Labs    01/11/18 0251 01/12/18 0233  NA 134* 135  K 3.7 3.8  CL 95* 94*  CO2 28 30  GLUCOSE 100* 107*  BUN 13 12  CREATININE 1.03 1.12  CALCIUM 8.3* 8.6*    PT/INR: No results for input(s): LABPROT, INR in the last 72 hours. ABG    Component Value Date/Time   PHART 7.344 (L) 01/08/2018 2127   HCO3 26.2 01/08/2018 2127   TCO2 23 01/09/2018 1739   ACIDBASEDEF 2.0 01/08/2018 2110   O2SAT 99.0 01/08/2018 2127   CBG (last 3)  No results for input(s): GLUCAP in the last 72 hours.  CXR: ok  Assessment/Plan: S/P Procedure(s) (LRB): BENTALL PROCEDURE (N/A) ASCENDING AORTIC ROOT REPLACEMENT (N/A) TRANSESOPHAGEAL ECHOCARDIOGRAM (TEE)  (N/A) CLIPPING OF ATRIAL APPENDAGE using Atricure Clip size 40 (N/A)  POD 4 Hemodynamics stable with better BP on midodrine. Will continue for now.  Chronic atrial fib with RVR that has been difficult to block down. Will continue digoxin and check level tomorrow. Will switch Coreg to Toprol XL which I think may be more effective for blocking rate with his lower BP.  He has continued to diurese without lasix. Replacing K+  Continue IS and ambulation.  Remove pacing wires today and plan to resume Eliquis prior to DC. He needs to have good rate control prior to discharge.    LOS: 4 days    Alleen BorneBryan K Lanett Lasorsa 01/12/2018

## 2018-01-12 NOTE — Plan of Care (Signed)
Patient continues to slowly progress. He is able to participate in his care but must be strongly encouraged to do so. He seems to prefer to be awake at night and then sleep for the better part of the day so it has been difficult to keep him awake to keep him engaged in  his care. RN has been working with him to try to get him back to a more normal routine but patient is resistant. Patient is able to walk around the entire unit with encouragement as well as ambulate independently in his room. He is steady on his feet and is able to get up and down very well with little assistance. He does not have a good appetite but will eat foods brought from home, he prefers to not eat meat stating that they can be too dry and hard for him to swallow since his stroke. Family has been present and is very encouraging and supportive.

## 2018-01-13 DIAGNOSIS — Z9889 Other specified postprocedural states: Secondary | ICD-10-CM

## 2018-01-13 DIAGNOSIS — I482 Chronic atrial fibrillation: Secondary | ICD-10-CM

## 2018-01-13 DIAGNOSIS — Z8679 Personal history of other diseases of the circulatory system: Secondary | ICD-10-CM

## 2018-01-13 LAB — CBC
HCT: 29.6 % — ABNORMAL LOW (ref 39.0–52.0)
Hemoglobin: 9.6 g/dL — ABNORMAL LOW (ref 13.0–17.0)
MCH: 28.7 pg (ref 26.0–34.0)
MCHC: 32.4 g/dL (ref 30.0–36.0)
MCV: 88.6 fL (ref 78.0–100.0)
Platelets: 210 10*3/uL (ref 150–400)
RBC: 3.34 MIL/uL — AB (ref 4.22–5.81)
RDW: 13.5 % (ref 11.5–15.5)
WBC: 10.2 10*3/uL (ref 4.0–10.5)

## 2018-01-13 LAB — DIGOXIN LEVEL: Digoxin Level: 0.7 ng/mL — ABNORMAL LOW (ref 0.8–2.0)

## 2018-01-13 MED ORDER — DIGOXIN 125 MCG PO TABS
0.2500 mg | ORAL_TABLET | Freq: Every day | ORAL | Status: DC
  Administered 2018-01-14: 0.25 mg via ORAL
  Filled 2018-01-13: qty 2

## 2018-01-13 MED ORDER — METOPROLOL SUCCINATE ER 100 MG PO TB24
100.0000 mg | ORAL_TABLET | Freq: Every day | ORAL | Status: DC
  Administered 2018-01-13 – 2018-01-15 (×3): 100 mg via ORAL
  Filled 2018-01-13 (×3): qty 1

## 2018-01-13 MED ORDER — ASPIRIN EC 81 MG PO TBEC
81.0000 mg | DELAYED_RELEASE_TABLET | Freq: Every day | ORAL | Status: DC
  Administered 2018-01-13 – 2018-01-18 (×6): 81 mg via ORAL
  Filled 2018-01-13 (×6): qty 1

## 2018-01-13 MED ORDER — DIGOXIN 125 MCG PO TABS
0.5000 mg | ORAL_TABLET | Freq: Once | ORAL | Status: AC
  Administered 2018-01-13: 0.5 mg via ORAL
  Filled 2018-01-13: qty 4

## 2018-01-13 MED ORDER — APIXABAN 5 MG PO TABS
5.0000 mg | ORAL_TABLET | Freq: Two times a day (BID) | ORAL | Status: DC
  Administered 2018-01-13 – 2018-01-14 (×3): 5 mg via ORAL
  Filled 2018-01-13 (×3): qty 1

## 2018-01-13 MED FILL — Electrolyte-R (PH 7.4) Solution: INTRAVENOUS | Qty: 5000 | Status: AC

## 2018-01-13 MED FILL — Sodium Bicarbonate IV Soln 8.4%: INTRAVENOUS | Qty: 50 | Status: AC

## 2018-01-13 MED FILL — Albumin, Human Inj 5%: INTRAVENOUS | Qty: 250 | Status: AC

## 2018-01-13 MED FILL — Lidocaine HCl(Cardiac) IV PF Soln Pref Syr 100 MG/5ML (2%): INTRAVENOUS | Qty: 5 | Status: AC

## 2018-01-13 MED FILL — Sodium Chloride IV Soln 0.9%: INTRAVENOUS | Qty: 2000 | Status: AC

## 2018-01-13 MED FILL — Mannitol IV Soln 20%: INTRAVENOUS | Qty: 500 | Status: AC

## 2018-01-13 MED FILL — Heparin Sodium (Porcine) Inj 1000 Unit/ML: INTRAMUSCULAR | Qty: 10 | Status: AC
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
## 2018-01-13 NOTE — Progress Notes (Signed)
5 Days Post-Op Procedure(s) (LRB): BENTALL PROCEDURE (N/A) ASCENDING AORTIC ROOT REPLACEMENT (N/A) TRANSESOPHAGEAL ECHOCARDIOGRAM (TEE) (N/A) CLIPPING OF ATRIAL APPENDAGE using Atricure Clip size 40 (N/A) Subjective:  Feels better, slept better. Bowels working Ambulating well  Objective: Vital signs in last 24 hours: Temp:  [98.3 F (36.8 C)-101.6 F (38.7 C)] 98.9 F (37.2 C) (08/07 0534) Pulse Rate:  [38-103] 38 (08/07 0534) Cardiac Rhythm: Atrial fibrillation (08/07 0725) Resp:  [6-24] 22 (08/07 0534) BP: (97-131)/(52-111) 101/87 (08/07 0534) SpO2:  [87 %-99 %] 97 % (08/07 0534) Weight:  [120 kg (264 lb 9.6 oz)] 120 kg (264 lb 9.6 oz) (08/07 0343)  Hemodynamic parameters for last 24 hours:    Intake/Output from previous day: 08/06 0701 - 08/07 0700 In: 1080 [P.O.:1080] Out: 2475 [Urine:2475] Intake/Output this shift: No intake/output data recorded.  General appearance: alert and cooperative Neurologic: intact Heart: irregular rate and rhythm, tachy, S1, S2 normal, no murmur, click, rub or gallop Lungs: clear to auscultation bilaterally Extremities: extremities normal, atraumatic, no cyanosis or edema Wound: incision ok Saline well site looks ok  Lab Results: Recent Labs    01/12/18 0233 01/13/18 0302  WBC 10.8* 10.2  HGB 9.7* 9.6*  HCT 30.3* 29.6*  PLT 161 210   BMET:  Recent Labs    01/11/18 0251 01/12/18 0233  NA 134* 135  K 3.7 3.8  CL 95* 94*  CO2 28 30  GLUCOSE 100* 107*  BUN 13 12  CREATININE 1.03 1.12  CALCIUM 8.3* 8.6*    PT/INR: No results for input(s): LABPROT, INR in the last 72 hours. ABG    Component Value Date/Time   PHART 7.344 (L) 01/08/2018 2127   HCO3 26.2 01/08/2018 2127   TCO2 23 01/09/2018 1739   ACIDBASEDEF 2.0 01/08/2018 2110   O2SAT 99.0 01/08/2018 2127   CBG (last 3)  No results for input(s): GLUCAP in the last 72 hours.  Assessment/Plan: S/P Procedure(s) (LRB): BENTALL PROCEDURE (N/A) ASCENDING AORTIC ROOT  REPLACEMENT (N/A) TRANSESOPHAGEAL ECHOCARDIOGRAM (TEE) (N/A) CLIPPING OF ATRIAL APPENDAGE using Atricure Clip size 40 (N/A)  POD 5 He is hemodynamically stable with SBP low 100's on midodrine and Toprol 50 for HR control. His rate in atrial fib has been difficult to control and still 120's to 150's. Digoxin level was 0.7 so will give 0.5 mg today and continue 0.25 tomorrow. Will try to increase Toprol to 100 and see how his BP tolerates it. Will ask EP to see him.  Fever to 101.6 last pm but say he felt fine. I don't see any source except possibly atelectasis, could have some pericarditis but no rub. IV site looks ok. WBC ct normal. Encourage IS and follow.  Will resume Eliquis today for atrial fib.     LOS: 5 days    Alleen BorneBryan K Bartle 01/13/2018

## 2018-01-13 NOTE — Progress Notes (Signed)
Spoke with Dr. Laneta SimmersBartle  And he was made aware of Temp 101.6 And H.R. Remains At. Fib 120-140's See orders for CBC in A.M. Also to keep encouraging I.S. R.N. aware

## 2018-01-13 NOTE — Progress Notes (Signed)
CARDIAC REHAB PHASE I   PRE:  Rate/Rhythm: 121 afib    BP: sitting 114/81    SaO2: 98 RA  MODE:  Ambulation: 290 ft   POST:  Rate/Rhythm: 155 afib    BP: sitting 133/98     SaO2: 98 RA  Pt c/o being "uncomfortable". Sts his thoughts are racing when he closes his eyes. No appetite. Able to stand well but wanted to slump over RW while walking. Encouraged him to stand tall but he would eventually return to this posture and sts it requires increased mental strength to stand tall. HR 140-150s while walking, feels poorly. He normally takes an herbal supplement for ADHD and feels the lack of this is part of his problem.  Return to recliner.  [id number]  Harriet MassonRandi Kristan Porter Moes CES, ACSM 01/13/2018 12:15 PM

## 2018-01-14 ENCOUNTER — Inpatient Hospital Stay (HOSPITAL_COMMUNITY): Payer: Self-pay

## 2018-01-14 DIAGNOSIS — I361 Nonrheumatic tricuspid (valve) insufficiency: Secondary | ICD-10-CM

## 2018-01-14 LAB — CBC
HEMATOCRIT: 28 % — AB (ref 39.0–52.0)
Hemoglobin: 9.1 g/dL — ABNORMAL LOW (ref 13.0–17.0)
MCH: 28.7 pg (ref 26.0–34.0)
MCHC: 32.5 g/dL (ref 30.0–36.0)
MCV: 88.3 fL (ref 78.0–100.0)
Platelets: 241 10*3/uL (ref 150–400)
RBC: 3.17 MIL/uL — ABNORMAL LOW (ref 4.22–5.81)
RDW: 13.6 % (ref 11.5–15.5)
WBC: 10.3 10*3/uL (ref 4.0–10.5)

## 2018-01-14 LAB — ECHOCARDIOGRAM COMPLETE
HEIGHTINCHES: 75 in
WEIGHTICAEL: 4224 [oz_av]

## 2018-01-14 MED ORDER — MIDODRINE HCL 5 MG PO TABS
5.0000 mg | ORAL_TABLET | Freq: Three times a day (TID) | ORAL | Status: DC
  Administered 2018-01-14 – 2018-01-15 (×3): 5 mg via ORAL
  Filled 2018-01-14 (×4): qty 1

## 2018-01-14 MED ORDER — PERFLUTREN LIPID MICROSPHERE
1.0000 mL | INTRAVENOUS | Status: AC | PRN
  Administered 2018-01-14: 2 mL via INTRAVENOUS
  Filled 2018-01-14: qty 10

## 2018-01-14 NOTE — Progress Notes (Signed)
  Echocardiogram 2D Echocardiogram has been performed.  Leta JunglingCooper, Sherah Lund M 01/14/2018, 3:21 PM

## 2018-01-14 NOTE — Plan of Care (Signed)
  Problem: Coping: Goal: Level of anxiety will decrease Outcome: Progressing   Problem: Pain Managment: Goal: General experience of comfort will improve Outcome: Progressing   

## 2018-01-14 NOTE — Discharge Summary (Signed)
[address]       [place] [address]             [phone]      Physician Discharge Summary  Patient ID: Caleb [name] MRN: [id number] DOB/AGE: [birth year] 57 y.o.  Admit date: 01/08/2018 Discharge date: 01/18/2018  Admission Diagnoses: Ascending aortic aneurysm  Discharge Diagnoses:  Active Problems:   S/P ascending aortic aneurysm repair   S/P AVR   Atrial fibrillation with RVR (HCC)   Idiopathic hypotension  Patient Active Problem List   Diagnosis Date Noted  . S/P AVR   . Atrial fibrillation with RVR (HCC)   . Idiopathic hypotension   . S/P ascending aortic aneurysm repair 01/08/2018  . Dilated cardiomyopathy (HCC) 07/28/17  . At risk for sudden cardiac death 2017/07/28  . Long term current use of anticoagulant 2017-07-28  . Aortic insufficiency due to bicuspid aortic valve July 28, 2017  . Syncope 01/25/2017  . Chronic systolic CHF (congestive heart failure) (HCC)   . Persistent atrial fibrillation (HCC) 12/24/2016  . Stroke (HCC) 12/24/2016  . Acute ischemic stroke (HCC) 12/23/2016  . Ascending aortic aneurysm (HCC)   . Fx cervical vertebra-closed (HCC)    History of present illness: Patient is a 57 year old male with a history of atrial fibrillation, on Eliquis with prior strokes 1 of which occurred last year while on Eliquis.  Additionally has a history of hypertension, known bicuspid aortic valve with an ascending aortic aneurysm, and chronic systolic and diastolic heart failure.  Patient was originally referred to Dr. [name] in 2012 for consultation due to findings of a 5.1 cm fusiform ascending aortic aneurysm and recommended follow-up in 6 months however he did not return.  The patient has lived in [place] and has been returning to [place] where his family lives intermittently.  He was additionally diagnosed by cardiology with idiopathic dilated cardiomyopathy in July 2018 with an ejection fraction on echocardiogram of 20%.  On a previous  echocardiogram in 2002 his EF was 40 to 45%.  He was taken off diltiazem and started on Coreg, digoxin and losartan.  He presented at that time with an acute stroke with dysarthria and right arm weakness and MRI showed 3 tiny acute cortical infarcts in the bilateral frontal and left parietal lobes consistent with bilateral ACA and left MCA territories.  There was no significant stenosis or occlusion of the intracranial vessels.  It was felt that this was most likely embolic from his atrial fibrillation.  Echocardiogram at that time showed an EF of 20% with moderately dilated LV, moderate LVH, possible apical thrombus.  The aortic valve is bicuspid with no stenosis and moderate AI.  The leaflets were moderately thickened and calcified.  CTA of the chest showed some enlargement of the ascending aortic aneurysm to 5.3 x 5.5 cm.  This aneurysm extended from the aortic root up to the proximal arch with bovine origin of the innominate and left carotid arteries.  Dr. [name] last saw the patient on 01/26/2017 at that time he had just been started on medical therapy for his cardiomyopathy and it had a recent embolic stroke which it was felt best that he recover for before considering surgical treatment of his aortic aneurysm.  Most recently he stated that he has been doing well walking and going to the gym without difficulty.  He denied chest pain or pressure.  He does have some shortness of breath with climbing stairs and doing any heavy activity but his  normal daily activities do not cause any symptoms.  He saw Dr. Dulce Sellar in February and a repeat echocardiogram at that time showed his LV systolic dysfunction and an ejection fraction of 25 to 30%.  He has recently been reevaluated by Dr. [name] and was felt to be a candidate for proceeding with surgical intervention at this time.  He was admitted electively for the procedure.   Discharged Condition: good  Hospital Course: The patient was admitted electively and taken  to the operating room on 01/08/2018 where he underwent the below described procedure.  He tolerated well was taken to the surgical intensive care unit in stable condition.  Postoperative hospital course:  Patient is overall progressed nicely.  He did initially require some inotropic support but this was able to be weaned without difficulty over time.  He was weaned from the ventilator using standard protocols without difficulty and remains neurologically intact.  All routine lines, monitors and drainage devices have been discontinued in the standard fashion.  He does have chronic persistent atrial fibrillation and heart rate has been a significant issue post operatively.  Electrophysiology and cardiology has assisted with management.  He also developed some early postoperative fevers but cultures have remained negative and there is no other surgical identifiable source.  Currently he is defervesced and white blood cell count is minimally elevated.  Incisions are noted to be healing well without evidence of infection.  He is tolerating gradually increasing activities using standard cardiac rehabilitation protocols.  Oxygen has been weaned and he maintains good saturations on room air.  Echocardiogram done on 01/15/2008 showed ejection fraction of 30 to 35% which was similar to preop or possibly a little better.  There is a small pericardial effusion but this is not felt to be causative of any difficulties at this time.  Eliquis was placed temporarily on hold as we did not want to do develop hemorrhagic transformation of his pericarditis. It will be restarted at discharge.  It is noted he has an LA appendage clip.  He does have an expected acute blood loss anemia and values are stable with most recent hemoglobin 9.2 hematocrit 28.4.  Electrolytes and renal function are within normal limits.  The time of discharge the patient is felt to be quite stable.  Consults: cardiology /electrophysiology   Significant  Diagnostic Studies: Routine postoperative labs and serial chest x-rays.  Treatments: surgery:  CARDIOVASCULAR SURGERY OPERATIVE NOTE  01/08/2018  Surgeon:  Alleen Borne, MD  First Assistant: Gershon Crane,  PA-C   Preoperative Diagnosis:  Bicuspid aortic valve with moderate AI, 5.5 cm aortic root and ascending aortic aneurysm, chronic persistent atrial fibrillation.   Postoperative Diagnosis:  Same   Procedure:  1. Median Sternotomy 2. Extracorporeal circulation 3.   Replacement of the ascending aorta (hemi-arch) using a 30 mm Hemashield graft under deep hypothermic circulatory arrest 4.   Biological Bentall Procedure using a 27 mm Edwards Magna-Ease pericardial valve and a 30 mm Gelweave Valsalva graft. 5.   Placement of clip on left atrial appendage  Anesthesia:  General Endotracheal  Discharge Exam: Blood pressure 124/83, pulse 72, temperature 99.3 F (37.4 C), temperature source Oral, resp. rate 15, height 6\' 3"  (1.905 m), weight 117.4 kg, SpO2 96 %.  General appearance: alert, cooperative and no distress Heart: irregularly irregular rhythm and soft systolic murmur Lungs: dim ledt base Abdomen: benign Extremities: no edema Wound: incis healing well   Disposition: Discharge disposition: 01-Home or Self Care  Discharge Instructions    Discharge patient   Complete by:  As directed    Discharge disposition:  01-Home or Self Care   Discharge patient date:  01/18/2018     Allergies as of 01/18/2018   No Known Allergies     Medication List    STOP taking these medications   aspirin 81 MG EC tablet   carvedilol 3.125 MG tablet Commonly known as:  COREG   furosemide 40 MG tablet Commonly known as:  LASIX   potassium chloride SA 20 MEQ tablet Commonly known as:  K-DUR,KLOR-CON   sacubitril-valsartan 49-51 MG Commonly known as:  ENTRESTO     TAKE these medications   acetaminophen 325 MG tablet Commonly known as:  TYLENOL Take 2  tablets (650 mg total) by mouth every 6 (six) hours as needed for mild pain.   amiodarone 200 MG tablet Commonly known as:  PACERONE Take 1 tablet (200 mg total) by mouth 2 (two) times daily. For one week then take Amiodarone 200 mg daily thereafter.   apixaban 5 MG Tabs tablet Commonly known as:  ELIQUIS Take 1 tablet (5 mg total) by mouth 2 (two) times daily.   digoxin 0.125 MG tablet Commonly known as:  LANOXIN Take 1 tablet (0.125 mg total) by mouth daily. Start taking on:  01/19/2018   metoprolol succinate 100 MG 24 hr tablet Commonly known as:  TOPROL-XL Take 1 tablet (100 mg total) by mouth 2 (two) times daily. Take with or immediately following a meal.   OVER THE COUNTER MEDICATION Take 2 tablets by mouth daily. Noopept   oxyCODONE 5 MG immediate release tablet Commonly known as:  Oxy IR/ROXICODONE Take 1 tablet (5 mg total) by mouth every 4 (four) hours as needed for severe pain.   VITAMIN D PO Take 5,000 Units by mouth daily.      Follow-up Information    [facility] Follow up.   Specialty:  Cardiothoracic Surgery Why:  Appointment with the nurse for suture removal on 01/21/2018 at 10 AM. Contact information: [address], [address] [place] [id number] [phone]       Alleen BorneBartle, Bryan K, MD Follow up.   Specialty:  Cardiothoracic Surgery Why:  Appointment to see the surgeon on 02/10/2018 at 1 PM.  Please obtain a [facility] at [facility] at 12:30 PM.  [facility] is located in the same office complex on the first floor. Contact information: [address] [place] [address] [phone]        Abelino DerrickKilroy, Luke K, PA-C Follow up on 02/01/2018.   Specialties:  Cardiology, Radiology Why:  11:00AM. Cardiology visit Contact information: [address] [place] [address] [phone]          The patient has been discharged on:   1.Beta Blocker:   Yes [place].Etienne[y   ]                              No   [   ]                              If No, reason:  2.Ace Inhibitor/ARB: Yes [   ]  No  [  n  ]                                     If No, reason:low BP  3.Statin:   Yes [   ]                  No  [ n  ]                  If No, reason:no CAD  4.Marlowe KaysValentino Hue  [ y  ]                  No   [   ]                  If No, reason:  Signed: Rowe Clack 01/18/2018, 3:16 PM

## 2018-01-14 NOTE — Discharge Instructions (Signed)
Aortic Valve Replacement, Care After °Refer to this sheet in the next few weeks. These instructions provide you with information about caring for yourself after your procedure. Your health care provider may also give you more specific instructions. Your treatment has been planned according to current medical practices, but problems sometimes occur. Call your health care provider if you have any problems or questions after your procedure. °What can I expect after the procedure? °After the procedure, it is common to have: °· Pain around your incision area. °· A small amount of blood or clear fluid coming from your incision. ° °Follow these instructions at home: °Eating and drinking ° °· Follow instructions from your health care provider about eating or drinking restrictions. °? Limit alcohol intake to no more than 1 drink per day for nonpregnant women and 2 drinks per day for men. One drink equals 12 oz of beer, 5 oz of wine, or 1½ oz of hard liquor. °? Limit how much caffeine you drink. Caffeine can affect your heart's rate and rhythm. °· Drink enough fluid to keep your urine clear or pale yellow. °· Eat a heart-healthy diet. This should include plenty of fresh fruits and vegetables. If you eat meat, it should be lean cuts. Avoid foods that are: °? High in salt, saturated fat, or sugar. °? Canned or highly processed. °? Fried. °Activity °· Return to your normal activities as told by your health care provider. Ask your health care provider what activities are safe for you. °· Exercise regularly once you have recovered, as told by your health care provider. °· Avoid sitting for more than 2 hours at a time without moving. Get up and move around at least once every 1-2 hours. This helps to prevent blood clots in the legs. °· Do not lift anything that is heavier than 10 lb (4.5 kg) until your health care provider approves. °· Avoid pushing or pulling things with your arms until your health care provider approves. This  includes pulling on handrails to help you climb stairs. °Incision care ° °· Follow instructions from your health care provider about how to take care of your incision. Make sure you: °? Wash your hands with soap and water before you change your bandage (dressing). If soap and water are not available, use hand sanitizer. °? Change your dressing as told by your health care provider. °? Leave stitches (sutures), skin glue, or adhesive strips in place. These skin closures may need to stay in place for 2 weeks or longer. If adhesive strip edges start to loosen and curl up, you may trim the loose edges. Do not remove adhesive strips completely unless your health care provider tells you to do that. °· Check your incision area every day for signs of infection. Check for: °? More redness, swelling, or pain. °? More fluid or blood. °? Warmth. °? Pus or a bad smell. °Medicines °· Take over-the-counter and prescription medicines only as told by your health care provider. °· If you were prescribed an antibiotic medicine, take it as told by your health care provider. Do not stop taking the antibiotic even if you start to feel better. °Travel °· Avoid airplane travel for as long as told by your health care provider. °· When you travel, bring a list of your medicines and a record of your medical history with you. Carry your medicines with you. °Driving °· Ask your health care provider when it is safe for you to drive. Do not drive until your health   care provider approves. °· Do not drive or operate heavy machinery while taking prescription pain medicine. °Lifestyle ° °· Do not use any tobacco products, such as cigarettes, chewing tobacco, or e-cigarettes. If you need help quitting, ask your health care provider. °· Resume sexual activity as told by your health care provider. Do not use medicines for erectile dysfunction unless your health care provider approves, if this applies. °· Work with your health care provider to keep your  blood pressure and cholesterol under control, and to manage any other heart conditions that you have. °· Maintain a healthy weight. °General instructions °· Do not take baths, swim, or use a hot tub until your health care provider approves. °· Do not strain to have a bowel movement. °· Avoid crossing your legs while sitting down. °· Check your temperature every day for a fever. A fever may be a sign of infection. °· If you are a woman and you plan to become pregnant, talk with your health care provider before you become pregnant. °· Wear compression stockings if your health care provider instructs you to do this. These stockings help to prevent blood clots and reduce swelling in your legs. °· Tell all health care providers who care for you that you have an artificial (prosthetic) aortic valve. If you have or have had heart disease or endocarditis, tell all health care providers about these conditions as well. °· Keep all follow-up visits as told by your health care provider. This is important. °Contact a health care provider if: °· You develop a skin rash. °· You experience sudden, unexplained changes in your weight. °· You have more redness, swelling, or pain around your incision. °· You have more fluid or blood coming from your incision. °· Your incision feels warm to the touch. °· You have pus or a bad smell coming from your incision. °· You have a fever. °Get help right away if: °· You develop chest pain that is different from the pain coming from your incision. °· You develop shortness of breath or difficulty breathing. °· You start to feel light-headed. °These symptoms may represent a serious problem that is an emergency. Do not wait to see if the symptoms will go away. Get medical help right away. Call your local emergency services (911 in the [place] Do not drive yourself to the hospital. °This information is not intended to replace advice given to you by your health care provider. Make sure you discuss any  questions you have with your health care provider. °Document Released: 12/12/2004 Document Revised: 11/01/2015 Document Reviewed: 04/29/2015 °Elsevier Interactive Patient Education © 2017 [employer] ° °

## 2018-01-14 NOTE — Progress Notes (Signed)
CARDIAC REHAB PHASE I   PRE:  Rate/Rhythm: 113 afib    BP: sitting 113/92    SaO2: 98 RA  MODE:  Ambulation: 430 ft   POST:  Rate/Rhythm: 166 afib max, mostly 140-150s    BP: sitting 118/102     SaO2: 97 RA  Pt sts he is feeling better today. Able to get out of bed and walk with RW. He is still bending neck and looking down. ? If he will do better without RW. Will trial this tomorrow. He does not want RW for home. HR still high with walking, max 166. Pt unaware. To recliner, encouraged more walking today (only walked x1 yesterday).  [phone]   Harriet MassonRandi Kristan Tiesha Marich CES, ACSM 01/14/2018 10:25 AM

## 2018-01-14 NOTE — Progress Notes (Addendum)
[address]       [place] [address]             [phone]      6 Days Post-Op Procedure(s) (LRB): BENTALL PROCEDURE (N/A) ASCENDING AORTIC ROOT REPLACEMENT (N/A) TRANSESOPHAGEAL ECHOCARDIOGRAM (TEE) (N/A) CLIPPING OF ATRIAL APPENDAGE using Atricure Clip size 40 (N/A) Subjective: Feels much better, rate control improving  Objective: Vital signs in last 24 hours: Temp:  [99.8 F (37.7 C)-101.9 F (38.8 C)] 99.8 F (37.7 C) (08/08 0322) Pulse Rate:  [54-141] 110 (08/08 0322) Cardiac Rhythm: Atrial fibrillation (08/07 2100) Resp:  [11-24] 18 (08/08 0322) BP: (100-137)/(74-108) 137/108 (08/08 0322) SpO2:  [87 %-100 %] 96 % (08/08 0322) Weight:  [119.7 kg] 119.7 kg (08/08 0322)  Hemodynamic parameters for last 24 hours:    Intake/Output from previous day: 08/07 0701 - 08/08 0700 In: 1200 [P.O.:1200] Out: 2750 [Urine:2750] Intake/Output this shift: No intake/output data recorded.  General appearance: alert, cooperative and no distress Heart: irregularly irregular rhythm Lungs: clear to auscultation bilaterally Abdomen: benign Extremities: no edema Wound: incis healing well  Lab Results: Recent Labs    01/13/18 0302 01/14/18 0301  WBC 10.2 10.3  HGB 9.6* 9.1*  HCT 29.6* 28.0*  PLT 210 241   BMET:  Recent Labs    01/12/18 0233  NA 135  K 3.8  CL 94*  CO2 30  GLUCOSE 107*  BUN 12  CREATININE 1.12  CALCIUM 8.6*    PT/INR: No results for input(s): LABPROT, INR in the last 72 hours. ABG    Component Value Date/Time   PHART 7.344 (L) 01/08/2018 2127   HCO3 26.2 01/08/2018 2127   TCO2 23 01/09/2018 1739   ACIDBASEDEF 2.0 01/08/2018 2110   O2SAT 99.0 01/08/2018 2127   CBG (last 3)  No results for input(s): GLUCAP in the last 72 hours.  Meds Scheduled Meds: . apixaban  5 mg Oral BID  . aspirin EC  81 mg Oral Daily  . digoxin  0.25 mg Oral Daily  . docusate sodium  200 mg Oral Daily  . guaiFENesin  600 mg Oral BID  .  mouth rinse  15 mL Mouth Rinse BID  . metoprolol succinate  100 mg Oral Daily  . midodrine  10 mg Oral TID WC  . pantoprazole  40 mg Oral QAC breakfast  . sodium chloride flush  3 mL Intravenous Q12H   Continuous Infusions: . sodium chloride     PRN Meds:.sodium chloride, acetaminophen, ALPRAZolam, ondansetron **OR** ondansetron (ZOFRAN) IV, oxyCODONE, sodium chloride flush, traMADol  Xrays No results found.  Assessment/Plan: S/P Procedure(s) (LRB): BENTALL PROCEDURE (N/A) ASCENDING AORTIC ROOT REPLACEMENT (N/A) TRANSESOPHAGEAL ECHOCARDIOGRAM (TEE) (N/A) CLIPPING OF ATRIAL APPENDAGE using Atricure Clip size 40 (N/A)  1 doing well , feels better.  2 HR improving , but remains elevated. He is taking "Brain supplement" which I told him is against our advice d/t unproven and controversial claims and caffeine. He is taking anyway as it makes him feel better. 3 tmax 101.9- no obvious source, urine and blood cultures sent. Monitor closely, d/c current IV and replace. No leukocytosis  4 cont to push pulm toilet and rehab 5 H/H is pretty stable    LOS: 6 days    Rowe Clack 01/14/2018   Chart reviewed, patient examined, agree with above. He had temp 101.9 last pm. 99 now. BC and UC sent. WBC ct normal. It is unclear what the source of fever is. Incision  looks good, lungs clear. IV site changed this am in case that is source. He could have pericarditis but does not have a rub. He reportedly felt good this am but now says he does not feel good, very tired because he did not sleep well. I am hesitant to just start broad spectrum antibiotics with no source for fever but may need to if he has further significant fever. Will get a repeat echo to assess LV function and valve.  He still has HR of 110-120's at rest in atrial fib and gets up to 150-160 with ambulation. On digoxin 0.25 and Toprol 100 daily. Will check digoxin level in am.  Sister and mother in the room and I discussed status and  plans with them.

## 2018-01-14 NOTE — Progress Notes (Signed)
Patient is very irritable and upset about having xanax schedule for only bedtime. Will like the frequency increased. Patient will address it with provider during rounds.Patient slept from about 2100 to 0320. Does not feel like walking. HR still gets up to 130's, but mostly 115- to 120's. Temp trending down 99.4

## 2018-01-14 NOTE — Progress Notes (Signed)
Patient ID: Caleb [name], male   DOB: 11/30/1960, 57 y.o.   MRN: [id number]  Cardiothoracic Surgery  He remains afebrile today so far with Tmax of 99.6.  Hemodynamically stable but HR remains 120's at rest in atrial fib. 2D echo from today reviewed. He has EF of 30-35% which is similar to preop, maybe a little better. The aortic valve looks good with low gradient. There is a small pericardial effusion. I don't think it is enough to cause any problem at this time.  Will hold Eliquis for now in case he has some pericarditis to prevent hemorrhagic transformation. He has a LAA clip.  He feels better tonight. Had BM, ambulated.

## 2018-01-15 DIAGNOSIS — I712 Thoracic aortic aneurysm, without rupture: Principal | ICD-10-CM

## 2018-01-15 DIAGNOSIS — I4891 Unspecified atrial fibrillation: Secondary | ICD-10-CM

## 2018-01-15 DIAGNOSIS — I959 Hypotension, unspecified: Secondary | ICD-10-CM

## 2018-01-15 DIAGNOSIS — Z952 Presence of prosthetic heart valve: Secondary | ICD-10-CM

## 2018-01-15 DIAGNOSIS — I950 Idiopathic hypotension: Secondary | ICD-10-CM

## 2018-01-15 LAB — CBC
HEMATOCRIT: 28.4 % — AB (ref 39.0–52.0)
HEMOGLOBIN: 9.2 g/dL — AB (ref 13.0–17.0)
MCH: 28.9 pg (ref 26.0–34.0)
MCHC: 32.4 g/dL (ref 30.0–36.0)
MCV: 89.3 fL (ref 78.0–100.0)
Platelets: 259 10*3/uL (ref 150–400)
RBC: 3.18 MIL/uL — ABNORMAL LOW (ref 4.22–5.81)
RDW: 13.7 % (ref 11.5–15.5)
WBC: 11.3 10*3/uL — ABNORMAL HIGH (ref 4.0–10.5)

## 2018-01-15 LAB — COMPREHENSIVE METABOLIC PANEL
ALT: 68 U/L — AB (ref 0–44)
AST: 60 U/L — AB (ref 15–41)
Albumin: 2.8 g/dL — ABNORMAL LOW (ref 3.5–5.0)
Alkaline Phosphatase: 42 U/L (ref 38–126)
Anion gap: 12 (ref 5–15)
BUN: 13 mg/dL (ref 6–20)
CHLORIDE: 94 mmol/L — AB (ref 98–111)
CO2: 28 mmol/L (ref 22–32)
CREATININE: 1.08 mg/dL (ref 0.61–1.24)
Calcium: 8.5 mg/dL — ABNORMAL LOW (ref 8.9–10.3)
GFR calc Af Amer: >60 mL/min (ref >60)
Glucose, Bld: 98 mg/dL (ref 70–99)
Potassium: 3.8 mmol/L (ref 3.5–5.1)
Sodium: 134 mmol/L — ABNORMAL LOW (ref 135–145)
Total Bilirubin: 2.8 mg/dL — ABNORMAL HIGH (ref 0.3–1.2)
Total Protein: 5.8 g/dL — ABNORMAL LOW (ref 6.5–8.1)

## 2018-01-15 LAB — URINE CULTURE: SPECIAL REQUESTS: NORMAL

## 2018-01-15 LAB — DIGOXIN LEVEL: DIGOXIN LVL: 0.8 ng/mL (ref 0.8–2.0)

## 2018-01-15 MED ORDER — METOPROLOL SUCCINATE ER 100 MG PO TB24
100.0000 mg | ORAL_TABLET | Freq: Two times a day (BID) | ORAL | Status: DC
  Administered 2018-01-16 – 2018-01-18 (×6): 100 mg via ORAL
  Filled 2018-01-15 (×6): qty 1

## 2018-01-15 MED ORDER — DIGOXIN 125 MCG PO TABS
0.2500 mg | ORAL_TABLET | Freq: Two times a day (BID) | ORAL | Status: AC
  Administered 2018-01-15 (×2): 0.25 mg via ORAL
  Filled 2018-01-15 (×2): qty 2

## 2018-01-15 MED ORDER — DIGOXIN 125 MCG PO TABS
0.2500 mg | ORAL_TABLET | Freq: Every day | ORAL | Status: DC
  Administered 2018-01-15: 0.125 mg via ORAL
  Administered 2018-01-16 – 2018-01-18 (×3): 0.25 mg via ORAL
  Filled 2018-01-15 (×3): qty 2

## 2018-01-15 NOTE — Progress Notes (Addendum)
Progress Note  Patient Name: Caleb [name] Date of Encounter: 01/15/2018  Primary Cardiologist: Thurmon Fair, MD   Subjective   Patient is without complaints this morning. He is unaware of his atrial fibrillation and does not feel heart racing or palpitations. He denies SOB at rest or with ambulation. He reports that he's recovering well from recent Bentall procedure and denies chest pain. No infectious complaints.   Inpatient Medications    Scheduled Meds: . aspirin EC  81 mg Oral Daily  . digoxin  0.25 mg Oral BID  . [START ON 01/16/2018] digoxin  0.25 mg Oral Daily  . docusate sodium  200 mg Oral Daily  . guaiFENesin  600 mg Oral BID  . mouth rinse  15 mL Mouth Rinse BID  . metoprolol succinate  100 mg Oral Daily  . midodrine  5 mg Oral TID WC  . pantoprazole  40 mg Oral QAC breakfast  . sodium chloride flush  3 mL Intravenous Q12H   Continuous Infusions: . sodium chloride     PRN Meds: sodium chloride, acetaminophen, ALPRAZolam, ondansetron **OR** ondansetron (ZOFRAN) IV, oxyCODONE, sodium chloride flush, traMADol   Vital Signs    Vitals:   01/14/18 1606 01/14/18 2018 01/15/18 0407 01/15/18 0700  BP: 102/86 (!) 128/97 121/75   Pulse: (!) 109 (!) 112 (!) 109   Resp: 18 20 (!) 21 19  Temp: 99.6 F (37.6 C) 98.9 F (37.2 C) 99.1 F (37.3 C)   TempSrc: Oral Oral Oral   SpO2: 98% 96% 97%   Weight:   118.4 kg   Height:        Intake/Output Summary (Last 24 hours) at 01/15/2018 1007 Last data filed at 01/15/2018 0807 Gross per 24 hour  Intake 480 ml  Output 1000 ml  Net -520 ml   Filed Weights   01/13/18 0343 01/14/18 0322 01/15/18 0407  Weight: 120 kg 119.7 kg 118.4 kg    Telemetry    Atrial fibrillation with persistently elevated HR's over the past 48 hours - Personally Reviewed   Physical Exam   GEN: Sitting upright in bedside chair in no acute distress.   Neck: No JVD, no carotid bruits Cardiac: IRIR, no murmurs, rubs, or gallops. Midline incision  is healing well without bleeding or drainage.  Respiratory: Clear to auscultation bilaterally, no wheezes/ rales/ rhonchi GI: NABS, Soft, nontender, non-distended  MS: No edema; No deformity. Neuro:  Nonfocal, moving all extremities spontaneously Psych: Normal affect   Labs    Chemistry Recent Labs  Lab 01/11/18 0251 01/12/18 0233 01/15/18 0416  NA 134* 135 134*  K 3.7 3.8 3.8  CL 95* 94* 94*  CO2 28 30 28   GLUCOSE 100* 107* 98  BUN 13 12 13   CREATININE 1.03 1.12 1.08  CALCIUM 8.3* 8.6* 8.5*  PROT  --   --  5.8*  ALBUMIN  --   --  2.8*  AST  --   --  60*  ALT  --   --  68*  ALKPHOS  --   --  42  BILITOT  --   --  2.8*  GFRNONAA >60 >60 >60  GFRAA >60 >60 >60  ANIONGAP 11 11 12      Hematology Recent Labs  Lab 01/13/18 0302 01/14/18 0301 01/15/18 0416  WBC 10.2 10.3 11.3*  RBC 3.34* 3.17* 3.18*  HGB 9.6* 9.1* 9.2*  HCT 29.6* 28.0* 28.4*  MCV 88.6 88.3 89.3  MCH 28.7 28.7 28.9  MCHC 32.4 32.5 32.4  RDW 13.5 13.6 13.7  PLT 210 241 259    Cardiac EnzymesNo results for input(s): TROPONINI in the last 168 hours. No results for input(s): TROPIPOC in the last 168 hours.   BNPNo results for input(s): BNP, PROBNP in the last 168 hours.   DDimer No results for input(s): DDIMER in the last 168 hours.   Radiology    No results found.  Cardiac Studies   Echocardiogram 01/14/18: Study Conclusions  - Procedure narrative: Transthoracic echocardiography. Image quality was poor. The study was technically difficult, as a result of excessive abdominal air. Intravenous contrast (Definity) was administered. - Left ventricle: The cavity size was normal. Wall thickness was increased in a pattern of moderate LVH. Systolic function was moderately to severely reduced. The estimated ejection fraction was in the range of 30% to 35%. Diffuse hypokinesis. The study is not technically sufficient to allow evaluation of LV diastolic function. - Aortic valve: s/p 27 mm bioprosthetic  Edwards Magna-Ease valve. No obstruction, well-seated. No paravalvular leak. Valve area (VTI): 2.85 cm^2. Valve area (Vmax): 2.74 cm^2. Valve area (Vmean): 2.49 cm^2. - Aorta: s/p Bentall repair. Aortic root dimension: 33 mm (ED). - Aortic root: The aortic root was normal in size. - Mitral valve: Mildly thickened leaflets . There was trivial   regurgitation. - Left atrium: Moderately dilated. - Tricuspid valve: There was moderate regurgitation. - Systemic veins: Not visualized. - Pericardium, extracardiac: Moderate pericardial effusion. There was a left pleural effusion. Unable to fully evaluate for tamponade physiology.  Impressions:  - Compared to a prior study in 07/2017, the LVEF may be slightly higher at 30-35%. The bioprosthetic aortic valve appears to be functioning normally. There is a moderate-sized pericardial effusion and left pleural effusion. The aortic root repair size is normal.  Patient Profile     57 y.o. 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 Cardiology following for Afib RVR   Assessment & Plan    1. Atrial fibrillation with RVR: rate has proved difficult to control over the past 48-72 hours despite increasing home digoxin and adjusting metoprolol dosing. BP has been stable on midodrine.  - Continue to hold eliquis in light of moderate-sized pericardial effusion noted on echo yesterday.  - Continue digoxin 0.25mg  daily  - Would continue metoprolol 100mg  BID - Would like to avoid diltiazem given severely reduced EF.  - Will decrease midodrine today  2. Pericardial effusion: no evidence of tamponade. Eliquis on hold to prevent hemorrhagic conversion  - Will need follow-up echo prior to discharge to monitor for improvement.   3. S/p Bentall procedure and aortic root replacement  8/2: patient opted for bioprosthetic valve so he would be able to remain on eliquis and avoid the inconveniences of coumadin. He appears to be recovering well from surgery.  - Continue management per primary team  4. Dilated cardiomyopathy/ systolic CHF: No volume overload complaints and he appears euvolemic on exam today. Coreg transitioned to metoprolol this admission for better HR control and less effects on BP. Sherryll Burgerntresto has not been restarted yet given borderline BP's - Continue metoprolol at current dose today - Resume entresto once BP stable   For questions or updates, please contact [facility] Please consult [email or web address] for contact info under Cardiology/STEMI.     Signed, Beatriz StallionKrista M. Kroeger, PA-C  01/15/2018, 10:07 AM   [phone]  The patient was  seen, examined and discussed with Beatriz Stallion, PA-C  and I agree with the above.   The patient remains in atrial fibrillation with RVR, I have reviewed his echocardiogram from yesterday and he has mild to moderate pericardial effusion but no signs of tamponade. We will continue metoprolol 100 md PO BID, BP improved, avoid midodrine if possible, continue digoxin 0.25 mg po daily and decrease to 0.125 mg po daily once HR < 100, we are unable to add Entresto because of low BP. We will follow.   Tobias Alexander, MD 01/15/2018

## 2018-01-15 NOTE — Progress Notes (Signed)
CARDIAC REHAB PHASE I   PRE:  Rate/Rhythm: 115 afib    BP: sitting 125/83    SaO2: 95 RA  MODE:  Ambulation: 550 ft   POST:  Rate/Rhythm: 150 afib max, mostly 130-140    BP: sitting 115/91     SaO2: 99 RA   Pt has been sleeping all day. This was third attempt. Still sleepy. Sts this is what his body needs. Otherwise feels well. Able to walk without RW. HR slightly lower today. Only c/o is feeling sleepy. To recliner for bath.  [id number]  Harriet MassonRandi Kristan Trica Usery CES, ACSM 01/15/2018 3:12 PM

## 2018-01-15 NOTE — Progress Notes (Addendum)
[address]       [place] [id number]             [phone]      7 Days Post-Op Procedure(s) (LRB): BENTALL PROCEDURE (N/A) ASCENDING AORTIC ROOT REPLACEMENT (N/A) TRANSESOPHAGEAL ECHOCARDIOGRAM (TEE) (N/A) CLIPPING OF ATRIAL APPENDAGE using Atricure Clip size 40 (N/A) Subjective: No fevers, HR control remains an issue especially with ambulation  Objective: Vital signs in last 24 hours: Temp:  [98.9 F (37.2 C)-99.6 F (37.6 C)] 99.1 F (37.3 C) (08/09 0407) Pulse Rate:  [109-129] 109 (08/09 0407) Cardiac Rhythm: Atrial fibrillation (08/09 0351) Resp:  [18-21] 21 (08/09 0407) BP: (102-129)/(75-98) 121/75 (08/09 0407) SpO2:  [94 %-99 %] 97 % (08/09 0407) Weight:  [118.4 kg] 118.4 kg (08/09 0407)  Hemodynamic parameters for last 24 hours:    Intake/Output from previous day: 08/08 0701 - 08/09 0700 In: 720 [P.O.:720] Out: 1000 [Urine:1000] Intake/Output this shift: No intake/output data recorded.  General appearance: alert, cooperative and no distress Heart: irregularly irregular rhythm and tachy Lungs: dim left>base Abdomen: benign Extremities: minor edema Wound: incis healing well  Lab Results: Recent Labs    01/14/18 0301 01/15/18 0416  WBC 10.3 11.3*  HGB 9.1* 9.2*  HCT 28.0* 28.4*  PLT 241 259   BMET:  Recent Labs    01/15/18 0416  NA 134*  K 3.8  CL 94*  CO2 28  GLUCOSE 98  BUN 13  CREATININE 1.08  CALCIUM 8.5*    PT/INR: No results for input(s): LABPROT, INR in the last 72 hours. ABG    Component Value Date/Time   PHART 7.344 (L) 01/08/2018 2127   HCO3 26.2 01/08/2018 2127   TCO2 23 01/09/2018 1739   ACIDBASEDEF 2.0 01/08/2018 2110   O2SAT 99.0 01/08/2018 2127   CBG (last 3)  No results for input(s): GLUCAP in the last 72 hours.  Meds Scheduled Meds: . aspirin EC  81 mg Oral Daily  . digoxin  0.25 mg Oral Daily  . docusate sodium  200 mg Oral Daily  . guaiFENesin  600 mg Oral BID  . mouth rinse  15 mL  Mouth Rinse BID  . metoprolol succinate  100 mg Oral Daily  . midodrine  5 mg Oral TID WC  . pantoprazole  40 mg Oral QAC breakfast  . sodium chloride flush  3 mL Intravenous Q12H   Continuous Infusions: . sodium chloride     PRN Meds:.sodium chloride, acetaminophen, ALPRAZolam, ondansetron **OR** ondansetron (ZOFRAN) IV, oxyCODONE, sodium chloride flush, traMADol  Xrays No results found.  Results for orders placed or performed during the hospital encounter of 01/08/18  Culture, blood (routine x 2)     Status: None (Preliminary result)   Collection Time: 01/13/18  6:33 PM  Result Value Ref Range Status   Specimen Description BLOOD RIGHT ANTECUBITAL  Final   Special Requests   Final    BOTTLES DRAWN AEROBIC AND ANAEROBIC Blood Culture adequate volume   Culture   Final    NO GROWTH < 24 HOURS Performed at [facility], [address] [place], [place] [address]    Report Status PENDING  Incomplete    Assessment/Plan: S/P Procedure(s) (LRB): BENTALL PROCEDURE (N/A) ASCENDING AORTIC ROOT REPLACEMENT (N/A) TRANSESOPHAGEAL ECHOCARDIOGRAM (TEE) (N/A) CLIPPING OF ATRIAL APPENDAGE using Atricure Clip size 40 (N/A)   1 doing well 2 afib with increased HR remains an issue. Dig level 0.8- will give extra dose today, consider adding diltiazem  3 wbc slightly elevated, H/H stable 4 glucose well controlled 5 hopefully home in next 1-2 days   LOS: 7 days    Rowe Clack [facility] 01/15/2018 Pager [phone]   Chart reviewed, patient examined, agree with above. HR is 110-120 at rest today. As recommended by cardiology will increase Toprol to 100 bid. BP is better so will stop midodrine. Low grade temps to 99.6 max yesterday. WBC normal. BC negative, UC likely contaminate.

## 2018-01-16 MED ORDER — AMIODARONE HCL 200 MG PO TABS
400.0000 mg | ORAL_TABLET | Freq: Two times a day (BID) | ORAL | Status: DC
  Administered 2018-01-16 (×2): 400 mg via ORAL
  Filled 2018-01-16 (×2): qty 2

## 2018-01-16 NOTE — Progress Notes (Addendum)
      [address]       [place] [id number]             [phone]        8 Days Post-Op Procedure(s) (LRB): BENTALL PROCEDURE (N/A) ASCENDING AORTIC ROOT REPLACEMENT (N/A) TRANSESOPHAGEAL ECHOCARDIOGRAM (TEE) (N/A) CLIPPING OF ATRIAL APPENDAGE using Atricure Clip size 40 (N/A)  Subjective: Patient sleeping and awakened.  Objective: Vital signs in last 24 hours: Temp:  [98.8 F (37.1 C)-99.9 F (37.7 C)] 99.1 F (37.3 C) (08/10 0310) Pulse Rate:  [102-127] 102 (08/10 0310) Cardiac Rhythm: Atrial fibrillation (08/09 2325) Resp:  [14-20] 14 (08/09 2325) BP: (98-109)/(74-80) 98/80 (08/09 1938) SpO2:  [97 %-98 %] 98 % (08/10 0310) Weight:  [118.3 kg] 118.3 kg (08/10 0310)  Pre op weight  128.6 kg Current Weight  01/16/18 118.3 kg      Intake/Output from previous day: 08/09 0701 - 08/10 0700 In: 240 [P.O.:240] Out: 2000 [Urine:2000]   Physical Exam:  Cardiovascular: IRRR IRRR Pulmonary: Diminished at bases L>R Abdomen: Soft, non tender, bowel sounds present. Extremities: Trace bilateral lower extremity edema. Wounds: Clean and dry.  No erythema or signs of infection.  Lab Results: CBC: Recent Labs    01/14/18 0301 01/15/18 0416  WBC 10.3 11.3*  HGB 9.1* 9.2*  HCT 28.0* 28.4*  PLT 241 259   BMET:  Recent Labs    01/15/18 0416  NA 134*  K 3.8  CL 94*  CO2 28  GLUCOSE 98  BUN 13  CREATININE 1.08  CALCIUM 8.5*    PT/INR:  Lab Results  Component Value Date   INR 1.39 01/08/2018   INR 2.46 01/08/2018   INR 1.14 01/06/2018   ABG:  INR: Will add last result for INR, ABG once components are confirmed Will add last 4 CBG results once components are confirmed  Assessment/Plan:  1. CV - A fib with HR 100's.  HR increased into the 130's + with ambulation. On Digoxin 0.250 mg daily and Toprol XL 100 mg bid. As discussed with Dr. Donata ClayVan Trigt, will start oral Amiodarone. Cardiology is following. Apixaban on hold secondary to  moderate sized pericardial effusion seen on echo. He will need repeat echo prior to discharge to re evaluate pericardial effusion. BP still too labile to restart Entresto 2.  Pulmonary - On room air. Encourage incentive spirometer 3.  Acute blood loss anemia - H and H yesterday stable at 9.2 and 28.4   Donielle M ZimmermanPA-C 01/16/2018,7:16 AM [phone]  Patient examined, most recent echocardiogram showing mild-moderate postop pericardial effusion personally reviewed. Patient becomes tachycardic with activity.  Part of this is probably related to his reduced LV function as well as rapid postop A. fib.  We will add oral no load amiodarone for a few days in attempt to reduce tachycardia.  LFTs are elevated today.  Recheck in a.m. with chest x-ray. Agree the patient will need repeat echocardiogram prior to discharge.  Maintaining hemoglobin greater than 8.5  because of poor LV function and increased heart rate.  Lovett SoxPeter Vantrigt MD

## 2018-01-16 NOTE — Progress Notes (Signed)
CARDIAC REHAB PHASE I   PRE:  Rate/Rhythm: 102 afib    BP: sitting 116/87    SaO2:   MODE:  Ambulation: 430 ft   POST:  Rate/Rhythm: 135 afib max, mostly 120s    BP: sitting 128/84     SaO2: 95 RA  Pt depressed/frustrated. Sts he wants a shower. Afraid he will have to stay 2-3 more days. Able to stand independently but needs reminders for sternal precautions. Fairly steady in hall walking but keeps his head down. Slow pace. HR more controlled today, mostly 120s. Return to recliner. Ed completed with good reception. He declined HF booklet but sts he weighs daily and watches his sodium. Gave him videos to watch. Will refer to [place] CRP[facility] however he likes to exercise on his own.  [id number]   Harriet MassonRandi Kristan Fahad Cisse CES, ACSM 01/16/2018 11:38 AM

## 2018-01-16 NOTE — Progress Notes (Signed)
Progress Note  Patient Name: Caleb [name] Date of Encounter: 01/16/2018  Primary Cardiologist: Thurmon FairMihai Croitoru, MD   Subjective   No complaints.   Inpatient Medications    Scheduled Meds: . aspirin EC  81 mg Oral Daily  . digoxin  0.25 mg Oral Daily  . docusate sodium  200 mg Oral Daily  . guaiFENesin  600 mg Oral BID  . mouth rinse  15 mL Mouth Rinse BID  . metoprolol succinate  100 mg Oral BID  . pantoprazole  40 mg Oral QAC breakfast  . sodium chloride flush  3 mL Intravenous Q12H   Continuous Infusions: . sodium chloride     PRN Meds: sodium chloride, acetaminophen, ALPRAZolam, ondansetron **OR** ondansetron (ZOFRAN) IV, oxyCODONE, sodium chloride flush, traMADol   Vital Signs    Vitals:   01/15/18 1712 01/15/18 1938 01/15/18 2325 01/16/18 0310  BP:  98/80    Pulse: (!) 127 (!) 107  (!) 102  Resp:  20 14   Temp:  99.9 F (37.7 C) 99.6 F (37.6 C) 99.1 F (37.3 C)  TempSrc:  Oral Oral   SpO2:  98% 97% 98%  Weight:    118.3 kg  Height:        Intake/Output Summary (Last 24 hours) at 01/16/2018 0827 Last data filed at 01/16/2018 [id number] Gross per 24 hour  Intake -  Output 2625 ml  Net -2625 ml   Filed Weights   01/14/18 0322 01/15/18 0407 01/16/18 0310  Weight: 119.7 kg 118.4 kg 118.3 kg    Telemetry    afib elevated rate - Personally Reviewed  ECG    na- Personally Reviewed  Physical Exam   GEN: No acute distress.   Neck: No JVD Cardiac: irreg, rate 110, Respiratory: Clear to auscultation bilaterally. GI: Soft, nontender, non-distended  MS: No edema; No deformity. Neuro:  Nonfocal  Psych: Normal affect   Labs    Chemistry Recent Labs  Lab 01/11/18 0251 01/12/18 0233 01/15/18 0416  NA 134* 135 134*  K 3.7 3.8 3.8  CL 95* 94* 94*  CO2 28 30 28   GLUCOSE 100* 107* 98  BUN 13 12 13   CREATININE 1.03 1.12 1.08  CALCIUM 8.3* 8.6* 8.5*  PROT  --   --  5.8*  ALBUMIN  --   --  2.8*  AST  --   --  60*  ALT  --   --  68*  ALKPHOS  --    --  42  BILITOT  --   --  2.8*  GFRNONAA >60 >60 >60  GFRAA >60 >60 >60  ANIONGAP 11 11 12      Hematology Recent Labs  Lab 01/13/18 0302 01/14/18 0301 01/15/18 0416  WBC 10.2 10.3 11.3*  RBC 3.34* 3.17* 3.18*  HGB 9.6* 9.1* 9.2*  HCT 29.6* 28.0* 28.4*  MCV 88.6 88.3 89.3  MCH 28.7 28.7 28.9  MCHC 32.4 32.5 32.4  RDW 13.5 13.6 13.7  PLT 210 241 259    Cardiac EnzymesNo results for input(s): TROPONINI in the last 168 hours. No results for input(s): TROPIPOC in the last 168 hours.   BNPNo results for input(s): BNP, PROBNP in the last 168 hours.   DDimer No results for input(s): DDIMER in the last 168 hours.   Radiology    No results found.  Cardiac Studies     Patient Profile        [age] y.o. 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 Cardiology following for Afib RVR   Assessment & Plan    1. Afib with RVR - history of afib on eliquis at home - issues with afib with RVR this admission. Lopressor and digoxin has been titrated up - anticoag on hold due to pericardila effusion - from primary cardiology note he has a severely dilated left atrium and it is highly unlikely we will ever restore normal rhythm - avoid dilt due to low LVEF  - not a lot of great options left for rate control.  Toprol night dose was held, we will lower the hold parameters. Could titrate up to 125mg  bid if neccesary and bp would permit, at this time elevated rates this AM appear due to held dose.   2. Pericardial effusion - 01/14/18 echo LVEF 30-35%, diffuse hypokinesis. Moderate effusion - needs repeat study prior to discharge.   - repeat echo tomorrow.    3. 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   4. Chronic systolic HF - - 01/14/18 echo LVEF 30-35%,  diffuse hypokinesis - coreg changed to metoprolol this adimssion due to soft bp's and need for better rate control for afib - entresto on hold due to soft bp's.     For questions or updates, please contact [facility] Please consult [email or web address] for contact info under Cardiology/STEMI.      Joanie Coddington, MD  01/16/2018, 8:27 AM

## 2018-01-17 ENCOUNTER — Inpatient Hospital Stay (HOSPITAL_COMMUNITY): Payer: No Typology Code available for payment source

## 2018-01-17 ENCOUNTER — Inpatient Hospital Stay (HOSPITAL_COMMUNITY): Payer: Self-pay

## 2018-01-17 DIAGNOSIS — I313 Pericardial effusion (noninflammatory): Secondary | ICD-10-CM

## 2018-01-17 LAB — COMPREHENSIVE METABOLIC PANEL
ALT: 64 U/L — ABNORMAL HIGH (ref 0–44)
AST: 44 U/L — ABNORMAL HIGH (ref 15–41)
Albumin: 2.7 g/dL — ABNORMAL LOW (ref 3.5–5.0)
Alkaline Phosphatase: 49 U/L (ref 38–126)
Anion gap: 10 (ref 5–15)
BUN: 11 mg/dL (ref 6–20)
CO2: 31 mmol/L (ref 22–32)
Calcium: 8.6 mg/dL — ABNORMAL LOW (ref 8.9–10.3)
Chloride: 95 mmol/L — ABNORMAL LOW (ref 98–111)
Creatinine, Ser: 1.1 mg/dL (ref 0.61–1.24)
GFR calc Af Amer: >60 mL/min (ref >60)
GFR calc non Af Amer: >60 mL/min (ref >60)
Glucose, Bld: 119 mg/dL — ABNORMAL HIGH (ref 70–99)
Potassium: 4 mmol/L (ref 3.5–5.1)
Sodium: 136 mmol/L (ref 135–145)
Total Bilirubin: 1.9 mg/dL — ABNORMAL HIGH (ref 0.3–1.2)
Total Protein: 6.3 g/dL — ABNORMAL LOW (ref 6.5–8.1)

## 2018-01-17 LAB — URINALYSIS, ROUTINE W REFLEX MICROSCOPIC
BILIRUBIN URINE: NEGATIVE
Bacteria, UA: NONE SEEN
GLUCOSE, UA: NEGATIVE mg/dL
KETONES UR: NEGATIVE mg/dL
Leukocytes, UA: NEGATIVE
Nitrite: NEGATIVE
PROTEIN: NEGATIVE mg/dL
SPECIFIC GRAVITY, URINE: 1.004 — AB (ref 1.005–1.030)
pH: 6 (ref 5.0–8.0)

## 2018-01-17 LAB — ECHOCARDIOGRAM LIMITED
HEIGHTINCHES: 75 in
WEIGHTICAEL: 4148.18 [oz_av]

## 2018-01-17 MED ORDER — METOPROLOL SUCCINATE ER 100 MG PO TB24
100.0000 mg | ORAL_TABLET | Freq: Two times a day (BID) | ORAL | 1 refills | Status: DC
Start: 2018-01-17 — End: 2018-02-16

## 2018-01-17 MED ORDER — APIXABAN 5 MG PO TABS: 5.0000 mg | ORAL_TABLET | Freq: Two times a day (BID) | ORAL | 2 refills | Status: DC

## 2018-01-17 MED ORDER — ACETAMINOPHEN 325 MG PO TABS: 650.0000 mg | ORAL_TABLET | Freq: Four times a day (QID) | ORAL | Status: DC | PRN

## 2018-01-17 MED ORDER — METOPROLOL SUCCINATE ER 100 MG PO TB24: 100.0000 mg | ORAL_TABLET | Freq: Two times a day (BID) | ORAL | Status: DC

## 2018-01-17 MED ORDER — DIGOXIN 125 MCG PO TABS: 125.0000 ug | ORAL_TABLET | Freq: Every day | ORAL | 2 refills | Status: DC

## 2018-01-17 MED ORDER — OXYCODONE HCL 5 MG PO TABS: 5.0000 mg | ORAL_TABLET | ORAL | 0 refills | Status: DC | PRN

## 2018-01-17 MED ORDER — AMIODARONE HCL 200 MG PO TABS: 200.0000 mg | ORAL_TABLET | Freq: Two times a day (BID) | ORAL | 1 refills | Status: DC

## 2018-01-17 MED ORDER — AMIODARONE HCL 200 MG PO TABS
200.0000 mg | ORAL_TABLET | Freq: Two times a day (BID) | ORAL | Status: DC
  Administered 2018-01-17 – 2018-01-18 (×4): 200 mg via ORAL
  Filled 2018-01-17 (×4): qty 1

## 2018-01-17 MED ORDER — AMIODARONE HCL 200 MG PO TABS: 200.0000 mg | ORAL_TABLET | Freq: Every day | ORAL | Status: DC

## 2018-01-17 MED ORDER — DIGOXIN 250 MCG PO TABS: 250.0000 ug | ORAL_TABLET | Freq: Every day | ORAL | 1 refills | Status: DC

## 2018-01-17 NOTE — Progress Notes (Addendum)
      [address]       [place], [id number]             [phone]        9 Days Post-Op Procedure(s) (LRB): BENTALL PROCEDURE (N/A) ASCENDING AORTIC ROOT REPLACEMENT (N/A) TRANSESOPHAGEAL ECHOCARDIOGRAM (TEE) (N/A) CLIPPING OF ATRIAL APPENDAGE using Atricure Clip size 40 (N/A)  Subjective: Patient sleeping and awakened.He has no specific complaints this am.  Objective: Vital signs in last 24 hours: Temp:  [97.5 F (36.4 C)-101.4 F (38.6 C)] 98.8 F (37.1 C) (08/11 0414) Pulse Rate:  [80-102] 80 (08/11 0414) Cardiac Rhythm: Atrial fibrillation (08/10 2000) Resp:  [17-27] 27 (08/11 0625) BP: (96-106)/(68-77) 106/71 (08/11 0414) SpO2:  [98 %] 98 % (08/11 0414) Weight:  [117.6 kg] 117.6 kg (08/11 0625)  Pre op weight  128.6 kg Current Weight  01/17/18 117.6 kg      Intake/Output from previous day: 08/10 0701 - 08/11 0700 In: -  Out: 2650 [Urine:2650]   Physical Exam:  Cardiovascular: IRRR IRRR Pulmonary: Mostly clear Abdomen: Soft, non tender, bowel sounds present. Extremities: No lower extremity edema. Wounds: Clean and dry.  No erythema or signs of infection.  Lab Results: CBC: Recent Labs    01/15/18 0416  WBC 11.3*  HGB 9.2*  HCT 28.4*  PLT 259   BMET:  Recent Labs    01/15/18 0416  NA 134*  K 3.8  CL 94*  CO2 28  GLUCOSE 98  BUN 13  CREATININE 1.08  CALCIUM 8.5*    PT/INR:  Lab Results  Component Value Date   INR 1.39 01/08/2018   INR 2.46 01/08/2018   INR 1.14 01/06/2018   ABG:  INR: Will add last result for INR, ABG once components are confirmed Will add last 4 CBG results once components are confirmed  Assessment/Plan:  1. CV - A fib with better rate control .   On Digoxin 0.250 mg daily, Toprol XL 100 mg bid, and Amiodarone 400 mg bid. Per Dr. Donata ClayVan Trigt, decrease Amiodarone 200 mg bid. Cardiology is following. Apixaban on hold secondary to moderate sized pericardial effusion seen on echo. He will need  repeat echo prior to discharge to re evaluate pericardial effusion. BP still too labile to restart Entresto 2.  Pulmonary - On room air. Encourage incentive spirometer 3.  Acute blood loss anemia - H and H yesterday stable at 9.2 and 28.4 4. Mildly elevated transaminases, and tbili decreased to 1.9. Amiodarone decreased. 5. Will consider possible discharge in am, pending echo results and cardiology recommendation  Lelon HuhDonielle M ZimmermanPA-C 01/17/2018,7:09 AM [phone]  Will review echo when completed CXR today reviewed- clear w/o pleural effusion Ready to go home if pericardial effusion better HR improved today patient examined and medical record reviewed,agree with above note. Kathlee Nationseter Van Trigt III 01/17/2018

## 2018-01-17 NOTE — Plan of Care (Signed)
  Problem: Education: Goal: Knowledge of General Education information will improve Description: Including pain rating scale, medication(s)/side effects and non-pharmacologic comfort measures Outcome: Progressing   Problem: Health Behavior/Discharge Planning: Goal: Ability to manage health-related needs will improve Outcome: Progressing   Problem: Clinical Measurements: Goal: Will remain free from infection Outcome: Progressing Goal: Respiratory complications will improve Outcome: Progressing Goal: Cardiovascular complication will be avoided Outcome: Progressing   Problem: Nutrition: Goal: Adequate nutrition will be maintained Outcome: Progressing   Problem: Pain Managment: Goal: General experience of comfort will improve Outcome: Progressing   

## 2018-01-17 NOTE — Progress Notes (Signed)
CT surgery  Patient feels well, A. fib heart rate controlled on current medications, ambulating in hallway with stable blood pressure.  Follow-up transthoracic echo to assess pericardial effusion shows the effusions remain small approximately 1.5 cm.  Biventricular function remains unchanged.  Patient has home arrangements for care with parents and sister.  I discussed discharge instructions with patient including wound care, diet, medications, activities and lifting limits.  He will be discharged today for outpatient follow-up.  He was instructed to resume his home dose of Eliquis tomorrow. Lovett SoxPeter Vantrigt MD

## 2018-01-17 NOTE — Care Management Note (Signed)
Case Management Note  Patient Details  Name: Caleb [name] MRN: [id number] Date of Birth: 05/11/1961  Subjective/Objective:                    Action/Plan: Pt discharging home with self care. Pt uses [facility] pharmacy for his medications, but the pharmacy is closed today. CM provided him a MATCH letter to assist with the cost of his meds. CM notified NP to please print his medications. CM also provide him a list of pharmac[facility].  Pt states his sister is going to provide transportation home.   Expected Discharge Date:  01/17/18               Expected Discharge Plan:  Home/Self Care  In-House Referral:     Discharge planning Services  [facility]  Post Acute Care Choice:    Choice offered to:     DME Arranged:    DME Agency:     HH Arranged:    HH Agency:     Status of Service:  Completed, signed off  If discussed at [employer] Meetings, dates discussed:    Additional Comments:  Kermit BaloKelli F Bonnell Placzek, RN 01/17/2018, 4:24 PM

## 2018-01-17 NOTE — Progress Notes (Signed)
Echocardiogram 2D Echocardiogram has been performed.  Pieter PartridgeBrooke S Lannette Avellino 01/17/2018, 2:04 PM

## 2018-01-17 NOTE — Progress Notes (Signed)
Progress Note  Patient Name: Caleb [name] Date of Encounter: 01/17/2018  Primary Cardiologist: Thurmon Fair, MD   Subjective   No complaints  Inpatient Medications    Scheduled Meds: . amiodarone  200 mg Oral BID  . aspirin EC  81 mg Oral Daily  . digoxin  0.25 mg Oral Daily  . docusate sodium  200 mg Oral Daily  . guaiFENesin  600 mg Oral BID  . mouth rinse  15 mL Mouth Rinse BID  . metoprolol succinate  100 mg Oral BID  . pantoprazole  40 mg Oral QAC breakfast  . sodium chloride flush  3 mL Intravenous Q12H   Continuous Infusions: . sodium chloride     PRN Meds: sodium chloride, acetaminophen, ALPRAZolam, ondansetron **OR** ondansetron (ZOFRAN) IV, oxyCODONE, sodium chloride flush, traMADol   Vital Signs    Vitals:   01/16/18 2354 01/17/18 0414 01/17/18 0625 01/17/18 0756  BP:  106/71  104/74  Pulse:  80  82  Resp: 17 (!) 25 (!) 27 20  Temp: (!) 97.5 F (36.4 C) 98.8 F (37.1 C)  99.5 F (37.5 C)  TempSrc:  Oral  Oral  SpO2:  98%  97%  Weight:   117.6 kg   Height:        Intake/Output Summary (Last 24 hours) at 01/17/2018 0917 Last data filed at 01/17/2018 0414 Gross per 24 hour  Intake -  Output 2025 ml  Net -2025 ml   Filed Weights   01/15/18 0407 01/16/18 0310 01/17/18 0625  Weight: 118.4 kg 118.3 kg 117.6 kg    Telemetry    afib rates 80 to 110s - Personally Reviewed  ECG    na  Physical Exam   GEN: No acute distress.   Neck: No JVD Cardiac: irreg, no murmurs, rubs, or gallops.  Respiratory: Clear to auscultation bilaterally. GI: Soft, nontender, non-distended  MS: No edema; No deformity. Neuro:  Nonfocal  Psych: Normal affect   Labs    Chemistry Recent Labs  Lab 01/12/18 0233 01/15/18 0416 01/17/18 0641  NA 135 134* 136  K 3.8 3.8 4.0  CL 94* 94* 95*  CO2 30 28 31   GLUCOSE 107* 98 119*  BUN 12 13 11   CREATININE 1.12 1.08 1.10  CALCIUM 8.6* 8.5* 8.6*  PROT  --  5.8* 6.3*  ALBUMIN  --  2.8* 2.7*  AST  --  60* 44*   ALT  --  68* 64*  ALKPHOS  --  42 49  BILITOT  --  2.8* 1.9*  GFRNONAA >60 >60 >60  GFRAA >60 >60 >60  ANIONGAP 11 12 10      Hematology Recent Labs  Lab 01/13/18 0302 01/14/18 0301 01/15/18 0416  WBC 10.2 10.3 11.3*  RBC 3.34* 3.17* 3.18*  HGB 9.6* 9.1* 9.2*  HCT 29.6* 28.0* 28.4*  MCV 88.6 88.3 89.3  MCH 28.7 28.7 28.9  MCHC 32.4 32.5 32.4  RDW 13.5 13.6 13.7  PLT 210 241 259    Cardiac EnzymesNo results for input(s): TROPONINI in the last 168 hours. No results for input(s): TROPIPOC in the last 168 hours.   BNPNo results for input(s): BNP, PROBNP in the last 168 hours.   DDimer No results for input(s): DDIMER in the last 168 hours.   Radiology    Dg Chest 2 View  Result Date: 01/17/2018 CLINICAL DATA:  Status post aortic valve replacement. EXAM: CHEST - 2 VIEW COMPARISON:  01/12/2018. FINDINGS: Stable enlarged cardiac silhouette, median sternotomy wires, prosthetic  aortic valve and left atrial appendage clip. Increased patchy opacity at the left lung base. Clear right lung. Small to moderate-sized left pleural effusion. Thoracic spine degenerative changes. IMPRESSION: 1. Increased left basilar atelectasis or pneumonia. 2. Small to moderate-sized left pleural effusion. 3. Stable cardiomegaly. Electronically Signed   By: Beckie SaltsSteven  Reid M.D.   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 Cardiology following for Afib RVR  Assessment & Plan    1. Afib with RVR - history of afib on eliquis at home - issues with afib with RVR this admission. Lopressor and digoxin has been titrated up - anticoag on hold due to pericardial effusion - from primary cardiology note he has a severely dilated left atrium and it is highly  unlikely we will ever restore normal rhythm - avoid dilt due to low LVEF  - yesterday morning elevated rates due to hold beta blocker dose night before. He received both doses yesterday, rates improved. Also started on amiodarone by CT surgery, Im not sure how much benefit this will have in setting of chronic afib, perhaps some mild additional rate control. If going to use would plan for short course. Not any other good options for rate control, would try slightly higher Toprol dose pending bp's. Drive for tachycardia should decrease as we get further out from operative period.    2. Pericardial effusion - 01/14/18 echo LVEF 30-35%, diffuse hypokinesis. Moderate effusion - needs repeat study prior to discharge.   - repeat echo today   3. 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   4. Chronic systolic HF - - 01/14/18 echo LVEF 30-35%, diffuse hypokinesis - coreg changed to metoprolol this adimssion due to soft bp's and need for better rate control for afib - entresto on hold due to soft bp's.     For questions or updates, please contact [facility] Please consult [email or web address] for contact info under Cardiology/STEMI.      Joanie CoddingtonSigned, Tameshia Bonneville, MD  01/17/2018, 9:17 AM

## 2018-01-17 NOTE — Progress Notes (Signed)
I returned to finish Mr. Caleb [name]'s discharge paperwork (prescriptions etc). Upon closer exam of the medical record, he had a fever to 101.4 last evening and was given Tylenol and was then low grade. His WBC from 2 days ago was slightly elevated at 11,300. Urine culture drawn 08/08 was contaminated and needs to be recollected. Blood cultures showed no growth to date. He has no signs of wound infection or pulmonary infection. As discussed with Dr. Donata ClayVan Trigt, will NOT discharge. I spoke with patient. He is upset but is willing to stay. Will check CBC in am.

## 2018-01-17 NOTE — Progress Notes (Signed)
Patient's temp 100.5 at 2000. Tylenol given at 2053. Temp 99.1 retaken at 2218. Patient denies chills or feeling feverish. Encourage patient to use IS. Will continue to monitor

## 2018-01-17 NOTE — Plan of Care (Signed)
  Problem: Nutrition: Goal: Adequate nutrition will be maintained 01/17/2018 2249 by Peggye Pitt, RN Outcome: Completed/Met 01/17/2018 2248 by Peggye Pitt, RN Reactivated

## 2018-01-18 DIAGNOSIS — I5022 Chronic systolic (congestive) heart failure: Secondary | ICD-10-CM

## 2018-01-18 LAB — CULTURE, BLOOD (ROUTINE X 2)
CULTURE: NO GROWTH
Culture: NO GROWTH
Special Requests: ADEQUATE
Special Requests: ADEQUATE

## 2018-01-18 LAB — CBC
HEMATOCRIT: 28.1 % — AB (ref 39.0–52.0)
HEMOGLOBIN: 8.9 g/dL — AB (ref 13.0–17.0)
MCH: 29.1 pg (ref 26.0–34.0)
MCHC: 31.7 g/dL (ref 30.0–36.0)
MCV: 91.8 fL (ref 78.0–100.0)
Platelets: 345 10*3/uL (ref 150–400)
RBC: 3.06 MIL/uL — AB (ref 4.22–5.81)
RDW: 14.6 % (ref 11.5–15.5)
WBC: 9.2 10*3/uL (ref 4.0–10.5)

## 2018-01-18 MED ORDER — DIGOXIN 125 MCG PO TABS: 0.1250 mg | ORAL_TABLET | Freq: Every day | ORAL | Status: DC

## 2018-01-18 MED ORDER — DIGOXIN 125 MCG PO TABS: 0.1250 mg | ORAL_TABLET | Freq: Every day | ORAL | 1 refills | Status: DC

## 2018-01-18 MED FILL — DIGITEK 125 MCG TABLET: 125 | 30 days supply | Qty: 30 | Fill #1

## 2018-01-18 NOTE — Progress Notes (Signed)
Pt discharged with instructions and all paper prescriptions. Evening meds provided due to pharmacy closing. Pt has all belongings. Tx via wheelchair to [employer] to meet sister. Lacy DuverneyJennifer Kavin Weckwerth, RN

## 2018-01-18 NOTE — Progress Notes (Signed)
CARDIAC REHAB PHASE I   PRE:  Rate/Rhythm: 82 Afib  BP:  Sitting: 101/83      SaO2: 99 RA  MODE:  Ambulation: >1000 ft       108 peak HR  POST:  Rate/Rhythm: 88 Afib  BP:  Sitting: 140/90    SaO2: 98 RA   Pt ambulated >107400ft in hallway to [employer] independently with steady gait. Pt states he can "feel" the walk. Pt encouraged to listen to his body, and not push himself.  Pt in better mood once returned to room, still down that he is not going home today.  [id number] Caleb Boweneresa  Caleb Macphee, RN BSN 01/18/2018 10:38 AM

## 2018-01-18 NOTE — Progress Notes (Signed)
Progress Note  Patient Name: Caleb [name] Date of Encounter: 01/18/2018  Primary Cardiologist: Thurmon Fair, MD   Subjective   Feels great and is eager to go home. Walked 1000 ft.  Ventricular rate 70-80s at rest , max 105-110 with activity. Low grade fever. No problems with hypotension or signs of low cardiac output. Echo shows no change in pericardial effusion, moderate, anterior mostly.  Atelectasis on CXR. Denies cough, dyspnea or pleuritic pain. Denies nausea or abdominal pain. LFTs mildly abnormal, but improving.  Inpatient Medications    Scheduled Meds: . amiodarone  200 mg Oral BID  . aspirin EC  81 mg Oral Daily  . digoxin  0.25 mg Oral Daily  . docusate sodium  200 mg Oral Daily  . guaiFENesin  600 mg Oral BID  . mouth rinse  15 mL Mouth Rinse BID  . metoprolol succinate  100 mg Oral BID  . pantoprazole  40 mg Oral QAC breakfast  . sodium chloride flush  3 mL Intravenous Q12H   Continuous Infusions: . sodium chloride     PRN Meds: sodium chloride, acetaminophen, ALPRAZolam, ondansetron **OR** ondansetron (ZOFRAN) IV, oxyCODONE, sodium chloride flush, traMADol   Vital Signs    Vitals:   01/18/18 0600 01/18/18 0828 01/18/18 1013 01/18/18 1032  BP:  124/83    Pulse:  84  72  Resp: 15     Temp:   99.4 F (37.4 C)   TempSrc:   Oral   SpO2:      Weight:      Height:        Intake/Output Summary (Last 24 hours) at 01/18/2018 1119 Last data filed at 01/17/2018 2339 Gross per 24 hour  Intake -  Output 975 ml  Net -975 ml   Filed Weights   01/16/18 0310 01/17/18 0625 01/18/18 0548  Weight: 118.3 kg 117.6 kg 117.4 kg    Telemetry    AFib , rate controlled - Personally Reviewed  ECG    No new tracing - Personally Reviewed  Physical Exam  Appears well GEN: No acute distress.   Neck: No JVD Cardiac: irregular, no murmurs, rubs, or gallops.  Respiratory: Reduced breath sounds in both bases, otherwise clear to auscultation bilaterally. GI:  Soft, nontender, non-distended  MS: No edema; No deformity. Neuro:  Nonfocal  Psych: Normal affect   Labs    Chemistry Recent Labs  Lab 01/12/18 0233 01/15/18 0416 01/17/18 0641  NA 135 134* 136  K 3.8 3.8 4.0  CL 94* 94* 95*  CO2 30 28 31   GLUCOSE 107* 98 119*  BUN 12 13 11   CREATININE 1.12 1.08 1.10  CALCIUM 8.6* 8.5* 8.6*  PROT  --  5.8* 6.3*  ALBUMIN  --  2.8* 2.7*  AST  --  60* 44*  ALT  --  68* 64*  ALKPHOS  --  42 49  BILITOT  --  2.8* 1.9*  GFRNONAA >60 >60 >60  GFRAA >60 >60 >60  ANIONGAP 11 12 10      Hematology Recent Labs  Lab 01/14/18 0301 01/15/18 0416 01/18/18 0219  WBC 10.3 11.3* 9.2  RBC 3.17* 3.18* 3.06*  HGB 9.1* 9.2* 8.9*  HCT 28.0* 28.4* 28.1*  MCV 88.3 89.3 91.8  MCH 28.7 28.9 29.1  MCHC 32.5 32.4 31.7  RDW 13.6 13.7 14.6  PLT 241 259 345    Cardiac EnzymesNo results for input(s): TROPONINI in the last 168 hours. No results for input(s): TROPIPOC in the last 168 hours.  BNPNo results for input(s): BNP, PROBNP in the last 168 hours.   DDimer No results for input(s): DDIMER in the last 168 hours.   Radiology    Dg Chest 2 View  Result Date: 01/17/2018 CLINICAL DATA:  Status post aortic valve replacement. EXAM: CHEST - 2 VIEW COMPARISON:  01/12/2018. FINDINGS: Stable enlarged cardiac silhouette, median sternotomy wires, prosthetic aortic valve and left atrial appendage clip. Increased patchy opacity at the left lung base. Clear right lung. Small to moderate-sized left pleural effusion. Thoracic spine degenerative changes. IMPRESSION: 1. Increased left basilar atelectasis or pneumonia. 2. Small to moderate-sized left pleural effusion. 3. Stable cardiomegaly. Electronically Signed   By: Beckie SaltsSteven  Reid M.D.   On: 01/17/2018 08:45    Cardiac Studies   ECHO 01/14/2018 - Left ventricle: The cavity size was normal. Wall thickness was   increased in a pattern of moderate LVH. Systolic function was   moderately to severely reduced. The  estimated ejection fraction   was in the range of 30% to 35%. Diffuse hypokinesis. The study is   not technically sufficient to allow evaluation of LV diastolic   function. - Aortic valve: s/p 27 mm bioprosthetic Edwards Magna-Ease valve.   No obstruction, well-seated. No paravalvular leak. Valve area   (VTI): 2.85 cm^2. Valve area (Vmax): 2.74 cm^2. Valve area   (Vmean): 2.49 cm^2. - Aorta: s/p Bentall repair. Aortic root dimension: 33 mm (ED). - Aortic root: The aortic root was normal in size. - Mitral valve: Mildly thickened leaflets . There was trivial   regurgitation. - Left atrium: Moderately dilated. - Tricuspid valve: There was moderate regurgitation. - Systemic veins: Not visualized. - Pericardium, extracardiac: Moderate pericardial effusion. There   was a left pleural effusion. Unable to fully evaluate for   tamponade physiology.  Impressions:  - Compared to a prior study in 07/2017, the LVEF may be slightly   higher at 30-35%. The bioprosthetic aortic valve appears to be   functioning normally. There is a moderate-sized pericardial   effusion and left pleural effusion. The aortic root repair size   is normal.  ECHO 01/17/2018 - Left ventricle: The cavity size was normal. Wall thickness was   normal. Systolic function was severely reduced. The estimated   ejection fraction was in the range of 25% to 30%. Diffuse   hypokinesis. The study was not technically sufficient to allow   evaluation of LV diastolic dysfunction due to atrial   fibrillation. - Aortic valve: Bioprosthetic aortic valve. - Aorta: S/p Bentall procedure. - Systemic veins: IVC is poorly visualized. - Pericardium, extracardiac: Moderate-sized pericardial effusion   primarily anteriorly/adjacent to the RV. No definite tamponade   but full evaluation was not done. Pleural effusion noted on left.  Impressions:  - Limited study with no doppler. There is a moderate pericardial   effusion anteriorly, no  definite tamponade but respirometry was   not done and IVC was poorly visualized.  Patient Profile     57 y.o. male s/p Bentall Ao root repair w bioprosthetic AVR #27, on background of chronic atrial fibrillation and chronic systolic heart failure due to nonischemic cardiomyopathy.  Assessment & Plan    Probable DC today.  [facility] will sign off.   Medication Recommendations:  Amiodarone 200 mg twice daily for 2 weeks, then once daily. Reduce digoxin to 0.125 mg daily due to amiodarone interaction. Other recommendations (labs, testing, etc):  Repeat CMET in one week.  Follow up as an outpatient:  Office follow up in 2  weeks.  For questions or updates, please contact [facility] Please consult [email or web address] for contact info under Cardiology/STEMI.      Signed, Thurmon FairMihai Berlie Persky, MD  01/18/2018, 11:19 AM

## 2018-01-18 NOTE — Progress Notes (Signed)
[address]       [place] [address]             [phone]      10 Days Post-Op Procedure(s) (LRB): BENTALL PROCEDURE (N/A) ASCENDING AORTIC ROOT REPLACEMENT (N/A) TRANSESOPHAGEAL ECHOCARDIOGRAM (TEE) (N/A) CLIPPING OF ATRIAL APPENDAGE using Atricure Clip size 40 (N/A) Subjective: Tired this am, otherwise feels ok  Objective: Vital signs in last 24 hours: Temp:  [98.7 F (37.1 C)-100.5 F (38.1 C)] 98.7 F (37.1 C) (08/12 0548) Pulse Rate:  [30-83] 82 (08/12 0548) Cardiac Rhythm: Atrial fibrillation (08/11 2035) Resp:  [10-22] 15 (08/12 0600) BP: (104-125)/(71-78) 113/71 (08/12 0548) SpO2:  [95 %-100 %] 96 % (08/12 0548) Weight:  [117.4 kg] 117.4 kg (08/12 0548)  Hemodynamic parameters for last 24 hours:    Intake/Output from previous day: 08/11 0701 - 08/12 0700 In: -  Out: 975 [Urine:975] Intake/Output this shift: No intake/output data recorded.  General appearance: alert, cooperative and no distress Heart: irregularly irregular rhythm and soft systolic murmur Lungs: dim ledt base Abdomen: benign Extremities: no edema Wound: incis healing well  Lab Results: Recent Labs    01/18/18 0219  WBC 9.2  HGB 8.9*  HCT 28.1*  PLT 345   BMET:  Recent Labs    01/17/18 0641  NA 136  K 4.0  CL 95*  CO2 31  GLUCOSE 119*  BUN 11  CREATININE 1.10  CALCIUM 8.6*    PT/INR: No results for input(s): LABPROT, INR in the last 72 hours. ABG    Component Value Date/Time   PHART 7.344 (L) 01/08/2018 2127   HCO3 26.2 01/08/2018 2127   TCO2 23 01/09/2018 1739   ACIDBASEDEF 2.0 01/08/2018 2110   O2SAT 99.0 01/08/2018 2127   CBG (last 3)  No results for input(s): GLUCAP in the last 72 hours.  Meds Scheduled Meds: . amiodarone  200 mg Oral BID  . aspirin EC  81 mg Oral Daily  . digoxin  0.25 mg Oral Daily  . docusate sodium  200 mg Oral Daily  . guaiFENesin  600 mg Oral BID  . mouth rinse  15 mL Mouth Rinse BID  . metoprolol  succinate  100 mg Oral BID  . pantoprazole  40 mg Oral QAC breakfast  . sodium chloride flush  3 mL Intravenous Q12H   Continuous Infusions: . sodium chloride     PRN Meds:.sodium chloride, acetaminophen, ALPRAZolam, ondansetron **OR** ondansetron (ZOFRAN) IV, oxyCODONE, sodium chloride flush, traMADol  Xrays Dg Chest 2 View  Result Date: 01/17/2018 CLINICAL DATA:  Status post aortic valve replacement. EXAM: CHEST - 2 VIEW COMPARISON:  01/12/2018. FINDINGS: Stable enlarged cardiac silhouette, median sternotomy wires, prosthetic aortic valve and left atrial appendage clip. Increased patchy opacity at the left lung base. Clear right lung. Small to moderate-sized left pleural effusion. Thoracic spine degenerative changes. IMPRESSION: 1. Increased left basilar atelectasis or pneumonia. 2. Small to moderate-sized left pleural effusion. 3. Stable cardiomegaly. Electronically Signed   By: Claudie Revering M.D.   On: 01/17/2018 08:45    Results for orders placed or performed during the hospital encounter of 01/08/18  Culture, blood (routine x 2)     Status: None (Preliminary result)   Collection Time: 01/13/18  6:33 PM  Result Value Ref Range Status   Specimen Description BLOOD RIGHT ANTECUBITAL  Final   Special Requests   Final    BOTTLES DRAWN AEROBIC AND ANAEROBIC Blood Culture adequate volume   Culture  Final    NO GROWTH 4 DAYS Performed at [facility], [place] [address] [place], [place] [address]    Report Status PENDING  Incomplete  Culture, blood (routine x 2)     Status: None (Preliminary result)   Collection Time: 01/13/18  6:41 PM  Result Value Ref Range Status   Specimen Description BLOOD LEFT ANTECUBITAL  Final   Special Requests   Final    BOTTLES DRAWN AEROBIC ONLY Blood Culture adequate volume   Culture   Final    NO GROWTH 4 DAYS Performed at [facility], [place] [address], [place], [place] [address]    Report Status PENDING  Incomplete  Urine Culture     Status:  Abnormal   Collection Time: 01/14/18  7:02 AM  Result Value Ref Range Status   Specimen Description URINE, RANDOM  Final   Special Requests   Final    Normal Performed at [facility], [place] [address] [place], [place] [address]    Culture MULTIPLE SPECIES PRESENT, SUGGEST RECOLLECTION (A)  Final   Report Status 01/15/2018 FINAL  Final   Assessment/Plan: S/P Procedure(s) (LRB): BENTALL PROCEDURE (N/A) ASCENDING AORTIC ROOT REPLACEMENT (N/A) TRANSESOPHAGEAL ECHOCARDIOGRAM (TEE) (N/A) CLIPPING OF ATRIAL APPENDAGE using Atricure Clip size 40 (N/A)  1 conts to look well, T max 100.5, WBC 9.2, UA ok, CXR shows some increase atx in  base vs pneumonia per radiologist  Report. Appears more like atx. But with fever some concern.  2.  rhythm is afib with stable rate- some increase in LFT's yesterday, T.Bili 1.9 today but was 2.8- may need to consider GB workup with low grade fevers. He is asymptomatic in terms of abdominal sx  - will repeat in am, alk phos is normal. Will also see what cardiology has to say. 3 H/H is stable 4 sugars controlled 5 push rehab/pulm toilet- routine  LOS: 10 days    Caleb [name] 01/18/2018

## 2018-01-19 LAB — URINE CULTURE: Culture: <10000 — AB

## 2018-01-19 MED FILL — METOPROLOL SUCCINATE ER 100: 100 | 30 days supply | Qty: 60 | Fill #0

## 2018-01-19 MED FILL — !ELIQUIS 5 MG TABLET: 5 | 30 days supply | Qty: 60 | Fill #1

## 2018-01-19 MED FILL — AMIODARONE HCL 200 MG TAB: 200 | 23 days supply | Qty: 30 | Fill #0

## 2018-01-21 ENCOUNTER — Other Ambulatory Visit: Payer: Self-pay

## 2018-01-21 ENCOUNTER — Ambulatory Visit (INDEPENDENT_AMBULATORY_CARE_PROVIDER_SITE_OTHER): Payer: Self-pay

## 2018-01-21 ENCOUNTER — Other Ambulatory Visit: Payer: Self-pay | Admitting: *Deleted

## 2018-01-21 DIAGNOSIS — Z4802 Encounter for removal of sutures: Secondary | ICD-10-CM

## 2018-01-21 MED ORDER — FUROSEMIDE 40 MG PO TABS: 40.0000 mg | ORAL_TABLET | Freq: Every day | ORAL | 0 refills | Status: DC

## 2018-01-21 MED ORDER — POTASSIUM CHLORIDE CRYS ER 10 MEQ PO TBCR: 20.0000 meq | EXTENDED_RELEASE_TABLET | Freq: Every day | ORAL | 1 refills | Status: DC

## 2018-01-21 NOTE — Progress Notes (Signed)
Patient arrived for nurse visit to remove 2 sutures post- procedure Bentall with Dr. Laneta SimmersBartle on 01/08/2018.  Sutures removed with no signs/ symptoms of infection noted. Incision looked scabbed over and closed. Patient tolerated procedure well.  Patient instructed to keep the incision sites clean and dry.  Patient acknowledged instructions given.   Patient did have some concerns about weight gain.  He stated that he went from 252- 257 over night, 5 pounds.  He stated that he was not short of breath, he stated he did not have any swelling, although I did notice minimal 1+ dependant edema.  Patient stated that he was on Lasix but it was stopped and was somewhat concerned.  He was not in any distress and seemed to be doing well.  I advised the patient that if he became severely short of breath and unable to catch his breath he needed to go to the emergency room.  But, that I would let Dr. Laneta SimmersBartle or PA know about these findings.  He acknowledged receipt.  Will continue to monitor.

## 2018-01-23 LAB — CULTURE, BLOOD (ROUTINE X 2)
Culture: NO GROWTH
Culture: NO GROWTH
Special Requests: ADEQUATE
Special Requests: ADEQUATE

## 2018-01-25 ENCOUNTER — Other Ambulatory Visit (HOSPITAL_COMMUNITY): Payer: Self-pay | Admitting: *Deleted

## 2018-01-25 DIAGNOSIS — Z8679 Personal history of other diseases of the circulatory system: Secondary | ICD-10-CM

## 2018-01-25 DIAGNOSIS — Z9889 Other specified postprocedural states: Secondary | ICD-10-CM

## 2018-01-25 DIAGNOSIS — Z952 Presence of prosthetic heart valve: Secondary | ICD-10-CM

## 2018-01-27 ENCOUNTER — Telehealth (HOSPITAL_COMMUNITY): Payer: Self-pay

## 2018-01-27 NOTE — Telephone Encounter (Signed)
Called and spoke with patient in regards to cardiac rehab - patient not interested. Closed referral.

## 2018-02-01 ENCOUNTER — Ambulatory Visit (INDEPENDENT_AMBULATORY_CARE_PROVIDER_SITE_OTHER): Payer: Self-pay | Admitting: Cardiology

## 2018-02-01 ENCOUNTER — Encounter: Payer: Self-pay | Admitting: Cardiology

## 2018-02-01 VITALS — BP 106/72 | HR 65 | Ht 75.0 in | Wt 258.4 lb

## 2018-02-01 DIAGNOSIS — Z8679 Personal history of other diseases of the circulatory system: Secondary | ICD-10-CM

## 2018-02-01 DIAGNOSIS — I420 Dilated cardiomyopathy: Secondary | ICD-10-CM

## 2018-02-01 DIAGNOSIS — Z9889 Other specified postprocedural states: Secondary | ICD-10-CM

## 2018-02-01 DIAGNOSIS — I481 Persistent atrial fibrillation: Secondary | ICD-10-CM

## 2018-02-01 DIAGNOSIS — I4819 Other persistent atrial fibrillation: Secondary | ICD-10-CM

## 2018-02-01 DIAGNOSIS — Z7901 Long term (current) use of anticoagulants: Secondary | ICD-10-CM

## 2018-02-01 NOTE — Assessment & Plan Note (Signed)
Tolerating this well- will review his medications with Dr Royann Shiversroitoru

## 2018-02-01 NOTE — Assessment & Plan Note (Signed)
Eliquis 5 mg BID 
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
## 2018-02-01 NOTE — Patient Instructions (Addendum)
Medication Instructions:  Your physician recommends that you continue on your current medications as directed. Please refer to the Current Medication list given to you today.  RESUME Lasix 40mg  daily and Potassium 20mEq daily  Labwork: Your physician recommends that you return for lab work prior to your appointment with Dr.Croitoru (Cmet and Tsh)   Testing/Procedures: None ordered  Follow-Up: Your physician recommends that you schedule a follow-up appointment in: 2 months with Dr.Croitoru   Any Other Special Instructions Will Be Listed Below (If Applicable).     If you need a refill on your cardiac medications before your next appointment, please call your pharmacy.

## 2018-02-01 NOTE — Assessment & Plan Note (Signed)
EF 25% pre and post Bentall. Normal coronaries July 2019

## 2018-02-01 NOTE — Assessment & Plan Note (Signed)
Bentall and LA clipping 01/08/18

## 2018-02-02 MED FILL — POTASSIUM CL 10 MEQ TAB SA: 10 | 5 days supply | Qty: 10 | Fill #0

## 2018-02-02 MED FILL — ?FUROSEMIDE 40 MG TABLET: 40 | 5 days supply | Qty: 5 | Fill #0

## 2018-02-03 ENCOUNTER — Telehealth: Payer: Self-pay

## 2018-02-03 ENCOUNTER — Telehealth: Payer: Self-pay | Admitting: Cardiovascular Disease

## 2018-02-03 DIAGNOSIS — I4891 Unspecified atrial fibrillation: Secondary | ICD-10-CM

## 2018-02-03 NOTE — Progress Notes (Signed)
Heart rate is well-controlled. Please ask him to cut back amiodarone to 100 mg daily and will DC altogether if he remains well controlled.  Doubt his EF will change much, but an echo 2-3 months post op will be useful to see his baseline AV prosthesis gradients. Thanks [employer]

## 2018-02-03 NOTE — Telephone Encounter (Signed)
Left message for patient to contact office.

## 2018-02-03 NOTE — Progress Notes (Signed)
Please let Mr Caleb [name] know I reviewed his medications with Dr Royann Shiversroitoru. He can cut his Amiodarone to 100 mg daily. Check an echo in 2 months and f/u with Dr Royann Shiversroitoru after that.   Thanks  Corine ShelterLUKE Araina Butrick PA-C 02/03/2018 3:50 PM

## 2018-02-03 NOTE — Telephone Encounter (Signed)
Left message to call back  

## 2018-02-03 NOTE — Telephone Encounter (Signed)
New Message: ° ° ° °Patient returning a call  °

## 2018-02-03 NOTE — Telephone Encounter (Signed)
Please let Mr Chirico know I reviewed his medications with Dr Croitoru. He can cut his Amiodarone to 100 mg daily. Check an echo in 2 months and f/u with Dr Croitoru after that.   Thanks  LUKE KILROY PA-C 02/03/2018 3:50 PM  

## 2018-02-04 MED ORDER — POTASSIUM CHLORIDE CRYS ER 20 MEQ PO TBCR: 20.0000 meq | EXTENDED_RELEASE_TABLET | Freq: Two times a day (BID) | ORAL | 5 refills | Status: DC

## 2018-02-04 MED ORDER — FUROSEMIDE 40 MG PO TABS: 40.0000 mg | ORAL_TABLET | Freq: Every day | ORAL | 5 refills | Status: DC

## 2018-02-04 MED ORDER — AMIODARONE HCL 100 MG PO TABS: 100.0000 mg | ORAL_TABLET | Freq: Every day | ORAL | 5 refills | Status: DC

## 2018-02-04 MED FILL — AMIODARONE HCL 100 MG TABS: 100 | 30 days supply | Qty: 30 | Fill #0

## 2018-02-04 NOTE — Telephone Encounter (Signed)
°*  STAT* If patient is at the pharmacy, call can be transferred to refill team.   1. Which medications need to be refilled? (please list name of each medication and dose if known) Amiodarone need 100 mg instead of 200 mg Laxtis 40gm potasium 20gm   2. Which pharmacy/location (including street and city if local pharmacy) is medication to be sent to? [facility] [place] [facility] [place]  3. Do they need a 30 day or 90 day supply? 30

## 2018-02-04 NOTE — Telephone Encounter (Signed)
Rx sent to pharmacy waiting for patient to contact office to discuss [facility] and Dr Croitoru's recommendations.

## 2018-02-04 NOTE — Telephone Encounter (Signed)
Left message for patient to contact office.

## 2018-02-09 ENCOUNTER — Other Ambulatory Visit: Payer: Self-pay | Admitting: Surgery

## 2018-02-09 DIAGNOSIS — Z952 Presence of prosthetic heart valve: Secondary | ICD-10-CM

## 2018-02-09 MED FILL — POTASSIUM CL ER 20 MEQ TAB: 20 | 30 days supply | Qty: 60 | Fill #0

## 2018-02-10 ENCOUNTER — Ambulatory Visit (INDEPENDENT_AMBULATORY_CARE_PROVIDER_SITE_OTHER): Payer: Self-pay | Admitting: Surgery

## 2018-02-10 ENCOUNTER — Ambulatory Visit
Admission: RE | Admit: 2018-02-10 | Discharge: 2018-02-10 | Disposition: A | Discharge status: Home / Self Care | Payer: No Typology Code available for payment source | Source: Ambulatory Visit | Attending: Surgery | Admitting: Surgery

## 2018-02-10 ENCOUNTER — Encounter: Payer: Self-pay | Admitting: Surgery

## 2018-02-10 ENCOUNTER — Other Ambulatory Visit: Payer: Self-pay

## 2018-02-10 VITALS — BP 132/84 | HR 66 | Resp 18 | Ht 75.0 in | Wt 256.0 lb

## 2018-02-10 DIAGNOSIS — Z952 Presence of prosthetic heart valve: Secondary | ICD-10-CM

## 2018-02-10 DIAGNOSIS — Z9889 Other specified postprocedural states: Secondary | ICD-10-CM

## 2018-02-10 DIAGNOSIS — Z8679 Personal history of other diseases of the circulatory system: Secondary | ICD-10-CM

## 2018-02-11 ENCOUNTER — Telehealth: Payer: Self-pay | Admitting: Cardiology

## 2018-02-11 ENCOUNTER — Encounter: Payer: Self-pay | Admitting: Surgery

## 2018-02-11 NOTE — Telephone Encounter (Signed)
New Message    Pt c/o medication issue:  1. Name of Medication: metoprolol succinate (TOPROL-XL) 100 MG 24 hr tablet  2. How are you currently taking this medication (dosage and times per day)? Take 1 tablet (100 mg total) by mouth 2 (two) times daily. Take with or immediately following a meal.  3. Are you having a reaction (difficulty breathing--STAT)? no  4. What is your medication issue? Pt states that Dr. [name] wants him to lower his dosage and the pt wants to check with Dr. Royann Shivers. Please call

## 2018-02-11 NOTE — Progress Notes (Signed)
HPI: Patient returns for routine postoperative follow-up having undergone replacement of an ascending aortic aneurysm under circulatory arrest with biological Bentall procedure using a Magna-Ease pericardial valve with placement of a clip on the left atrial appendage on 01/08/2018. The patient's early postoperative recovery while in the hospital was notable for chronic persistent atrial fibrillation with difficult to control tachycardia.  He was treated with Toprol XL 100 mg twice daily, digoxin 0.125 mg daily, and amiodarone 100 mg daily to achieve adequate rate control. Since hospital discharge the patient reports that he is felt well.  He has been walking up to 6 miles per day without any chest pain or shortness of breath.  He said that his stamina is much improved..   Current Outpatient Medications  Medication Sig Dispense Refill  . acetaminophen (TYLENOL) 325 MG tablet Take 2 tablets (650 mg total) by mouth every 6 (six) hours as needed for mild pain.    Marland Kitchen amiodarone (PACERONE) 100 MG tablet Take 1 tablet (100 mg total) by mouth daily. 30 tablet 5  . apixaban (ELIQUIS) 5 MG TABS tablet Take 1 tablet (5 mg total) by mouth 2 (two) times daily. 60 tablet 2  . Cholecalciferol (VITAMIN D PO) Take 5,000 Units by mouth daily.     . digoxin (LANOXIN) 0.125 MG tablet Take 1 tablet (0.125 mg total) by mouth daily. 30 tablet 1  . furosemide (LASIX) 40 MG tablet Take 1 tablet (40 mg total) by mouth daily. 30 tablet 5  . metoprolol succinate (TOPROL-XL) 100 MG 24 hr tablet Take 1 tablet (100 mg total) by mouth 2 (two) times daily. Take with or immediately following a meal. 60 tablet 1  . OVER THE COUNTER MEDICATION Take 2 tablets by mouth daily. Noopept    . potassium chloride SA (K-DUR,KLOR-CON) 20 MEQ tablet Take 1 tablet (20 mEq total) by mouth 2 (two) times daily. 60 tablet 5   No current facility-administered medications for this visit.     Physical Exam: BP 132/84 (BP Location: Left Arm,  Patient Position: Sitting, Cuff Size: Normal)   Pulse 66   Resp 18   Ht 6\' 3"  (1.905 m)   Wt 256 lb (116.1 kg)   SpO2 98% Comment: RA  BMI 32.00 kg/m  He looks well. Lung exam is clear. Cardiac exam shows a irregular rate and rhythm with normal heart sounds.  There is no murmur. Chest incision is healing well and sternum is stable. There is no peripheral edema.   Diagnostic Tests:  CLINICAL DATA:  Pt c/o S/P AVR 01/08/18. Pt has no complaints. Hx of AAA, CHF, HTN, stroke.  EXAM: CHEST - 2 VIEW  COMPARISON:  01/17/2018  FINDINGS: Since the prior exam, the opacity at the left lung base has resolved. Lungs are now clear.  Cardiac silhouette is mildly enlarged. Changes from the recent cardiac surgery are stable. No mediastinal widening, masses or adenopathy.  No pneumothorax or pleural effusion.  IMPRESSION: 1. Resolved left lung base opacity since the prior study. Lungs are now clear. 2. No acute cardiopulmonary disease.   Electronically Signed   By: Amie Portland M.D.   On: 02/10/2018 12:40  Impression:  Overall he is making an excellent recovery following his surgery.  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  He is going to continue following up with cardiology and I will plan to see him in 1 year with  a CTA of the chest to follow-up on the remainder of his aorta.  If the remainder of his aorta is normal size and he will not require long-term follow-up for this.  Plan:  He will follow-up with cardiology and I will see him back in 1 year with a CTA of the chest.   Alleen Borne, MD [facility] [phone]

## 2018-02-11 NOTE — Telephone Encounter (Signed)
Spoke with pt, he reports dr [name] told him decreasing the metoprolol to 50 mg would help with his fatigue and the patient wants to make sure dr croitoru is okay with the change. Will forward to dr c to review and advise.

## 2018-02-12 NOTE — Telephone Encounter (Signed)
The fatigue is related to the anemia. Until the anemia resolves (2-3 months), I agree we should TEMPORARILY reduce the metoprolol to 50 mg twice daily.When is his follow up appt here? MCr

## 2018-02-16 MED ORDER — METOPROLOL SUCCINATE ER 50 MG PO TB24: 50.0000 mg | ORAL_TABLET | Freq: Two times a day (BID) | ORAL | 1 refills | Status: DC

## 2018-02-16 MED FILL — METOPROLOL SUCCINATE ER 50: 50 | 30 days supply | Qty: 60 | Fill #0

## 2018-02-16 NOTE — Telephone Encounter (Signed)
Advised patient of MD recommendations. Per MCr - also needs an APP appt in 1 month. Patient verbalized understanding and agreed with plan. Patient requested a new prescription be sent to his preferred pharmacy. Rx(s) sent to pharmacy electronically.

## 2018-02-17 ENCOUNTER — Telehealth: Payer: Self-pay | Admitting: *Deleted

## 2018-02-17 NOTE — Telephone Encounter (Signed)
Left message for patient to call and schedule 1 month follow up appointment with APP per Dr. Royann Shivers.;

## 2018-02-19 NOTE — Telephone Encounter (Signed)
Left message for patient to call and schedule 1 month APP appt per Dr. Salena Saner

## 2018-02-25 NOTE — Telephone Encounter (Signed)
Patient has not returned calls to schedule 1 month follow up visit

## 2018-03-01 MED FILL — DIGITEK 125 MCG TABLET: 125 | 30 days supply | Qty: 30 | Fill #2

## 2018-03-09 MED FILL — FUROSEMIDE 40 MG TAB: 40 | 30 days supply | Qty: 30 | Fill #0

## 2018-03-09 MED FILL — AMIODARONE HCL 100 MG TABS: 100 | 30 days supply | Qty: 30 | Fill #1

## 2018-03-17 ENCOUNTER — Other Ambulatory Visit: Payer: Self-pay | Admitting: Cardiovascular Disease

## 2018-03-26 ENCOUNTER — Telehealth: Payer: Self-pay | Admitting: Cardiovascular Disease

## 2018-03-26 NOTE — Telephone Encounter (Signed)
New Message:       Patient calling the office for samples of medication:   1.  What medication and dosage are you requesting samples for? Eliquis  2.  Are you currently out of this medication? yes  Pt states he is completely out of this medication and he is awaiting some paperwork in the mail to continue his scholarship for this medication.

## 2018-03-26 NOTE — Telephone Encounter (Signed)
Called patient and advised that samples were here to pick up.  Medication given:Eliquis  Lot #: [id number] Expiration: 04/2020 Qty: 3boxes

## 2018-03-31 ENCOUNTER — Telehealth: Payer: Self-pay

## 2018-03-31 DIAGNOSIS — I639 Cerebral infarction, unspecified: Secondary | ICD-10-CM

## 2018-03-31 MED ORDER — APIXABAN 5 MG PO TABS: 5.0000 mg | ORAL_TABLET | Freq: Two times a day (BID) | ORAL | 3 refills | Status: DC

## 2018-04-07 ENCOUNTER — Other Ambulatory Visit: Payer: Self-pay | Admitting: *Deleted

## 2018-04-07 NOTE — Telephone Encounter (Signed)
Patient assistance application submitted to [employer] on 04/01/18. Patient was approved for assistance on 04/02/18.  Patient verbalized understanding and voiced appreciation for my help.

## 2018-04-19 ENCOUNTER — Ambulatory Visit: Payer: No Typology Code available for payment source | Admitting: Family Medicine

## 2018-04-19 ENCOUNTER — Ambulatory Visit: Payer: No Typology Code available for payment source | Admitting: Cardiovascular Disease

## 2018-04-21 ENCOUNTER — Other Ambulatory Visit (HOSPITAL_COMMUNITY): Payer: No Typology Code available for payment source

## 2018-04-22 ENCOUNTER — Ambulatory Visit: Payer: No Typology Code available for payment source | Admitting: Cardiovascular Disease

## 2018-04-23 ENCOUNTER — Encounter: Payer: Self-pay | Admitting: Cardiovascular Disease

## 2018-06-29 MED FILL — POTASSIUM CL ER 20 MEQ TAB: 20 | 30 days supply | Qty: 60 | Fill #1

## 2018-08-09 ENCOUNTER — Telehealth: Payer: Self-pay | Admitting: Cardiovascular Disease

## 2018-08-09 MED ORDER — DIGOXIN 125 MCG PO TABS: 0.1250 mg | ORAL_TABLET | Freq: Every day | ORAL | 4 refills | Status: DC

## 2018-08-09 NOTE — Telephone Encounter (Signed)
Rx(s) sent to pharmacy electronically.  

## 2018-08-09 NOTE — Telephone Encounter (Signed)
°*  STAT* If patient is at the pharmacy, call can be transferred to refill team.   1. Which medications need to be refilled? (please list name of each medication and dose if known) digoxin (LANOXIN) 0.125 MG tablet  2. Which pharmacy/location (including street and city if local pharmacy) is medication to be sent to? [facility] [place], [place], [facility] [address] [phone]  3. Do they need a 30 day or 90 day supply? 30 days  Patient is out meds.

## 2018-08-09 NOTE — Addendum Note (Signed)
Addended by: Neta Ehlers on: 08/09/2018 04:38 PM   Modules accepted: Orders

## 2018-08-12 ENCOUNTER — Other Ambulatory Visit: Payer: Self-pay | Admitting: Cardiovascular Disease

## 2018-08-12 NOTE — Telephone Encounter (Signed)
°*  STAT* If patient is at the pharmacy, call can be transferred to refill team.   1. Which medications need to be refilled? (please list name of each medication and dose if known) New prescription for Metoprolol- changing pharmacy  2. Which pharmacy/location (including street and city if local pharmacy) is medication to be sent to? [facility]- [address] [id number]- [phone] is the number to the store)  3. Do they need a 30 day or 90 day supply? 90 and refills

## 2018-08-13 MED ORDER — METOPROLOL SUCCINATE ER 50 MG PO TB24: 50.0000 mg | ORAL_TABLET | Freq: Two times a day (BID) | ORAL | 1 refills | Status: DC

## 2018-08-13 NOTE — Telephone Encounter (Signed)
Rx(s) sent to pharmacy electronically.  

## 2018-08-26 ENCOUNTER — Other Ambulatory Visit: Payer: Self-pay | Admitting: Cardiovascular Disease

## 2018-08-26 MED ORDER — POTASSIUM CHLORIDE CRYS ER 20 MEQ PO TBCR: 20.0000 meq | EXTENDED_RELEASE_TABLET | Freq: Every day | ORAL | 5 refills | Status: DC

## 2018-08-26 MED ORDER — DIGOXIN 125 MCG PO TABS: 0.1250 mg | ORAL_TABLET | Freq: Every day | ORAL | 5 refills | Status: DC

## 2018-08-26 MED ORDER — FUROSEMIDE 40 MG PO TABS: 40.0000 mg | ORAL_TABLET | Freq: Every day | ORAL | 5 refills | Status: DC

## 2018-08-26 NOTE — Telephone Encounter (Signed)
Rx(s) sent to pharmacy electronically.  

## 2018-08-26 NOTE — Telephone Encounter (Signed)
°*  STAT* If patient is at the pharmacy, call can be transferred to refill team.   1. Which medications need to be refilled? (please list name of each medication and dose if known)  potassium chloride SA (K-DUR,KLOR-CON) 20 MEQ tablet  furosemide (LASIX) 40 MG tablet  digoxin (LANOXIN) 0.125 MG tablet  2. Which pharmacy/location (including street and city if local pharmacy) is medication to be sent to? [facility] [place], [place] - [address]  3. Do they need a 30 day or 90 day supply? 30 days

## 2018-11-08 ENCOUNTER — Telehealth: Payer: Self-pay | Admitting: Cardiovascular Disease

## 2018-11-08 NOTE — Telephone Encounter (Signed)
°*  STAT* If patient is at the pharmacy, call can be transferred to refill team.   1. Which medications need to be refilled? (please list name of each medication and dose if known)  furosemide (LASIX) 40 MG tablet potassium chloride SA (K-DUR,KLOR-CON) 20 MEQ tablet digoxin (LANOXIN) 0.125 MG tablet  2. Which pharmacy/location (including street and city if local pharmacy) is medication to be sent to? [facility] [address] [place], [place] [phone]  3. Do they need a 30 day or 90 day supply? 30 days

## 2018-11-09 MED ORDER — POTASSIUM CHLORIDE CRYS ER 20 MEQ PO TBCR: 20.0000 meq | EXTENDED_RELEASE_TABLET | Freq: Every day | ORAL | 3 refills | Status: DC

## 2018-11-09 MED ORDER — FUROSEMIDE 40 MG PO TABS: 40.0000 mg | ORAL_TABLET | Freq: Every day | ORAL | 3 refills | Status: DC

## 2018-11-09 MED ORDER — DIGOXIN 125 MCG PO TABS: 0.1250 mg | ORAL_TABLET | Freq: Every day | ORAL | 3 refills | Status: DC

## 2019-01-24 IMAGING — CR DG CHEST 2V
2 series · 2 of 2 positions shown · non-contrast
Comparison: 12/09/2017 CT, 01/24/2017 chest radiograph and other
studies

CLINICAL DATA: Preoperative chest radiograph prior ascending aortic
root replacement for aortic aneurysm.

EXAM:
CHEST - 2 VIEW

[w chest pa]
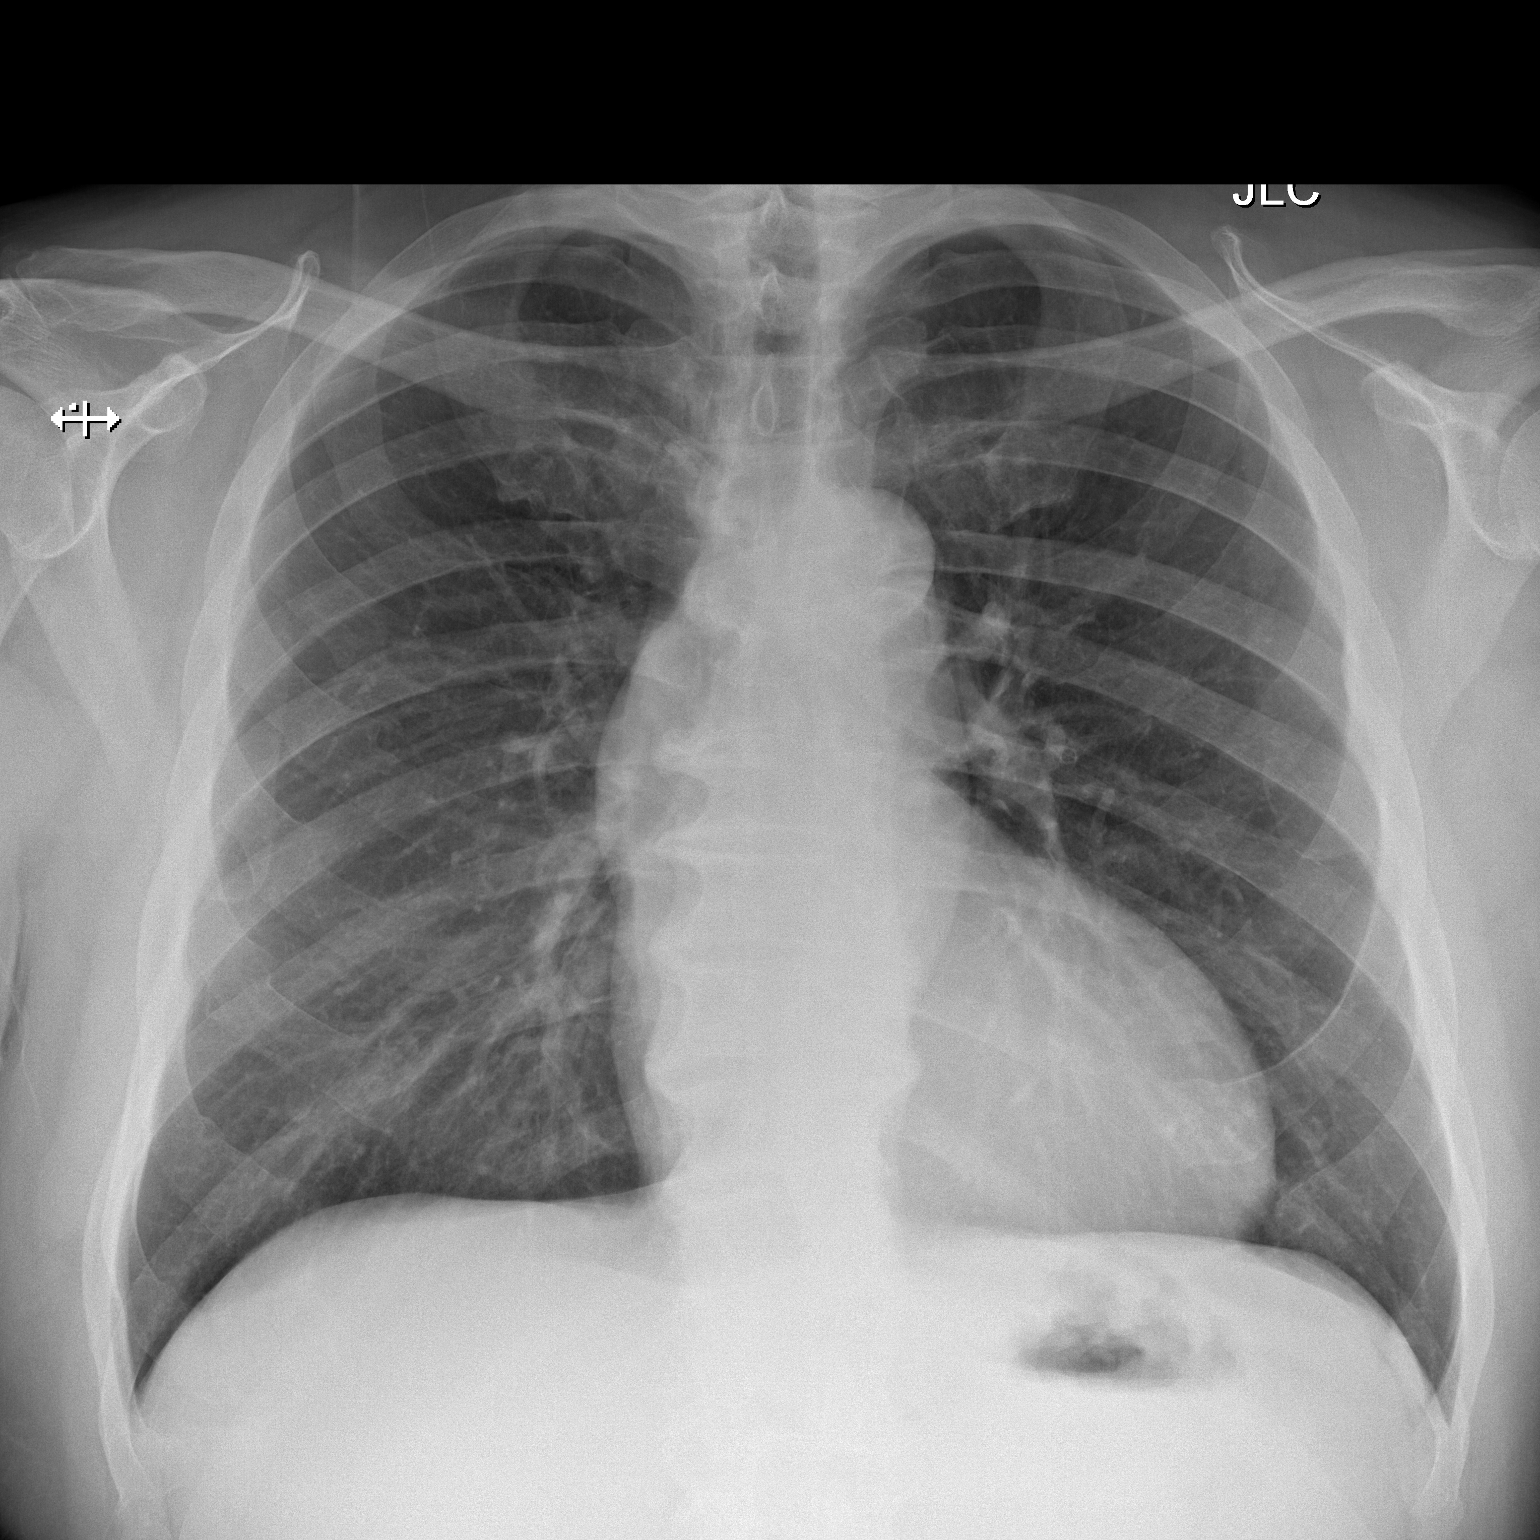

[w chest lat]
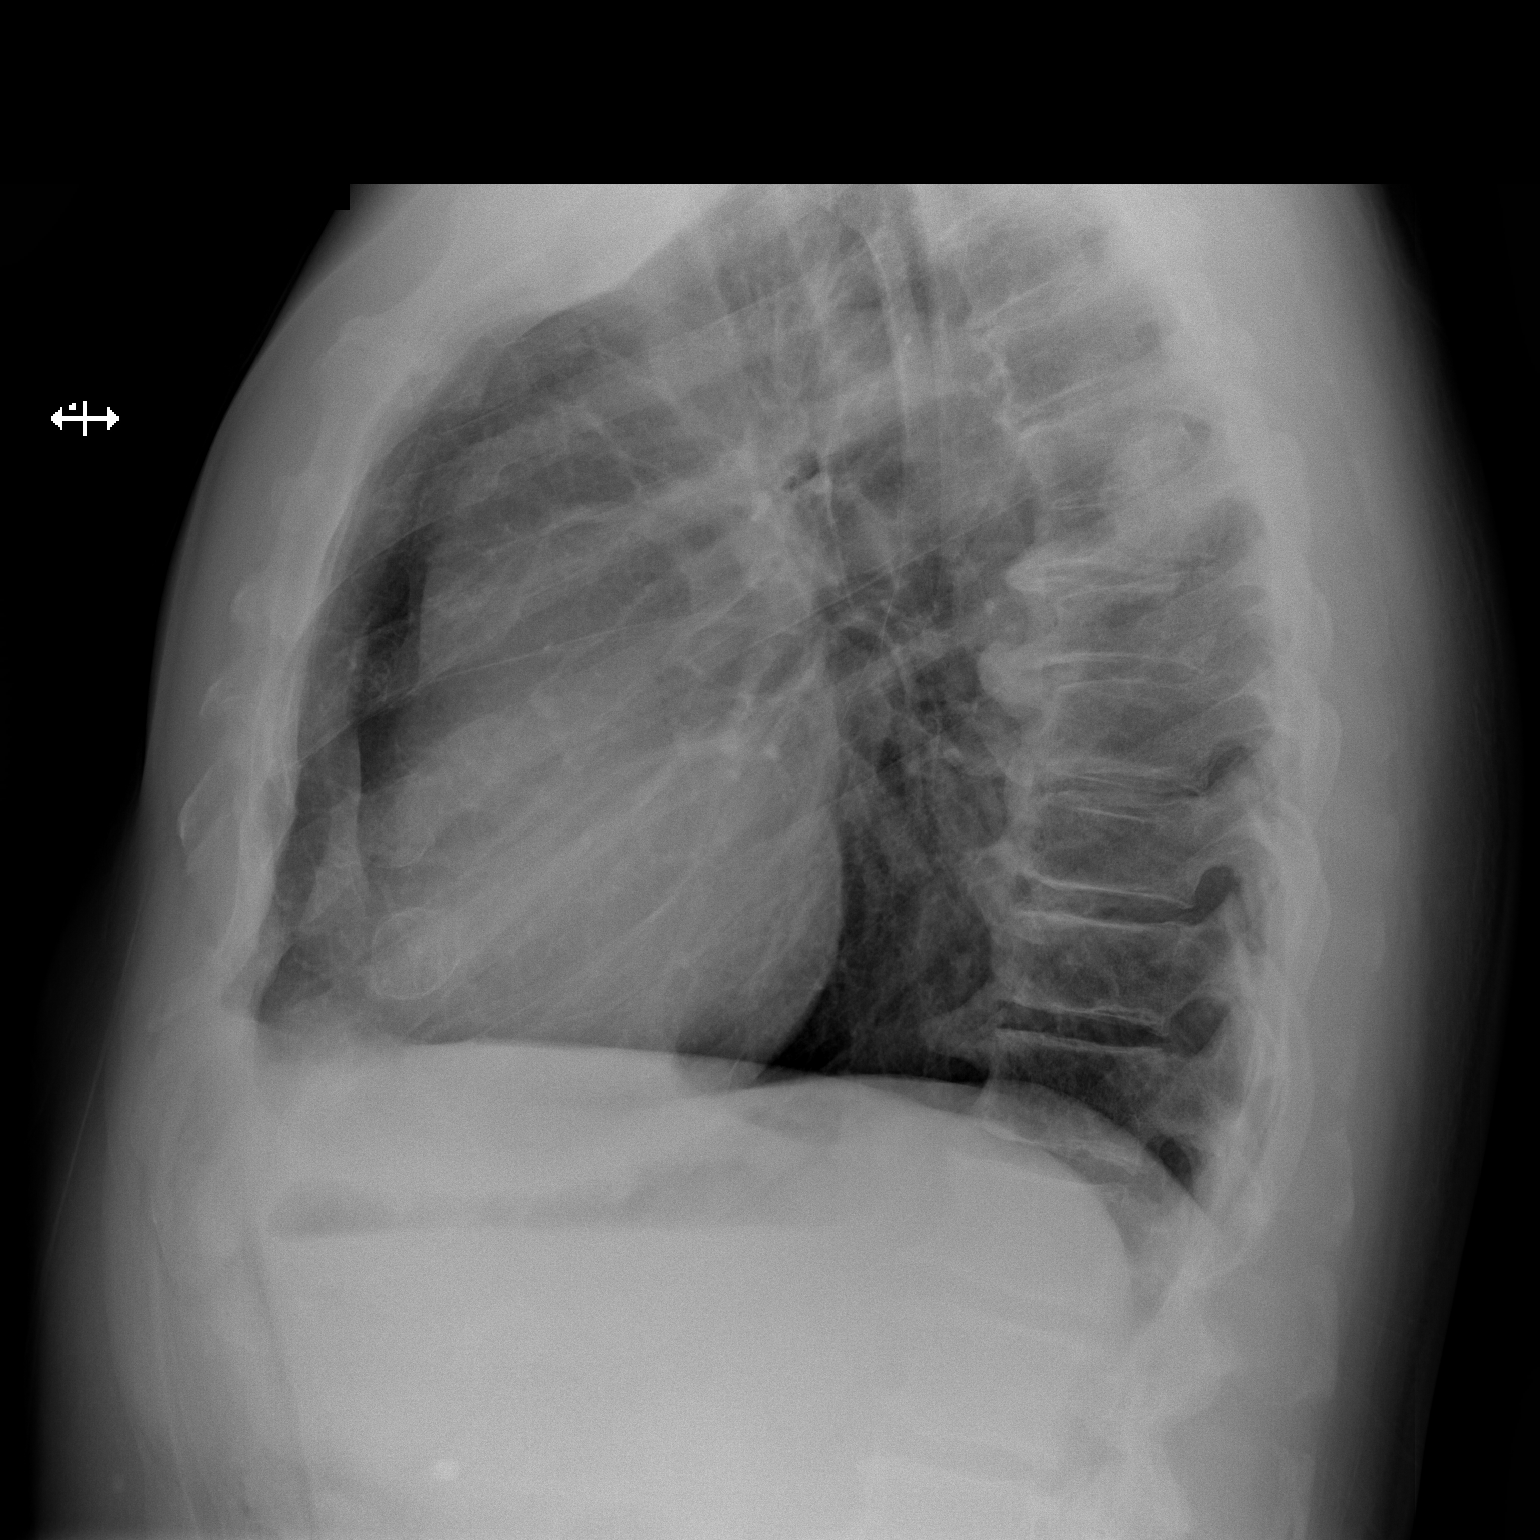

[2 of 2 positions shown; findings below may reference images not displayed]

FINDINGS: Mild cardiomegaly and prominent ascending thoracic aorta again
noted.

There is no evidence of focal airspace disease, pulmonary edema,
suspicious pulmonary nodule/mass, pleural effusion, or pneumothorax.

No acute bony abnormalities are identified. Remote rib fractures
again noted.
IMPRESSION: Mild cardiomegaly and prominent ascending thoracic aorta again
identified.

No acute abnormality.

## 2019-01-27 ENCOUNTER — Other Ambulatory Visit: Payer: Self-pay | Admitting: Surgery

## 2019-01-27 DIAGNOSIS — I712 Thoracic aortic aneurysm, without rupture, unspecified: Secondary | ICD-10-CM

## 2019-01-28 IMAGING — DX DG CHEST 1V PORT
1 series · 1 of 1 positions shown · non-contrast
Comparison: 01/09/2018

CLINICAL DATA: Postop from aortic valve replacement and ascending
aortic aneurysm repair.

EXAM:
PORTABLE CHEST 1 VIEW

[chest ap]
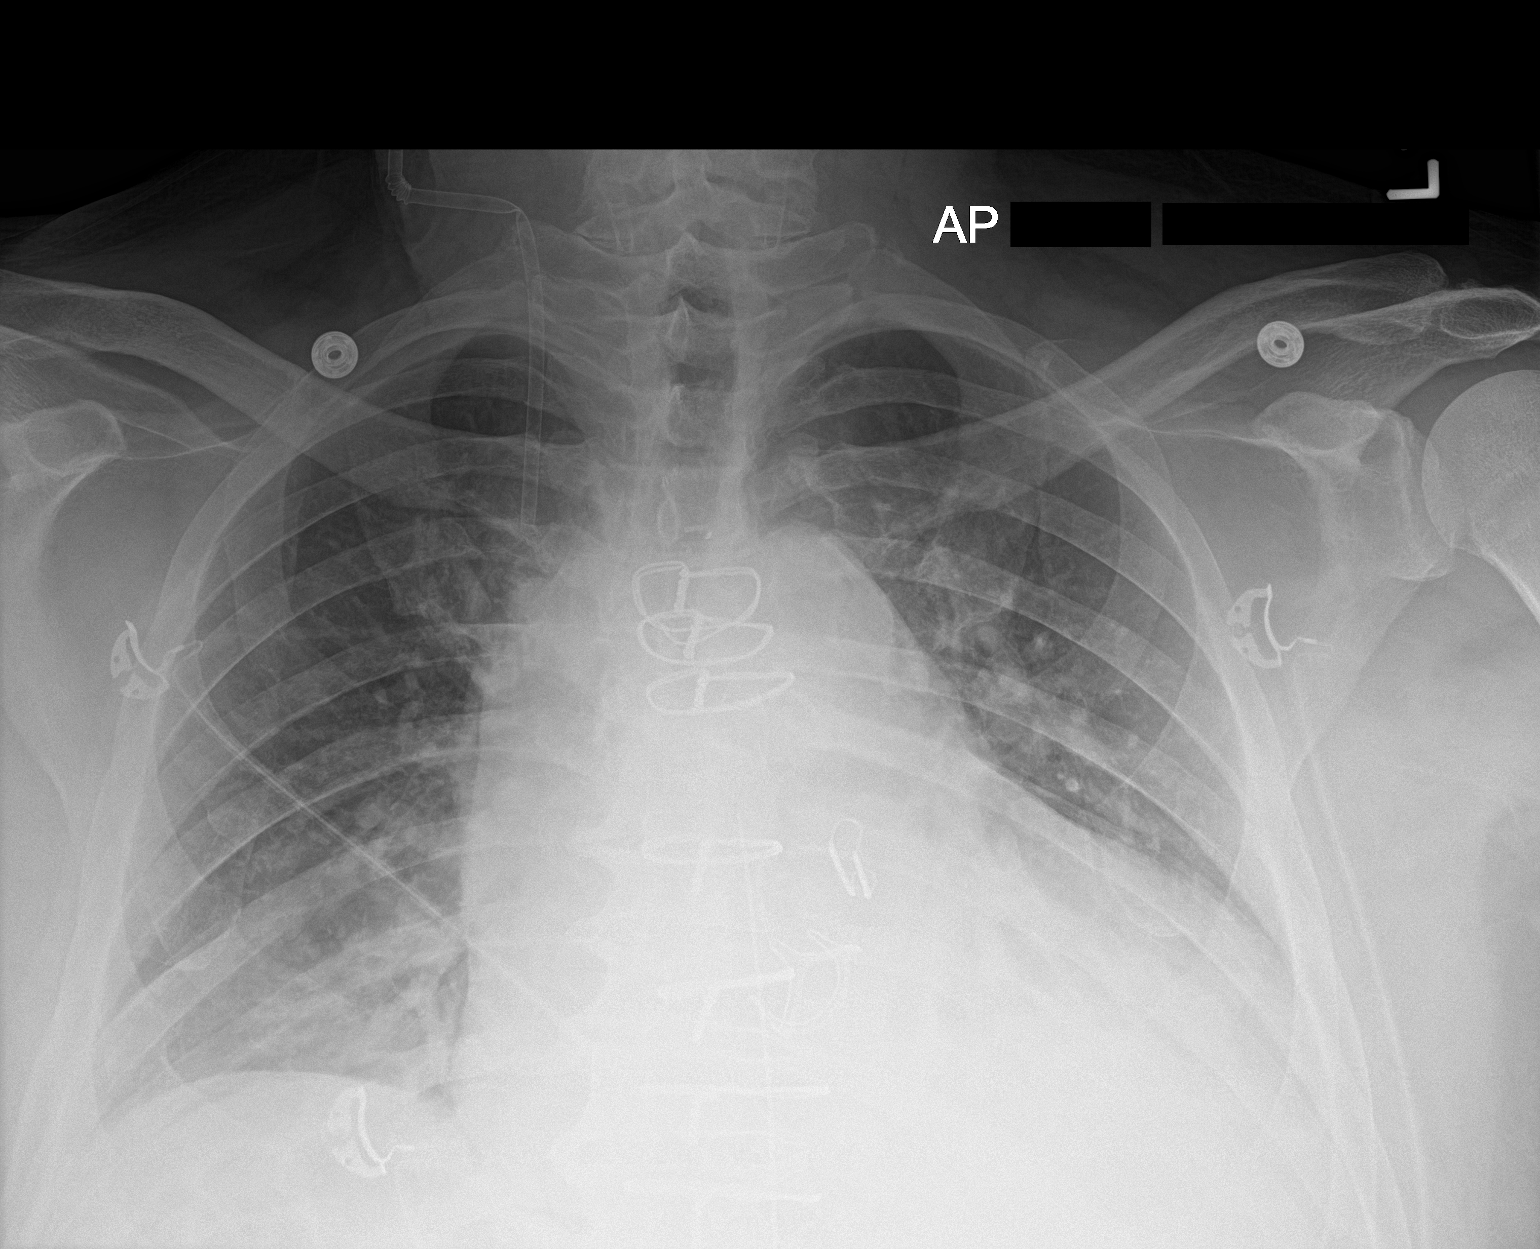

[1 of 1 positions shown; findings below may reference images not displayed]

FINDINGS: Swan-Ganz catheter has been removed since previous study. Right
jugular Cordis and mediastinal drain remain in place. Aortic valve
replacement noted. No pneumothorax visualized.

Cardiomegaly and bibasilar atelectasis show no significant change.
IMPRESSION: Stable cardiomegaly and bibasilar atelectasis. No pneumothorax
visualized.

## 2019-01-30 IMAGING — DX DG CHEST 2V
2 series · 2 of 2 positions shown · non-contrast
Comparison: 01/11/2018.

CLINICAL DATA: Shortness of breath.

EXAM:
CHEST - 2 VIEW

[chest pa]
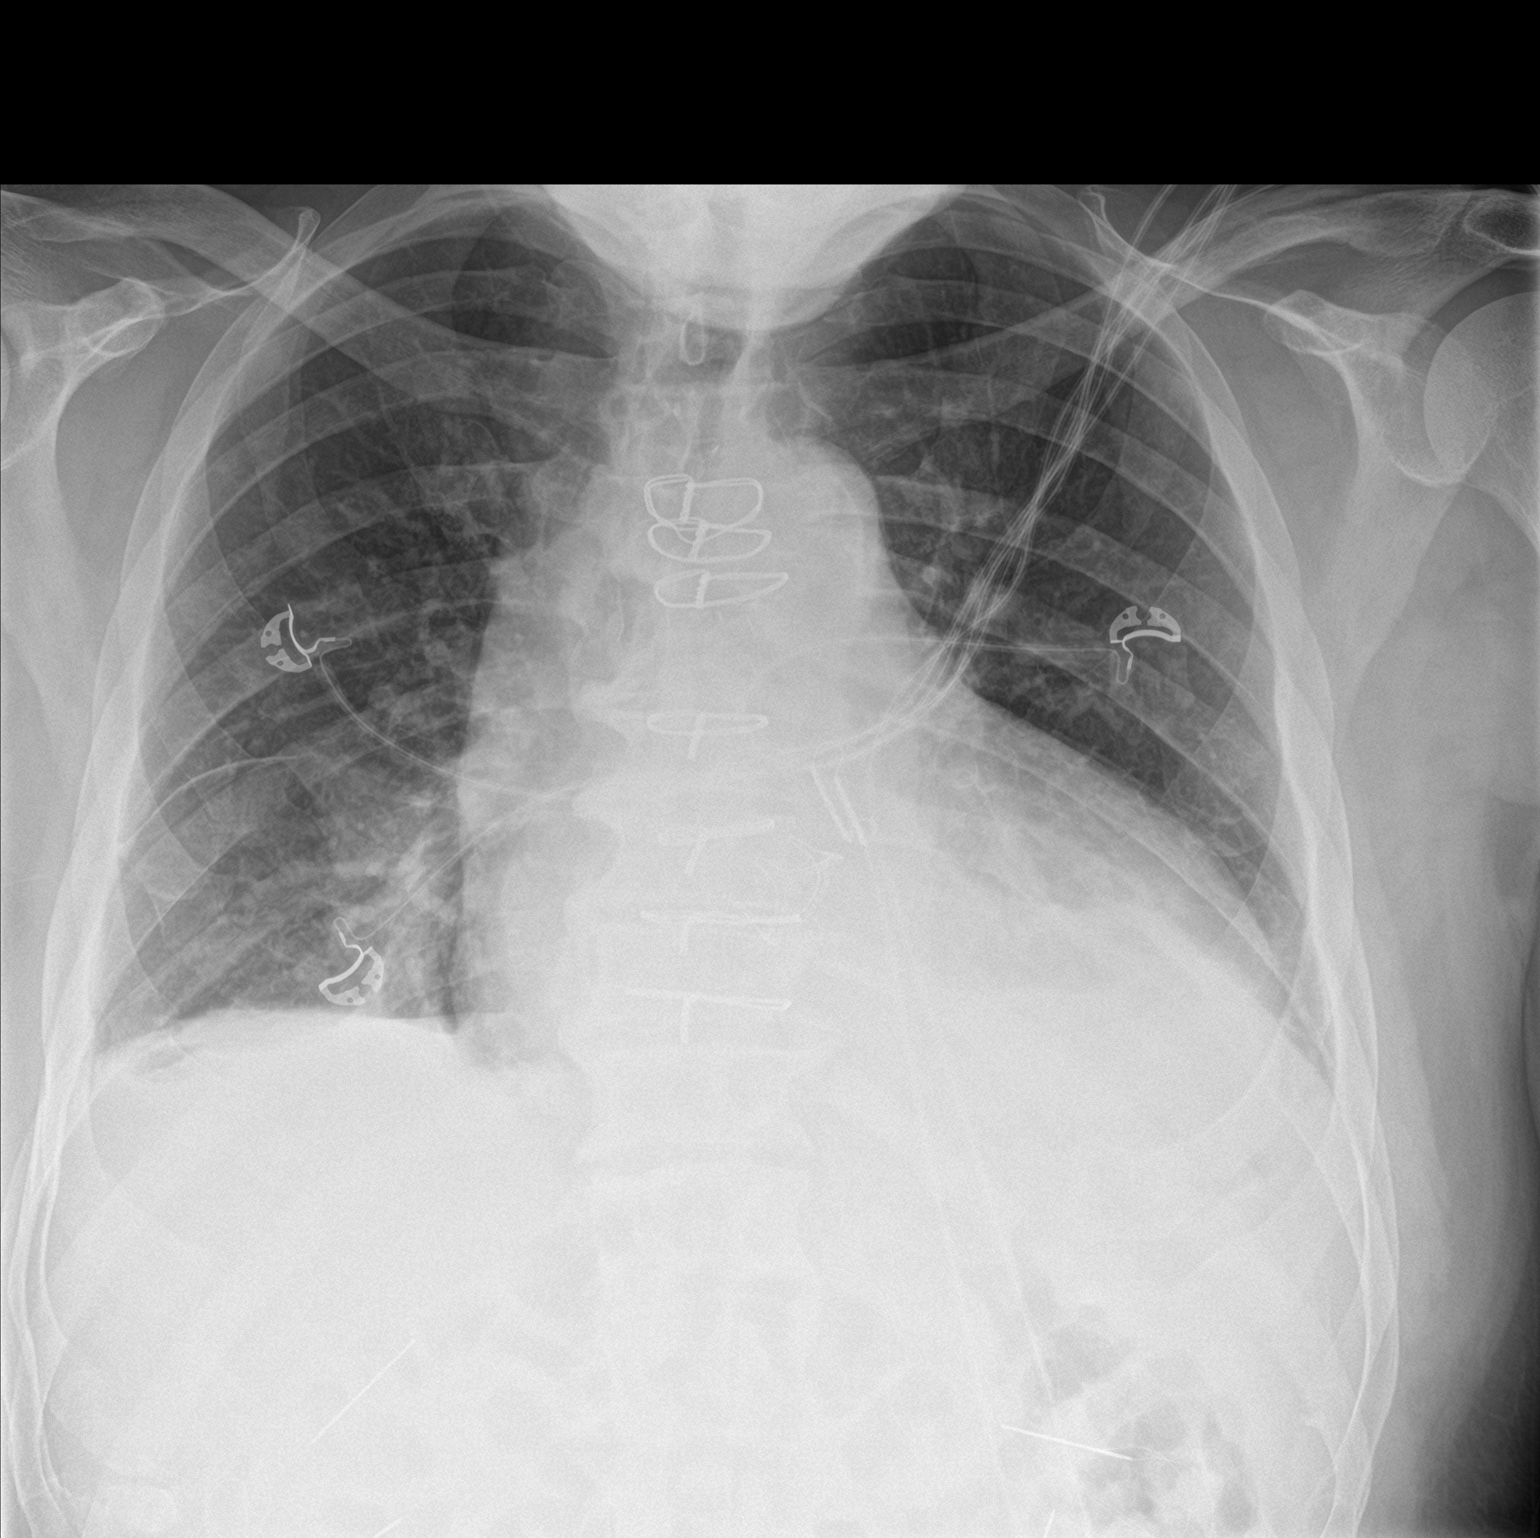

[chest lat]
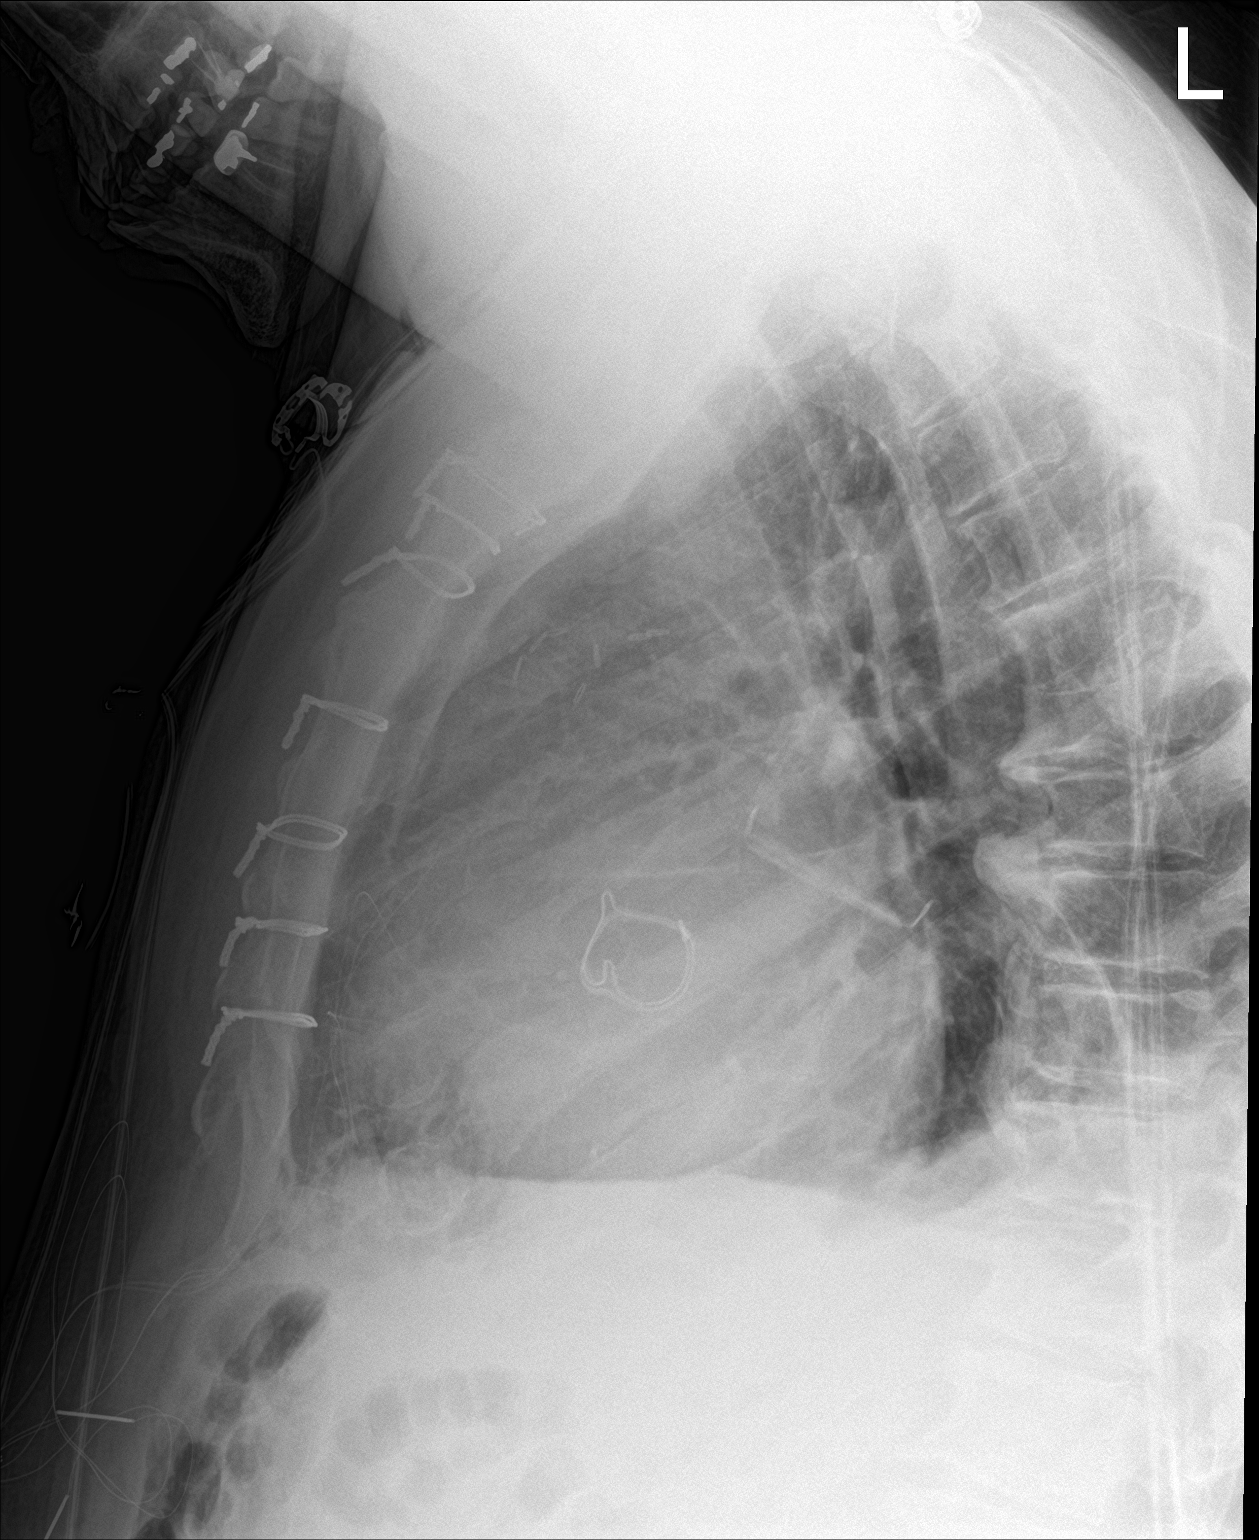

[2 of 2 positions shown; findings below may reference images not displayed]

FINDINGS: Prior cardiac valve replacement. Cardiomegaly. Bibasilar
atelectasis/infiltrates and small bilateral pleural effusions.
Interim improvement from prior exam. No pneumothorax.
IMPRESSION: 1.  Prior cardiac valve replacement.  Cardiomegaly.

2. Bibasilar atelectasis/infiltrates and small bilateral pleural
effusions. Interim improvement from prior exam.

## 2019-02-02 ENCOUNTER — Ambulatory Visit: Payer: No Typology Code available for payment source | Admitting: Surgery

## 2019-03-02 ENCOUNTER — Ambulatory Visit: Payer: No Typology Code available for payment source | Admitting: Surgery

## 2019-03-02 ENCOUNTER — Other Ambulatory Visit: Payer: No Typology Code available for payment source

## 2019-03-11 ENCOUNTER — Other Ambulatory Visit: Payer: Self-pay | Admitting: Cardiovascular Disease

## 2019-04-13 ENCOUNTER — Other Ambulatory Visit: Payer: Self-pay | Admitting: Cardiovascular Disease

## 2019-04-14 ENCOUNTER — Telehealth (INDEPENDENT_AMBULATORY_CARE_PROVIDER_SITE_OTHER): Payer: No Typology Code available for payment source | Admitting: Adult Health

## 2019-04-14 ENCOUNTER — Encounter: Payer: Self-pay | Admitting: Adult Health

## 2019-04-14 VITALS — Ht 75.0 in | Wt 260.0 lb

## 2019-04-14 DIAGNOSIS — Z8679 Personal history of other diseases of the circulatory system: Secondary | ICD-10-CM

## 2019-04-14 DIAGNOSIS — Z79899 Other long term (current) drug therapy: Secondary | ICD-10-CM

## 2019-04-14 DIAGNOSIS — I10 Essential (primary) hypertension: Secondary | ICD-10-CM

## 2019-04-14 DIAGNOSIS — I5042 Chronic combined systolic (congestive) and diastolic (congestive) heart failure: Secondary | ICD-10-CM

## 2019-04-14 MED ORDER — DIGOXIN 125 MCG PO TABS: 0.1250 mg | ORAL_TABLET | Freq: Every day | ORAL | 2 refills | Status: DC

## 2019-04-14 MED ORDER — METOPROLOL SUCCINATE ER 50 MG PO TB24: 50.0000 mg | ORAL_TABLET | Freq: Two times a day (BID) | ORAL | 1 refills | Status: DC

## 2019-04-14 MED ORDER — FUROSEMIDE 40 MG PO TABS: 40.0000 mg | ORAL_TABLET | Freq: Every day | ORAL | 2 refills | Status: DC

## 2019-04-14 NOTE — Progress Notes (Signed)
Caleb [name] tends to only check in with [place] intermittently.  Only seeks medical attention when things are getting very serious.  I am pretty sure we did have him on Entresto and not sure when/how it was stopped.  We have discussed ICD, but wanted to perform a pacemaker after he had been on appropriate heart failure therapy for at least 3 months.  I guess the clock starts ticking again after we get him on all appropriate medications.
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
## 2019-04-14 NOTE — Patient Instructions (Signed)
Medication Instructions:  Continue current medications  *If you need a refill on your cardiac medications before your next appointment, please call your pharmacy*  Lab Work: CBC, BMP and Digoxin Level  If you have labs (blood work) drawn today and your tests are completely normal, you will receive your results only by: Marland Kitchen MyChart Message (if you have MyChart) OR . A paper copy in the mail If you have any lab test that is abnormal or we need to change your treatment, we will call you to review the results.  Testing/Procedures: None Ordered  Follow-Up: At [facility], you and your health needs are our priority.  As part of our continuing mission to provide you with exceptional heart care, we have created designated Provider Care Teams.  These Care Teams include your primary Cardiologist (physician) and Advanced Practice Providers (APPs -  Physician Assistants and Nurse Practitioners) who all work together to provide you with the care you need, when you need it.  Your next appointment:   3 months  The format for your next appointment:   In Person  Provider:   Sanda Klein, MD

## 2019-08-18 ENCOUNTER — Other Ambulatory Visit: Payer: Self-pay | Admitting: Cardiovascular Disease

## 2020-01-06 ENCOUNTER — Other Ambulatory Visit: Payer: Self-pay | Admitting: Physician Assistant

## 2020-01-06 ENCOUNTER — Telehealth: Payer: Self-pay | Admitting: Physician Assistant

## 2020-01-06 NOTE — Telephone Encounter (Signed)
Patient is a 59 year old male was past medical history of persistent atrial fibrillation on Lasix, bicuspid aortic valve was moderate aortic regurgitation and a thoracic aortic aneurysm s/p Bentall procedure on 01/08/2018 with left atrial clipping, hypertension, and chronic systolic CHF with baseline EF 32% by echocardiogram in 2019.  Patient only has intermittent follow-up with cardiology service as he spends half of the year in [place].  He was last seen in the office on 02/01/2018 by Corine Shelter.  Since then, he was able to see Bailey Mech, NP via virtual visit on 04/14/2019.  Patient contacted the after hour answering service from [facility] with complaint of increasing dyspnea on exertion and also at rest, orthopnea and PND for the past several days.  He has run out of of potassium supplement along times ago.  He still takes 40 mg daily of Lasix.  His symptom is concerning for pulmonary edema.  Therefore I recommended the patient to go to the nearest ED for evaluation.  He would benefit from establishing with a local cardiologist in [facility] who can see him during the 6 month he spends there.  He was agreeable with the plan to seek medical attention at this point. He needs more frequent follow up than just yearly visit.

## 2020-03-03 ENCOUNTER — Other Ambulatory Visit: Payer: Self-pay | Admitting: Cardiovascular Disease

## 2021-03-15 ENCOUNTER — Telehealth: Payer: Self-pay | Admitting: Cardiovascular Disease

## 2021-03-15 NOTE — Telephone Encounter (Signed)
*  STAT* If patient is at the pharmacy, call can be transferred to refill team.   1. Which medications need to be refilled? (please list name of each medication and dose if known)  furosemide (LASIX) 40 MG tablet;  potassium chloride SA (KLOR-CON) 20 MEQ tablet 2. Which pharmacy/location (including street and city if local pharmacy) is medication to be sent to?  [id number] - [place], [place] - [address] AT [facility] OF [address]  3. Do they need a 30 day or 90 day supply? 90 day

## 2021-03-21 ENCOUNTER — Ambulatory Visit: Payer: No Typology Code available for payment source | Admitting: Physician Assistant

## 2021-05-22 ENCOUNTER — Ambulatory Visit: Payer: No Typology Code available for payment source | Admitting: Physician Assistant

## 2022-11-28 ENCOUNTER — Other Ambulatory Visit: Payer: Self-pay

## 2022-11-28 ENCOUNTER — Emergency Department (HOSPITAL_COMMUNITY)
Admission: EM | Admit: 2022-11-28 | Discharge: 2022-11-28 | Disposition: A | Discharge status: Home / Self Care | Payer: No Typology Code available for payment source | Attending: Emergency Medicine | Admitting: Emergency Medicine

## 2022-11-28 ENCOUNTER — Encounter (HOSPITAL_COMMUNITY): Payer: Self-pay

## 2022-11-28 ENCOUNTER — Emergency Department (HOSPITAL_COMMUNITY): Payer: No Typology Code available for payment source

## 2022-11-28 DIAGNOSIS — I110 Hypertensive heart disease with heart failure: Secondary | ICD-10-CM | POA: Insufficient documentation

## 2022-11-28 DIAGNOSIS — I509 Heart failure, unspecified: Secondary | ICD-10-CM | POA: Insufficient documentation

## 2022-11-28 DIAGNOSIS — R600 Localized edema: Secondary | ICD-10-CM | POA: Insufficient documentation

## 2022-11-28 DIAGNOSIS — Z760 Encounter for issue of repeat prescription: Secondary | ICD-10-CM

## 2022-11-28 DIAGNOSIS — Z79899 Other long term (current) drug therapy: Secondary | ICD-10-CM | POA: Insufficient documentation

## 2022-11-28 DIAGNOSIS — Z7901 Long term (current) use of anticoagulants: Secondary | ICD-10-CM | POA: Insufficient documentation

## 2022-11-28 DIAGNOSIS — Z8673 Personal history of transient ischemic attack (TIA), and cerebral infarction without residual deficits: Secondary | ICD-10-CM | POA: Insufficient documentation

## 2022-11-28 MED ORDER — FUROSEMIDE 20 MG PO TABS: 40.0000 mg | ORAL_TABLET | Freq: Two times a day (BID) | ORAL | 1 refills | Status: DC

## 2022-11-28 NOTE — ED Triage Notes (Signed)
Pt arrived POV. C/O SHOB. Pt ran out of lasix 3 days ago and is having increasing shob. Pt requesting a lasix refill.

## 2022-11-28 NOTE — ED Provider Notes (Signed)
[place] EMERGENCY DEPARTMENT AT [facility] Provider Note   CSN: [id number] Arrival date & time: 11/28/22  1028     History  Chief Complaint  Patient presents with   Shortness of Breath    Caleb [name] is a 62 y.o. male with past medical history of AAA, heart failure, CVA, hypertension who presents to the ED requesting medication refill.  He states that he splits his time between [place] and [place] and is scheduled to follow-up with his outpatient team at the end of the summer when he returns to [facility]  He has ran out of his Lasix which she takes 40 mg of twice daily.  Last dose was approximately 3 days ago.  States that he is feeling slightly short of breath but is otherwise feeling in his normal state of health.  No increased lower extremity edema.  No chest pain, nausea, vomiting, diarrhea, cough, or other complaints.  States that he supplements with potassium daily and has never had issues with significant electrolyte disturbances.  Denies a history of kidney problems.    Home Medications Prior to Admission medications   Medication Sig Start Date End Date Taking? Authorizing Provider  furosemide (LASIX) 20 MG tablet Take 2 tablets (40 mg total) by mouth 2 (two) times daily. 11/28/22 01/27/23 Yes Tierria Watson L, PA-C  amiodarone (PACERONE) 100 MG tablet Take 1 tablet (100 mg total) by mouth daily. Patient not taking: Reported on 04/14/2019 02/04/18   Croitoru, Rachelle Hora, MD  apixaban (ELIQUIS) 5 MG TABS tablet Take 1 tablet (5 mg total) by mouth 2 (two) times daily. Patient taking differently: Take 5 mg by mouth daily.  03/31/18   Croitoru, Mihai, MD  Cholecalciferol (VITAMIN D PO) Take 5,000 Units by mouth daily.     [provider]  digoxin (LANOXIN) 0.125 MG tablet Take 1 tablet (0.125 mg total) by mouth daily. 04/14/19   Croitoru, Mihai, MD  furosemide (LASIX) 40 MG tablet TAKE ONE TABLET BY MOUTH DAILY 03/05/20   Croitoru, Mihai, MD  metoprolol  succinate (TOPROL-XL) 50 MG 24 hr tablet TAKE ONE TABLET BY MOUTH TWICE A DAY WITH MEALS OR IMMEDIATELY FOLLOWING A MEAL 08/19/19   Croitoru, Mihai, MD  potassium chloride SA (KLOR-CON) 20 MEQ tablet Take 1 tablet by mouth once daily 04/13/19   Croitoru, Mihai, MD      Allergies    Patient has no known allergies.    Review of Systems   Review of Systems  All other systems reviewed and are negative.   Physical Exam Updated Vital Signs BP (!) 146/135 (BP Location: Right Arm)   Pulse 93   Temp 98.1 F (36.7 C) (Oral)   Resp 14   SpO2 98%  Physical Exam Vitals and nursing note reviewed.  Constitutional:      General: He is not in acute distress.    Appearance: Normal appearance. He is not ill-appearing, toxic-appearing or diaphoretic.  HENT:     Head: Normocephalic and atraumatic.     Mouth/Throat:     Mouth: Mucous membranes are moist.  Eyes:     Conjunctiva/sclera: Conjunctivae normal.  Cardiovascular:     Rate and Rhythm: Normal rate and regular rhythm.     Heart sounds: No murmur heard. Pulmonary:     Effort: Pulmonary effort is normal. No tachypnea or accessory muscle usage.     Breath sounds: Normal breath sounds. No decreased breath sounds, wheezing, rhonchi or rales.  Chest:  Chest wall: No mass, deformity, tenderness, crepitus or edema.  Abdominal:     General: Abdomen is flat.     Palpations: Abdomen is soft.     Tenderness: There is no abdominal tenderness.  Musculoskeletal:        General: Normal range of motion.     Cervical back: Neck supple.     Right lower leg: Edema (trace pitting, symmetrical) present.     Left lower leg: Edema (trace pitting, symmetrical) present.  Skin:    General: Skin is warm and dry.     Capillary Refill: Capillary refill takes less than 2 seconds.  Neurological:     Mental Status: He is alert. Mental status is at baseline.  Psychiatric:        Behavior: Behavior normal.     ED Results / Procedures / Treatments    Labs (all labs ordered are listed, but only abnormal results are displayed) Labs Reviewed - No data to display  EKG None  Radiology DG Chest 2 View  Result Date: 11/28/2022 CLINICAL DATA:  Shortness of breath EXAM: CHEST - 2 VIEW COMPARISON:  02/10/2018 FINDINGS: Status post median sternotomy. Atrial occlusion clip. Borderline cardiopericardial silhouette. No consolidation, pneumothorax or effusion. No edema. Prosthetic aortic valve. Degenerative changes seen of the spine on lateral view. Air-fluid level along the stomach beneath the left hemidiaphragm. IMPRESSION: Postop chest. Borderline size heart. No acute cardiopulmonary disease. Electronically Signed   By: Karen Kays M.D.   On: 11/28/2022 11:31    Procedures Procedures    Medications Ordered in ED Medications - No data to display  ED Course/ Medical Decision Making/ A&P                             Medical Decision Making Amount and/or Complexity of Data Reviewed Radiology: ordered. Decision-making details documented in ED Course.  Risk Prescription drug management.   Medical Decision Making:   Caleb [name] is a 62 y.o. male who presented to the ED today with request for medication refill detailed above.    Patient's presentation is complicated by their history of heart failure, hypertension.  Complete initial physical exam performed, notably the patient was in no acute distress.  Lungs clear to auscultation.  Minimal trace pitting edema bilaterally.  No lower extremity skin changes, no tenderness.  No signs of respiratory distress.  Patient neurologically intact.    Reviewed and confirmed nursing documentation for past medical history, family history, social history.    Initial Assessment:   With the patient's presentation, differential diagnosis includes but is not limited to acute heart failure exacerbation, volume overload, electrolyte disturbance, dehydration, medication refill, medication noncompliance, renal  disease, pneumonia, URI. This is most consistent with an acute complicated illness  Initial Plan:  Screening labs including CBC and Metabolic panel to evaluate for infectious or metabolic etiology of disease.  Declined by patient CXR to evaluate for structural/infectious intrathoracic pathology.  EKG to evaluate for cardiac pathology Objective evaluation as below reviewed   Initial Study Results:   Laboratory  Declined by patient  EKG EKG was reviewed independently.  A-fib at a rate of 102 bpm.  Patient with known history of A-fib.  No STEMI. Radiology:  All images reviewed independently. Agree with radiology report at this time.   DG Chest 2 View  Result Date: 11/28/2022 CLINICAL DATA:  Shortness of breath EXAM: CHEST - 2 VIEW COMPARISON:  02/10/2018 FINDINGS: Status post median sternotomy.  Atrial occlusion clip. Borderline cardiopericardial silhouette. No consolidation, pneumothorax or effusion. No edema. Prosthetic aortic valve. Degenerative changes seen of the spine on lateral view. Air-fluid level along the stomach beneath the left hemidiaphragm. IMPRESSION: Postop chest. Borderline size heart. No acute cardiopulmonary disease. Electronically Signed   By: Karen Kays M.D.   On: 11/28/2022 11:31      Final Assessment and Plan:   62 year old male presents to the ED requesting medication refill of Lasix.  Notes slight dyspnea for the last 3 days since running out of his medication.  Splits his time between states and is scheduled to follow-up outpatient with his provider in [place] later this summer.  Chest x-ray without significant findings.  Patient does not appear significantly fluid overloaded.  He is in no respiratory distress.  He is hypertensive but otherwise vital signs are stable.  States that he has been in his baseline state of health.  Discussed recommendation for further workup of patient's symptoms and he declined.  Declined labs, any additional assessment. Agreeable to  closely follow up with outpatient provider who he is scheduled to see when he returns to [facility] later this summer.  Will refill Lasix given patient's history of heart failure.  Patient grateful for medication refill and reiterates that he does not desire further workup today.  Strict ED return precautions given, all questions answered, and stable for discharge.   Clinical Impression:  1. Medication refill      Discharge           Final Clinical Impression(s) / ED Diagnoses Final diagnoses:  Medication refill    Rx / DC Orders ED Discharge Orders          Ordered    furosemide (LASIX) 20 MG tablet  2 times daily        11/28/22 1408              Tonette Lederer, PA-C 11/28/22 1512    Margarita Grizzle, MD 11/28/22 1755

## 2022-11-28 NOTE — Discharge Instructions (Addendum)
Thank you for letting us take care of you today.  Your chest x-ray looked good. I refilled your Lasix. Please follow up closely with your outpatient team including your primary care provider or cardiologist as we discussed. For new or worsening symptoms, return to nearest ED for re-evaluation.

## 2023-03-11 ENCOUNTER — Other Ambulatory Visit: Payer: Self-pay

## 2023-03-11 ENCOUNTER — Emergency Department (HOSPITAL_COMMUNITY): Payer: MEDICAID

## 2023-03-11 ENCOUNTER — Encounter (HOSPITAL_COMMUNITY): Payer: Self-pay

## 2023-03-11 ENCOUNTER — Inpatient Hospital Stay (HOSPITAL_COMMUNITY)
Admission: EM | Admit: 2023-03-11 | Discharge: 2023-03-13 | DRG: 309 | Disposition: A | Discharge status: Home / Self Care | Payer: MEDICAID | Attending: Internal Medicine | Admitting: Internal Medicine

## 2023-03-11 ENCOUNTER — Inpatient Hospital Stay (HOSPITAL_COMMUNITY): Payer: MEDICAID

## 2023-03-11 DIAGNOSIS — Z953 Presence of xenogenic heart valve: Secondary | ICD-10-CM | POA: Diagnosis not present

## 2023-03-11 DIAGNOSIS — I428 Other cardiomyopathies: Secondary | ICD-10-CM

## 2023-03-11 DIAGNOSIS — I482 Chronic atrial fibrillation, unspecified: Secondary | ICD-10-CM

## 2023-03-11 DIAGNOSIS — R29702 NIHSS score 2: Secondary | ICD-10-CM | POA: Diagnosis present

## 2023-03-11 DIAGNOSIS — Z5986 Financial insecurity: Secondary | ICD-10-CM

## 2023-03-11 DIAGNOSIS — E785 Hyperlipidemia, unspecified: Secondary | ICD-10-CM | POA: Diagnosis present

## 2023-03-11 DIAGNOSIS — Z79899 Other long term (current) drug therapy: Secondary | ICD-10-CM

## 2023-03-11 DIAGNOSIS — F1510 Other stimulant abuse, uncomplicated: Secondary | ICD-10-CM | POA: Diagnosis present

## 2023-03-11 DIAGNOSIS — I5022 Chronic systolic (congestive) heart failure: Secondary | ICD-10-CM | POA: Diagnosis present

## 2023-03-11 DIAGNOSIS — T45516A Underdosing of anticoagulants, initial encounter: Secondary | ICD-10-CM | POA: Diagnosis present

## 2023-03-11 DIAGNOSIS — R29810 Facial weakness: Secondary | ICD-10-CM | POA: Diagnosis present

## 2023-03-11 DIAGNOSIS — Z91120 Patient's intentional underdosing of medication regimen due to financial hardship: Secondary | ICD-10-CM | POA: Diagnosis not present

## 2023-03-11 DIAGNOSIS — Z91148 Patient's other noncompliance with medication regimen for other reason: Secondary | ICD-10-CM

## 2023-03-11 DIAGNOSIS — F1210 Cannabis abuse, uncomplicated: Secondary | ICD-10-CM | POA: Diagnosis present

## 2023-03-11 DIAGNOSIS — R471 Dysarthria and anarthria: Secondary | ICD-10-CM | POA: Diagnosis present

## 2023-03-11 DIAGNOSIS — R4701 Aphasia: Secondary | ICD-10-CM | POA: Diagnosis present

## 2023-03-11 DIAGNOSIS — I639 Cerebral infarction, unspecified: Principal | ICD-10-CM | POA: Insufficient documentation

## 2023-03-11 DIAGNOSIS — I4821 Permanent atrial fibrillation: Principal | ICD-10-CM

## 2023-03-11 DIAGNOSIS — I5043 Acute on chronic combined systolic (congestive) and diastolic (congestive) heart failure: Secondary | ICD-10-CM

## 2023-03-11 DIAGNOSIS — I110 Hypertensive heart disease with heart failure: Secondary | ICD-10-CM | POA: Diagnosis present

## 2023-03-11 DIAGNOSIS — E876 Hypokalemia: Secondary | ICD-10-CM | POA: Diagnosis present

## 2023-03-11 DIAGNOSIS — R4781 Slurred speech: Secondary | ICD-10-CM | POA: Insufficient documentation

## 2023-03-11 DIAGNOSIS — R1310 Dysphagia, unspecified: Secondary | ICD-10-CM | POA: Diagnosis present

## 2023-03-11 DIAGNOSIS — F419 Anxiety disorder, unspecified: Secondary | ICD-10-CM | POA: Diagnosis present

## 2023-03-11 DIAGNOSIS — N179 Acute kidney failure, unspecified: Secondary | ICD-10-CM | POA: Diagnosis present

## 2023-03-11 DIAGNOSIS — Z8673 Personal history of transient ischemic attack (TIA), and cerebral infarction without residual deficits: Secondary | ICD-10-CM | POA: Diagnosis not present

## 2023-03-11 DIAGNOSIS — Z8249 Family history of ischemic heart disease and other diseases of the circulatory system: Secondary | ICD-10-CM

## 2023-03-11 LAB — DIFFERENTIAL
Abs Immature Granulocytes: 0.02 10*3/uL (ref 0.00–0.07)
Basophils Absolute: 0.1 10*3/uL (ref 0.0–0.1)
Basophils Relative: 1 %
Eosinophils Absolute: 0.2 10*3/uL (ref 0.0–0.5)
Eosinophils Relative: 2 %
Immature Granulocytes: 0 %
Lymphocytes Relative: 18 %
Lymphs Abs: 1.3 10*3/uL (ref 0.7–4.0)
Monocytes Absolute: 0.6 10*3/uL (ref 0.1–1.0)
Monocytes Relative: 8 %
Neutro Abs: 5.1 10*3/uL (ref 1.7–7.7)
Neutrophils Relative %: 71 %

## 2023-03-11 LAB — CBC
HCT: 44.8 % (ref 39.0–52.0)
Hemoglobin: 14.4 g/dL (ref 13.0–17.0)
MCH: 30.1 pg (ref 26.0–34.0)
MCHC: 32.1 g/dL (ref 30.0–36.0)
MCV: 93.7 fL (ref 80.0–100.0)
Platelets: 205 10*3/uL (ref 150–400)
RBC: 4.78 MIL/uL (ref 4.22–5.81)
RDW: 14.3 % (ref 11.5–15.5)
WBC: 7.3 10*3/uL (ref 4.0–10.5)
nRBC: 0 % (ref 0.0–0.2)

## 2023-03-11 LAB — I-STAT CHEM 8, ED
BUN: 17 mg/dL (ref 8–23)
Calcium, Ion: 1.16 mmol/L (ref 1.15–1.40)
Chloride: 104 mmol/L (ref 98–111)
Creatinine, Ser: 1.4 mg/dL — ABNORMAL HIGH (ref 0.61–1.24)
Glucose, Bld: 89 mg/dL (ref 70–99)
HCT: 45 % (ref 39.0–52.0)
Hemoglobin: 15.3 g/dL (ref 13.0–17.0)
Potassium: 3.4 mmol/L — ABNORMAL LOW (ref 3.5–5.1)
Sodium: 141 mmol/L (ref 135–145)
TCO2: 23 mmol/L (ref 22–32)

## 2023-03-11 LAB — COMPREHENSIVE METABOLIC PANEL
ALT: 25 U/L (ref 0–44)
AST: 31 U/L (ref 15–41)
Albumin: 3.7 g/dL (ref 3.5–5.0)
Alkaline Phosphatase: 44 U/L (ref 38–126)
Anion gap: 6 (ref 5–15)
BUN: 18 mg/dL (ref 8–23)
CO2: 27 mmol/L (ref 22–32)
Calcium: 8.7 mg/dL — ABNORMAL LOW (ref 8.9–10.3)
Chloride: 105 mmol/L (ref 98–111)
Creatinine, Ser: 1.32 mg/dL — ABNORMAL HIGH (ref 0.61–1.24)
GFR, Estimated: >60 mL/min (ref >60)
Glucose, Bld: 89 mg/dL (ref 70–99)
Potassium: 3.3 mmol/L — ABNORMAL LOW (ref 3.5–5.1)
Sodium: 138 mmol/L (ref 135–145)
Total Bilirubin: 1.8 mg/dL — ABNORMAL HIGH (ref 0.3–1.2)
Total Protein: 7 g/dL (ref 6.5–8.1)

## 2023-03-11 LAB — HEMOGLOBIN A1C
Hgb A1c MFr Bld: 5.6 % (ref 4.8–5.6)
Mean Plasma Glucose: 114.02 mg/dL

## 2023-03-11 LAB — HIV ANTIBODY (ROUTINE TESTING W REFLEX): HIV Screen 4th Generation wRfx: NONREACTIVE

## 2023-03-11 LAB — PROTIME-INR
INR: 1.2 (ref 0.8–1.2)
Prothrombin Time: 15.4 s — ABNORMAL HIGH (ref 11.4–15.2)

## 2023-03-11 LAB — ETHANOL: Alcohol, Ethyl (B): <10 mg/dL (ref <10)

## 2023-03-11 LAB — APTT: aPTT: 27 s (ref 24–36)

## 2023-03-11 LAB — CBG MONITORING, ED: Glucose-Capillary: 123 mg/dL — ABNORMAL HIGH (ref 70–99)

## 2023-03-11 MED ORDER — ASPIRIN 81 MG PO CHEW
81.0000 mg | CHEWABLE_TABLET | Freq: Every day | ORAL | Status: DC
  Administered 2023-03-11 – 2023-03-13 (×3): 81 mg via ORAL
  Filled 2023-03-11 (×3): qty 1

## 2023-03-11 MED ORDER — APIXABAN 5 MG PO TABS: 5.0000 mg | ORAL_TABLET | Freq: Two times a day (BID) | ORAL | Status: DC

## 2023-03-11 MED ORDER — TRAZODONE HCL 50 MG PO TABS
25.0000 mg | ORAL_TABLET | Freq: Every evening | ORAL | Status: DC | PRN
  Administered 2023-03-12 (×2): 25 mg via ORAL
  Filled 2023-03-11 (×2): qty 1

## 2023-03-11 MED ORDER — LORAZEPAM 2 MG/ML IJ SOLN
0.5000 mg | Freq: Four times a day (QID) | INTRAMUSCULAR | Status: DC | PRN
  Administered 2023-03-11: 0.5 mg via INTRAVENOUS
  Filled 2023-03-11: qty 1

## 2023-03-11 MED ORDER — METOPROLOL SUCCINATE ER 50 MG PO TB24
50.0000 mg | ORAL_TABLET | Freq: Every day | ORAL | Status: DC
  Administered 2023-03-11 – 2023-03-13 (×3): 50 mg via ORAL
  Filled 2023-03-11 (×3): qty 1

## 2023-03-11 MED ORDER — SODIUM CHLORIDE (PF) 0.9 % IJ SOLN
INTRAMUSCULAR | Status: AC
  Filled 2023-03-11: qty 100

## 2023-03-11 MED ORDER — ACETAMINOPHEN 650 MG RE SUPP: 650.0000 mg | Freq: Four times a day (QID) | RECTAL | Status: DC | PRN

## 2023-03-11 MED ORDER — ACETAMINOPHEN 325 MG PO TABS: 650.0000 mg | ORAL_TABLET | Freq: Four times a day (QID) | ORAL | Status: DC | PRN

## 2023-03-11 MED ORDER — ENOXAPARIN SODIUM 40 MG/0.4ML IJ SOSY
40.0000 mg | PREFILLED_SYRINGE | INTRAMUSCULAR | Status: DC
  Administered 2023-03-11: 40 mg via SUBCUTANEOUS
  Filled 2023-03-11: qty 0.4

## 2023-03-11 MED ORDER — ALBUTEROL SULFATE (2.5 MG/3ML) 0.083% IN NEBU: 2.5000 mg | INHALATION_SOLUTION | RESPIRATORY_TRACT | Status: DC | PRN

## 2023-03-11 MED ORDER — ONDANSETRON HCL 4 MG PO TABS: 4.0000 mg | ORAL_TABLET | Freq: Four times a day (QID) | ORAL | Status: DC | PRN

## 2023-03-11 MED ORDER — IOHEXOL 350 MG/ML SOLN
100.0000 mL | Freq: Once | INTRAVENOUS | Status: AC | PRN
  Administered 2023-03-11: 100 mL via INTRAVENOUS

## 2023-03-11 MED ORDER — SODIUM CHLORIDE 0.9% FLUSH: 3.0000 mL | Freq: Once | INTRAVENOUS | Status: DC

## 2023-03-11 MED ORDER — STROKE: EARLY STAGES OF RECOVERY BOOK
Freq: Once | Status: DC
  Filled 2023-03-11: qty 1

## 2023-03-11 MED ORDER — ONDANSETRON HCL 4 MG/2ML IJ SOLN: 4.0000 mg | Freq: Four times a day (QID) | INTRAMUSCULAR | Status: DC | PRN

## 2023-03-11 NOTE — Progress Notes (Signed)
Neurologist, Dr Ritta Slot, joins the telemedicine cart at 1343.

## 2023-03-11 NOTE — ED Triage Notes (Signed)
Patient expressive aphasic at triage. Is able to shake head to yes and no questions. States started at 0800. Patient has hx of CVA. Was able to follow commands. No weakness or numbness and tingling.

## 2023-03-11 NOTE — Consult Note (Signed)
[facility] Telemedicine Consult   Requesting Provider: plunkett, W Consult Participants: Patient, bedside nurses Location of the provider: [facility] Location of the patient: [facility]  This consult was provided via telemedicine with 2-way video and audio communication. The patient/family was informed that care would be provided in this way and agreed to receive care in this manner.    Chief Complaint: Speech difficulty  HPI: 62 year old male with a history of hypertension, CHF, previous stroke who presents with right-sided weakness and difficulty speaking that started this morning around 8 AM.  He apparently woke up normal, but then started having trouble this morning, but did not come in to get it checked out immediately.  He has a history of atrial fibrillation and is supposed to be taking Eliquis, but was not able to afford it.   LKW: 8 AM tpa given?: No, out of window IR Thrombectomy? No, no LVO Modified Rankin Scale: 0-Completely asymptomatic and back to baseline post- stroke Time of teleneurologist evaluation: 13:43  Exam: Vitals:   03/11/23 1339  BP: (!) 127/94  Pulse: (!) 131  Resp: 16  SpO2: 98%    General: in bed, NAD  1A: Level of Consciousness - 0 1B: Ask Month and Age - 0 1C: 'Blink Eyes' & 'Squeeze Hands' - 0 2: Test Horizontal Extraocular Movements - 0 3: Test Visual Fields - 0 4: Test Facial Palsy - 1 5A: Test Left Arm Motor Drift - 0 5B: Test Right Arm Motor Drift - 0 6A: Test Left Leg Motor Drift - 0 6B: Test Right Leg Motor Drift - 0 7: Test Limb Ataxia - 0 8: Test Sensation - 0 9: Test Language/Aphasia- 1 10: Test Dysarthria - 0 11: Test Extinction/Inattention - 0 NIHSS score: 2   Imaging Reviewed: ct head - negative  Labs reviewed in epic and pertinent values follow: Cr 1.4   Assessment: 62 year old male with new onset right facial weakness and aphasia.  I strongly suspect that he has had an acute ischemic stroke, but with no large  vessel occlusion and being outside the window for IV tenecteplase, the focus will be on secondary prevention and recovery as opposed to acute intervention.  He will need to be admitted for therapy evaluations as well as secondary risk factor modification.  Recommendations:  - HgbA1c, fasting lipid panel - MRI of the brain without contrast - Frequent neuro checks - Echocardiogram - Prophylactic therapy-Antiplatelet med: Aspirin - dose 81mg  daily for now, would await MRI prior to restarting Eliquis for restratification. - Risk factor modification - Telemetry monitoring - PT consult, OT consult, Speech consult - Stroke team to follow    Ritta Slot, MD [facility] [phone]  If 7pm- 7am, please page neurology on call as listed in AMION.

## 2023-03-11 NOTE — Progress Notes (Addendum)
Stroke Alert cart activation at 1339. MRS 1.
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
## 2023-03-11 NOTE — Progress Notes (Signed)
Patient to CT department, cart taken with patient

## 2023-03-11 NOTE — ED Provider Notes (Addendum)
[place] [facility]PARTMENT AT [facility] Provider Note   CSN: [id number] Arrival date & time: 03/11/23  1329  An emergency department physician performed an initial assessment on this suspected stroke patient at 1351.  History  Chief Complaint  Patient presents with   Dysphagia    Caleb [name] is a 62 y.o. male.  Patient is a 63 year old male with a history of thoracic aortic aneurysm, AAA, prior stroke, CHF last EF 25 to 30% in 2019, A-fib not currently on anticoagulation who has been off all of his medications he reports for too long due to insurance issues who is presenting today with neurologic complaints.  Patient reports he woke up feeling normal today and shortly after waking up around 8:00 he developed numbness in his right arm and difficulty speaking.  He states currently the numbness has resolved in his arm but he is still having a lot of trouble getting his words out.  He has not had any difficulty walking has had no falls does not have a headache or neck pain.  He was able to drive himself to the emergency room and reports that he lives alone.  He denies any chest pain or shortness of breath  The history is provided by the patient and medical records.       Home Medications Prior to Admission medications   Medication Sig Start Date End Date Taking? Authorizing Provider  amiodarone (PACERONE) 100 MG tablet Take 1 tablet (100 mg total) by mouth daily. Patient not taking: Reported on 04/14/2019 02/04/18   Croitoru, Rachelle Hora, MD  apixaban (ELIQUIS) 5 MG TABS tablet Take 1 tablet (5 mg total) by mouth 2 (two) times daily. Patient taking differently: Take 5 mg by mouth daily.  03/31/18   Croitoru, Mihai, MD  Cholecalciferol (VITAMIN D PO) Take 5,000 Units by mouth daily.     [provider]  digoxin (LANOXIN) 0.125 MG tablet Take 1 tablet (0.125 mg total) by mouth daily. 04/14/19   Croitoru, Mihai, MD  furosemide (LASIX) 20 MG tablet Take 2 tablets (40 mg  total) by mouth 2 (two) times daily. 11/28/22 01/27/23  Gowens, Mariah L, PA-C  furosemide (LASIX) 40 MG tablet TAKE ONE TABLET BY MOUTH DAILY 03/05/20   Croitoru, Mihai, MD  metoprolol succinate (TOPROL-XL) 50 MG 24 hr tablet TAKE ONE TABLET BY MOUTH TWICE A DAY WITH MEALS OR IMMEDIATELY FOLLOWING A MEAL 08/19/19   Croitoru, Mihai, MD  potassium chloride SA (KLOR-CON) 20 MEQ tablet Take 1 tablet by mouth once daily 04/13/19   Croitoru, Mihai, MD      Allergies    Patient has no known allergies.    Review of Systems   Review of Systems  Physical Exam Updated Vital Signs BP (!) 127/93 (BP Location: Right Arm)   Pulse (!) 105   Resp 16   Ht 6\' 3"  (1.905 m)   SpO2 99%   BMI 32.50 kg/m  Physical Exam Vitals and nursing note reviewed.  Constitutional:      General: He is not in acute distress.    Appearance: He is well-developed.  HENT:     Head: Normocephalic and atraumatic.  Eyes:     General: No visual field deficit.    Conjunctiva/sclera: Conjunctivae normal.     Pupils: Pupils are equal, round, and reactive to light.  Cardiovascular:     Rate and Rhythm: Tachycardia present. Rhythm irregularly irregular.     Heart sounds: No murmur heard. Pulmonary:  Effort: Pulmonary effort is normal. No respiratory distress.     Breath sounds: Normal breath sounds. No wheezing or rales.  Abdominal:     General: There is no distension.     Palpations: Abdomen is soft.     Tenderness: There is no abdominal tenderness. There is no guarding or rebound.  Musculoskeletal:        General: No tenderness. Normal range of motion.     Cervical back: Normal range of motion and neck supple.     Right lower leg: No edema.     Left lower leg: No edema.  Skin:    General: Skin is warm and dry.     Findings: No erythema or rash.  Neurological:     Mental Status: He is alert and oriented to person, place, and time.     Cranial Nerves: Dysarthria present. No facial asymmetry.     Sensory: Sensation  is intact.     Motor: Motor function is intact. No weakness or pronator drift.     Coordination: Coordination is intact. Coordination normal. Finger-Nose-Finger Test and Heel to [facility] Test normal.     Gait: Gait is intact.  Psychiatric:        Behavior: Behavior normal.     ED Results / Procedures / Treatments   Labs (all labs ordered are listed, but only abnormal results are displayed) Labs Reviewed  PROTIME-INR - Abnormal; Notable for the following components:      Result Value   Prothrombin Time 15.4 (*)    All other components within normal limits  COMPREHENSIVE METABOLIC PANEL - Abnormal; Notable for the following components:   Potassium 3.3 (*)    Creatinine, Ser 1.32 (*)    Calcium 8.7 (*)    Total Bilirubin 1.8 (*)    All other components within normal limits  CBG MONITORING, ED - Abnormal; Notable for the following components:   Glucose-Capillary 123 (*)    All other components within normal limits  I-STAT CHEM 8, ED - Abnormal; Notable for the following components:   Potassium 3.4 (*)    Creatinine, Ser 1.40 (*)    All other components within normal limits  APTT  CBC  DIFFERENTIAL  ETHANOL  CBG MONITORING, ED    EKG EKG Interpretation Date/Time:  Wednesday March 11 2023 13:51:00 EDT Ventricular Rate:  128 PR Interval:    QRS Duration:  120 QT Interval:  347 QTC Calculation: 507 R Axis:   -11  Text Interpretation: Atrial fibrillation Nonspecific intraventricular conduction delay Consider anterior infarct No significant change since last tracing Confirmed by Gwyneth Sprout ([id number]) on 03/11/2023 1:54:24 PM  Radiology CT HEAD CODE STROKE WO CONTRAST  Result Date: 03/11/2023 CLINICAL DATA:  Code stroke. Neuro deficit, acute, stroke suspected. Right-sided weakness. EXAM: CT HEAD WITHOUT CONTRAST TECHNIQUE: Contiguous axial images were obtained from the base of the skull through the vertex without intravenous contrast. RADIATION DOSE REDUCTION: This exam was  performed according to the departmental dose-optimization program which includes automated exposure control, adjustment of the mA and/or kV according to patient size and/or use of iterative reconstruction technique. COMPARISON:  Head CT 01/24/2017 and MRI 12/24/2016 FINDINGS: Brain: A chronic infarct is again seen involving the posterior left frontal lobe and insula with mild ex vacuo dilatation of the left lateral ventricle. A small cortical infarct posteriorly in the right cerebral hemisphere at the junction of the temporal, occipital, and parietal lobes and a small cortical infarct in the posterior right frontal lobe are both  new but chronic in appearance. There is also a new small chronic right cerebellar infarct. No definite acute infarct, intracranial hemorrhage, mass, midline shift, or extra-axial fluid collection is identified. There is mild cerebral atrophy. Vascular: Calcified atherosclerosis at the skull base. No hyperdense vessel. Skull: No acute fracture or suspicious osseous lesion. Sinuses/Orbits: Paranasal sinuses and mastoid air cells are clear. Unremarkable orbits. Other: None. ASPECTS ([place] Stroke Program Early CT Score) - Ganglionic level infarction (caudate, lentiform nuclei, internal capsule, insula, M1-M3 cortex): 7 - Supraganglionic infarction (M4-M6 cortex): 3 Total score (0-10 with 10 being normal): 10 These results were communicated to Dr. Amada Jupiter at 2:10 pm on 03/11/2023 by text page via the [facility] messaging system. IMPRESSION: 1. No evidence of acute intracranial abnormality. ASPECTS of 10. 2. Multiple chronic infarcts as above. ASPECTS is Electronically Signed   By: Sebastian Ache M.D.   On: 03/11/2023 14:10    Procedures Procedures    Medications Ordered in ED Medications  sodium chloride flush (NS) 0.9 % injection 3 mL (3 mLs Intravenous Not Given 03/11/23 1421)  iohexol (OMNIPAQUE) 350 MG/ML injection 100 mL (100 mLs Intravenous Contrast Given 03/11/23 1403)    ED  Course/ Medical Decision Making/ A&P                                 Medical Decision Making Amount and/or Complexity of Data Reviewed External Data Reviewed: notes. Labs: ordered. Decision-making details documented in ED Course. Radiology: ordered and independent interpretation performed. Decision-making details documented in ED Course. ECG/medicine tests: ordered and independent interpretation performed. Decision-making details documented in ED Course.  Risk Prescription drug management. Decision regarding hospitalization.   Pt with multiple medical problems and comorbidities and presenting today with a complaint that caries a high risk for morbidity and mortality.  Here today with symptoms concerning for stroke.  Patient has severe aphasia today which appears to be expressive.  No other obvious abnormalities on exam.  Patient reports symptoms started around 8 and a code stroke was initiated.  Dr. Amada Jupiter with neurology evaluated the patient recommended CT as well as CT perfusion.  Patient does appear to be in A-fib RVR most likely related to uncontrolled A-fib that is known because he has been out of medications.  Patient is supposed to be on Entresto and Eliquis but has not had any in a while because of insurance.  I independently interpreted patient's labs and EKG which shows atrial fibrillation with RVR without other findings.  CBC within normal limits, Chem-8 without acute findings, CBG of 123, CMP without acute issues, CBC within normal limits. I have independently visualized and interpreted pt's images today.  CT is negative for acute bleed.  Radiology reports no evidence of acute abnormality but multiple chronic infarcts.  Spoke with Dr. Amada Jupiter with neurology and he reports no evidence of LVO at this time.  Patient is otherwise outside of the window for tPA.  He will need admission for stroke workup and will need to go to [facility].  Hospitalist consulted for admission.  Findings  discussed with the patient.  He is comfortable with this plan.          Final Clinical Impression(s) / ED Diagnoses Final diagnoses:  Cerebrovascular accident (CVA), unspecified mechanism (HCC)  Aphasia  Chronic atrial fibrillation with RVR (HCC)    Rx / DC Orders ED Discharge Orders     None  Gwyneth Sprout, MD 03/11/23 1428    Gwyneth Sprout, MD 03/11/23 [phone]

## 2023-03-11 NOTE — ED Notes (Signed)
[facility] called to arrange transport to [facility] room 5W32C.

## 2023-03-11 NOTE — Progress Notes (Signed)
Patient back in room from CT department,  telemedicine cart with patient.

## 2023-03-11 NOTE — Progress Notes (Signed)
Patient unable to tolerate MRI. Will try again in the morning.
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
## 2023-03-11 NOTE — Progress Notes (Signed)
TRH night cross cover note:   I was notified by RN that this patient, who was admitted earlier today for acute ischemic stroke, is complaining of anxiety and requesting medication to help him calm down.  I subsequently placed order for Ativan 0.5 mg IV every 6 hours as needed for anxiety.    Update: I was updated by the patient's RN that the patient returned from MRI complaining of some shortness of breath and was noted to have evidence of increased work of breathing, without reported complaint of chest pain.  Heart rates in the low 100s, blood pressure 103/86, respiratory rate in the low 20s, and oxygen saturation 95% on 2 L nasal cannula compared to prior oxygen saturations in the high 90s on 1 L nasal cannula.  Ensuing EKG was performed and, in comparison to most recent prior EKG from 1351 on 03/11/23, shows atrial fibrillation with heart rate 103, T wave flattening in lead I, which appears unchanged from most recent prior EKG, nonspecific T wave inversion in aVL, as well as nonspecific less than 1 mm ST elevation in leads V1 and V2, unchanged from most recent prior EKG, will demonstrating no evidence of new ST changes.  Per chart review, it appears that the patient's atrial fibrillation is chronic, and that his home metoprolol succinate has been resumed, with first dose administered on the afternoon of 03/11/23.  He is scheduled for his next dose of home metoprolol succinate at 10 AM on 03/12/2023.  He is also chronically anticoagulated on Eliquis.  I subsequently added an order for prn IV Lopressor for sustained heart rates greater than 120 bpm.   Additionally, in the setting of a documented history of chronic diastolic heart failure, I also ordered a stat chest x-ray in addition to BNP.  RN conveys that the patient was also noted to be using a THC vape in his patient room. The vape has been confiscated and given to security.     Update: per formal radiology read, this evening's chest x-ray,  relative to most recent prior chest x-ray from 11/28/2022, shows interval central pulmonary venous congestion as well as interval evidence of interstitial edema, suggestive of interval development of early heart failure.   It appears that the patient is on Lasix 40 mg p.o. twice daily as an outpatient.  I subsequently ordered Lasix 40 mg IV twice daily, first dose now.   Additionally, per chart review, his serum potassium level at time of admission was 3.3.  I have ordered potassium chloride 40 meq p.o. x 1 dose now and ordered BMP and serum magnesium levels to be checked with morning labs.    Newton Pigg, DO Hospitalist

## 2023-03-12 ENCOUNTER — Inpatient Hospital Stay (HOSPITAL_COMMUNITY): Payer: MEDICAID

## 2023-03-12 ENCOUNTER — Other Ambulatory Visit (HOSPITAL_COMMUNITY): Payer: Self-pay

## 2023-03-12 DIAGNOSIS — I4821 Permanent atrial fibrillation: Secondary | ICD-10-CM

## 2023-03-12 DIAGNOSIS — I5043 Acute on chronic combined systolic (congestive) and diastolic (congestive) heart failure: Secondary | ICD-10-CM

## 2023-03-12 DIAGNOSIS — I5021 Acute systolic (congestive) heart failure: Secondary | ICD-10-CM

## 2023-03-12 DIAGNOSIS — I6389 Other cerebral infarction: Secondary | ICD-10-CM

## 2023-03-12 DIAGNOSIS — I428 Other cardiomyopathies: Secondary | ICD-10-CM

## 2023-03-12 LAB — BASIC METABOLIC PANEL
Anion gap: 12 (ref 5–15)
BUN: 16 mg/dL (ref 8–23)
CO2: 24 mmol/L (ref 22–32)
Calcium: 8.6 mg/dL — ABNORMAL LOW (ref 8.9–10.3)
Chloride: 101 mmol/L (ref 98–111)
Creatinine, Ser: 1.38 mg/dL — ABNORMAL HIGH (ref 0.61–1.24)
GFR, Estimated: 57 mL/min — ABNORMAL LOW (ref >60)
Glucose, Bld: 95 mg/dL (ref 70–99)
Potassium: 3.2 mmol/L — ABNORMAL LOW (ref 3.5–5.1)
Sodium: 137 mmol/L (ref 135–145)

## 2023-03-12 LAB — RAPID URINE DRUG SCREEN, HOSP PERFORMED
Amphetamines: POSITIVE — AB
Barbiturates: NOT DETECTED
Benzodiazepines: NOT DETECTED
Cocaine: NOT DETECTED
Opiates: NOT DETECTED
Tetrahydrocannabinol: POSITIVE — AB

## 2023-03-12 LAB — ECHOCARDIOGRAM COMPLETE
AR max vel: 1.68 cm2
AV Area VTI: 1.54 cm2
AV Area mean vel: 1.6 cm2
AV Mean grad: 8 mm[Hg]
AV Peak grad: 12.6 mm[Hg]
Ao pk vel: 1.78 m/s
Area-P 1/2: 7.9 cm2
Est EF: <20
Height: 75 in
MV VTI: 0.29 cm2
S' Lateral: 6.3 cm
Single Plane A4C EF: 12.4 %
Weight: 4144 [oz_av]

## 2023-03-12 LAB — LIPID PANEL
Cholesterol: 126 mg/dL (ref 0–200)
HDL: 32 mg/dL — ABNORMAL LOW (ref >40)
LDL Cholesterol: 81 mg/dL (ref 0–99)
Total CHOL/HDL Ratio: 3.9 {ratio}
Triglycerides: 65 mg/dL (ref <150)
VLDL: 13 mg/dL (ref 0–40)

## 2023-03-12 LAB — BRAIN NATRIURETIC PEPTIDE: B Natriuretic Peptide: 794.9 pg/mL — ABNORMAL HIGH (ref 0.0–100.0)

## 2023-03-12 LAB — MAGNESIUM: Magnesium: 1.9 mg/dL (ref 1.7–2.4)

## 2023-03-12 MED ORDER — METOPROLOL TARTRATE 5 MG/5ML IV SOLN: 5.0000 mg | INTRAVENOUS | Status: DC | PRN

## 2023-03-12 MED ORDER — LORAZEPAM 2 MG/ML IJ SOLN
1.0000 mg | INTRAMUSCULAR | Status: AC | PRN
  Administered 2023-03-12: 1 mg via INTRAVENOUS
  Filled 2023-03-12: qty 1

## 2023-03-12 MED ORDER — APIXABAN 5 MG PO TABS
5.0000 mg | ORAL_TABLET | Freq: Two times a day (BID) | ORAL | Status: DC
  Administered 2023-03-12 – 2023-03-13 (×2): 5 mg via ORAL
  Filled 2023-03-12 (×2): qty 1

## 2023-03-12 MED ORDER — FUROSEMIDE 10 MG/ML IJ SOLN
40.0000 mg | Freq: Two times a day (BID) | INTRAMUSCULAR | Status: DC
  Administered 2023-03-12 (×3): 40 mg via INTRAVENOUS
  Filled 2023-03-12 (×3): qty 4

## 2023-03-12 MED ORDER — POTASSIUM CHLORIDE CRYS ER 20 MEQ PO TBCR
40.0000 meq | EXTENDED_RELEASE_TABLET | Freq: Two times a day (BID) | ORAL | Status: DC
  Administered 2023-03-12 – 2023-03-13 (×2): 40 meq via ORAL
  Filled 2023-03-12 (×2): qty 2

## 2023-03-12 MED ORDER — ROSUVASTATIN CALCIUM 20 MG PO TABS
20.0000 mg | ORAL_TABLET | Freq: Every day | ORAL | Status: DC
  Administered 2023-03-12 – 2023-03-13 (×2): 20 mg via ORAL
  Filled 2023-03-12 (×2): qty 1

## 2023-03-12 MED ORDER — POTASSIUM CHLORIDE CRYS ER 20 MEQ PO TBCR
40.0000 meq | EXTENDED_RELEASE_TABLET | Freq: Once | ORAL | Status: AC
  Administered 2023-03-12: 40 meq via ORAL
  Filled 2023-03-12: qty 2

## 2023-03-12 MED ORDER — PERFLUTREN LIPID MICROSPHERE
1.0000 mL | INTRAVENOUS | Status: AC | PRN
  Administered 2023-03-12: 5 mL via INTRAVENOUS

## 2023-03-12 NOTE — Progress Notes (Addendum)
STROKE TEAM PROGRESS NOTE   BRIEF HPI 62 year old male with a history of hypertension, CHF, previous stroke who presents with right-sided weakness and difficulty speaking that started this morning around 8 AM.  He apparently woke up normal, but then started having trouble this morning, but did not come in to get it checked out immediately.Patient was seen via teleneurology He has a history of atrial fibrillation and is supposed to be taking Eliquis, but stated that he was not able to afford it. Outside of the window for TNK, no thrombectomy offered due to no LVO.    INTERIM HISTORY/SUBJECTIVE  Patient was not taking any of his medications, including multiple cardiac meds. He recently moved to the area after working in [place]. Could not decipher whether he could not afford them, but patient said he didn't like taking a "bunch of medication". Will incorportate social work assistance to ensure patient can afford meds offered and understands the importance of medication compliance. Will transistion back to Eliquis starting tomorrow   Patient very sleepy on exam (had just received Ativan for MRI)  Patient would answer questions correctly and would follow commands x 1 but then will go right back to sleep.  Speech is still slightly dysarthric (due to recent Ativan?), minimal right-sided arm weakness noted, with an effort-dependant exam.   OBJECTIVE  CBC    Component Value Date/Time   WBC 7.3 03/11/2023 1346   RBC 4.78 03/11/2023 1346   HGB 15.3 03/11/2023 1353   HGB 16.4 12/15/2017 1352   HCT 45.0 03/11/2023 1353   HCT 49.8 12/15/2017 1352   PLT 205 03/11/2023 1346   PLT 236 12/15/2017 1352   MCV 93.7 03/11/2023 1346   MCV 87 12/15/2017 1352   MCH 30.1 03/11/2023 1346   MCHC 32.1 03/11/2023 1346   RDW 14.3 03/11/2023 1346   RDW 13.8 12/15/2017 1352   LYMPHSABS 1.3 03/11/2023 1346   MONOABS 0.6 03/11/2023 1346   EOSABS 0.2 03/11/2023 1346   BASOSABS 0.1 03/11/2023 1346    BMET     Component Value Date/Time   NA 137 03/12/2023 0339   NA 141 12/15/2017 1352   K 3.2 (L) 03/12/2023 0339   CL 101 03/12/2023 0339   CO2 24 03/12/2023 0339   GLUCOSE 95 03/12/2023 0339   BUN 16 03/12/2023 0339   BUN 18 12/15/2017 1352   CREATININE 1.38 (H) 03/12/2023 0339   CALCIUM 8.6 (L) 03/12/2023 0339   GFRNONAA 57 (L) 03/12/2023 0339    IMAGING past 24 hours DG Chest Port 1 View  Result Date: 03/12/2023 CLINICAL DATA:  Sudden onset respiratory distress, initial encounter EXAM: PORTABLE CHEST 1 VIEW COMPARISON:  11/28/2022 FINDINGS: Cardiac shadow is enlarged. Central vascular congestion is noted with interstitial edema consistent with CHF. Postsurgical changes are noted. No bony abnormality is seen. IMPRESSION: Changes of CHF new from the prior exam. Electronically Signed   By: Alcide Clever M.D.   On: 03/12/2023 00:36   CT ANGIO HEAD NECK W WO CM W PERF (CODE STROKE)  Result Date: 03/11/2023 CLINICAL DATA:  Neuro deficit, acute, stroke suspected. Right-sided weakness. EXAM: CT ANGIOGRAPHY HEAD AND NECK CT PERFUSION BRAIN TECHNIQUE: Multidetector CT imaging of the head and neck was performed using the standard protocol during bolus administration of intravenous contrast. Multiplanar CT image reconstructions and MIPs were obtained to evaluate the vascular anatomy. Carotid stenosis measurements (when applicable) are obtained utilizing NASCET criteria, using the distal internal carotid diameter as the denominator. Multiphase CT imaging of the brain  was performed following IV bolus contrast injection. Subsequent parametric perfusion maps were calculated using RAPID software. RADIATION DOSE REDUCTION: This exam was performed according to the departmental dose-optimization program which includes automated exposure control, adjustment of the mA and/or kV according to patient size and/or use of iterative reconstruction technique. CONTRAST:  OMNIPAQUE IOHEXOL 350 MG/ML SOLN COMPARISON:  MRA  head and CTA neck 12/24/2016 FINDINGS: CTA NECK FINDINGS Aortic arch: Normal variant aortic arch branching pattern with common origin of the brachiocephalic and left common carotid arteries. Mild atherosclerosis without a significant stenosis of the arch vessel origins. Right carotid system: Patent with a small amount of calcified plaque at the carotid bifurcation. No evidence of a significant stenosis or dissection. Left carotid system: Patent with minimal calcified and soft plaque at the carotid bifurcation. No evidence of a significant stenosis or dissection. Vertebral arteries: Patent without evidence of a significant stenosis or dissection. Strongly dominant right vertebral artery which does not enter the transverse foramina until the C4 level. Skeleton: Moderate cervical spondylosis. Other neck: No evidence of cervical lymphadenopathy or mass. Upper chest: Clear lung apices. Review of the MIP images confirms the above findings CTA HEAD FINDINGS Anterior circulation: The internal carotid arteries are patent from skull base to carotid termini with mild atherosclerosis bilaterally not resulting in significant stenosis. ACAs and MCAs are patent without evidence of a proximal branch occlusion or significant proximal stenosis. No aneurysm is identified. Posterior circulation: The intracranial right vertebral artery is widely patent and supplies the basilar. The left vertebral artery is hypoplastic and functionally terminates in PICA. Patent PICA and SCA origins are visualized bilaterally. The basilar artery is patent and congenitally small without a significant focal stenosis. There are large posterior communicating arteries and hypoplastic P1 segments bilaterally. Both PCAs are patent without evidence of a significant proximal stenosis. No aneurysm is identified. Venous sinuses: As permitted by contrast timing, patent. Anatomic variants: Fetal origin of the PCAs. Hypoplastic left vertebral artery. Review of the MIP  images confirms the above findings CT Brain Perfusion Findings: ASPECTS: 10 CBF (<30%) Volume: 0 mL Perfusion (Tmax>6.0s) volume: 76 mL, scattered in a patchy and largely symmetric fashion throughout both cerebral hemispheres and in the posterior fossa and favored to be artifactual. Mismatch Volume: 76 mL and likely artifactual as noted above Infarction Location: No acute core infarct identified by CTP. Findings discussed with Dr. Amada Jupiter by telephone on 03/11/2023 at 2:20 p.m. IMPRESSION: 1. Mild atherosclerosis in the head and neck without a large vessel occlusion or significant proximal stenosis. 2. No evidence of an acute core infarct by CTP. Likely artifactual mismatch volume. 3.  Aortic Atherosclerosis (ICD10-I70.0). Electronically Signed   By: Sebastian Ache M.D.   On: 03/11/2023 14:44   CT HEAD CODE STROKE WO CONTRAST  Result Date: 03/11/2023 CLINICAL DATA:  Code stroke. Neuro deficit, acute, stroke suspected. Right-sided weakness. EXAM: CT HEAD WITHOUT CONTRAST TECHNIQUE: Contiguous axial images were obtained from the base of the skull through the vertex without intravenous contrast. RADIATION DOSE REDUCTION: This exam was performed according to the departmental dose-optimization program which includes automated exposure control, adjustment of the mA and/or kV according to patient size and/or use of iterative reconstruction technique. COMPARISON:  Head CT 01/24/2017 and MRI 12/24/2016 FINDINGS: Brain: A chronic infarct is again seen involving the posterior left frontal lobe and insula with mild ex vacuo dilatation of the left lateral ventricle. A small cortical infarct posteriorly in the right cerebral hemisphere at the junction of the temporal, occipital, and  parietal lobes and a small cortical infarct in the posterior right frontal lobe are both new but chronic in appearance. There is also a new small chronic right cerebellar infarct. No definite acute infarct, intracranial hemorrhage, mass, midline  shift, or extra-axial fluid collection is identified. There is mild cerebral atrophy. Vascular: Calcified atherosclerosis at the skull base. No hyperdense vessel. Skull: No acute fracture or suspicious osseous lesion. Sinuses/Orbits: Paranasal sinuses and mastoid air cells are clear. Unremarkable orbits. Other: None. ASPECTS (Alberta Stroke Program Early CT Score) - Ganglionic level infarction (caudate, lentiform nuclei, internal capsule, insula, M1-M3 cortex): 7 - Supraganglionic infarction (M4-M6 cortex): 3 Total score (0-10 with 10 being normal): 10 These results were communicated to Dr. Amada Jupiter at 2:10 pm on 03/11/2023 by text page via the [facility] messaging system. IMPRESSION: 1. No evidence of acute intracranial abnormality. ASPECTS of 10. 2. Multiple chronic infarcts as above. ASPECTS is Electronically Signed   By: Sebastian Ache M.D.   On: 03/11/2023 14:10    Vitals:   03/11/23 2000 03/11/23 2055 03/11/23 2349 03/12/23 0000  BP: (!) 119/102 (!) 119/53    Pulse: (!) 106 (!) 105    Resp: 20 20    Temp: 98.2 F (36.8 C) 98.5 F (36.9 C) 97.7 F (36.5 C) 98.8 F (37.1 C)  TempSrc: Oral Oral  Oral  SpO2: 99% 99%    Weight:      Height:         PHYSICAL EXAM General:  Alert, well-nourished, well-developed patient in no acute distress Psych:  Mood and affect appropriate for situation CV: Regular rate and rhythm on monitor Respiratory:  Regular, unlabored respirations on room air GI: Abdomen soft and nontender   NEURO:  Mental Status: Lethargic, oriented to place, age, year.  Patient is a poor historian, poor attention and concentration.  Follows commands x 1 then falls back asleep. Speech/Language: slight dysarthric speech (which could be attributed to recent Ativan) No Aphasia.  Cranial Nerves:  II: PERRL.  Uncooperative with visual exam III, IV, VI: EOMI. Eyelids elevate symmetrically.  V: Sensation is intact to light touch and symmetrical to face.  VII: Face is symmetrical resting  and smiling VIII: hearing intact to voice. IX, X: Palate elevates symmetrically. Phonation is normal.  VH:QIONGEXB shrug 5/5. XII: tongue is midline without fasciculations. Motor: effort dependent exam  RUE: 4/5, 4/5 grips, no drift (effort-dependant) LUE: 5/5 with 5/5 grip, no drift RLE: 5/5 with no drift LLE: 5/5 with no drift.  Tone: is normal and bulk is normal Sensation- Intact to light touch bilaterally.  Coordination: uncooperative. Gait- deferred  ASSESSMENT/PLAN  Speech difficulty - TIA vs. Related to SOB from heart failure Patient denies right-sided weakness as reported in the H&P Code Stroke CT head No acute abnormality.  CTA head & neck/CT Perfusion: No LVO. No acute core infarct by CTP.  MRI: No acute infarct 2D Echo: LVEF is less than 20%, global hypokinesis, no LV thrombus LDL 81 HgbA1c 5.6 UDS + amphetamine and THC VTE prophylaxis - lovenox No antithrombotics prior to admission, now on Eliquis. Social worker is helping for financial support with meds Therapy recommendations:  Pending Disposition:  pending  Hx of Atrial fibrillation Home Meds: Supposed to be on eliquis, but patient not able to afford due to financial difficulty Continue telemetry monitoring Start anticoagulation with eliquis and social worker is helping for financial support with meds  CHF EF < 20% Pt still has mild respiratory distress Cardiology consulted Now on Eliquis  History  of stroke 12/2016 admitted for bilateral ACA and left MCA 3 tiny cortical infarcts.  MRA head negative, carotid Doppler negative.  EF 20%.  LDL 102, A1c 5.4.  Discharged on aspirin and Eliquis  Hypertension Home meds:  lasix Stable On metoprolol 50 Cardiology on board for GDMT Long-term BP goal normotensive  Hyperlipidemia Home meds:  none LDL 81, goal < 70 Add Crestor 20mg   Continue statin at discharge  Substance abuse UDS positive for amphetamine and THC Substance cessation education  provided Patient is willing to quit  Other Stroke Risk Factors ETOH use, alcohol level <10, advised to drink no more than 1-2 drink(s) a day Obesity, Body mass index is 32.37 kg/m., BMI >/= 30 associated with increased stroke risk, recommend weight loss, diet and exercise as appropriate   Other acute issues Thoracic aortic aneurysm status post repair   Hospital day # 1   Pt seen by Neuro NP/APP and later by MD. Note/plan to be edited by MD as needed.    Lynnae January, DNP, AGACNP-BC [facility] Please use AMION for contact information & EPIC for messaging.  ATTENDING NOTE: I reviewed above note and agree with the assessment and plan. Pt was seen and examined.   NTs are at the bedside.  No family at bedside.  Patient sitting in chair, still has mild respite distress, likely from heart failure.  2D echo showed EF less than 20%.  He is not taking medication at home except Lasix due to financial difficulty.  On exam neuro intact no focal deficit.  MRI no acute infarct.  Patient denies any right-sided weakness and stated that his speech difficulty was due to shortness of breath.  He has a history of A-fib and CHF, however not compliant with medication due to cost.  Resume his Eliquis today and discussed with Child psychotherapist and will have him with [facility] program for co-pay card.  Added Crestor 20.  Educated on medication compliance.  PT and OT pending.  Cardiology on board for heart failure management.  For detailed assessment and plan, please refer to above/below as I have made changes wherever appropriate.   Neurology will sign off. Please call with questions. Pt will follow up with stroke clinic NP at [facility] in about 4 weeks. Thanks for the consult.   Marvel Plan, MD PhD Stroke Neurology 03/12/2023 6:34 PM     To contact Stroke Continuity provider, please refer to [email or web address]. After hours, contact General Neurology

## 2023-03-12 NOTE — Progress Notes (Signed)
PT Cancellation Note  Patient Details Name: Caleb [name] MRN: [id number] DOB: 1961-04-27   Cancelled Treatment:    Reason Eval/Treat Not Completed: Fatigue/lethargy limiting ability to participate  Patient sleeping soundly. Arouses to open eyes and then immediately back to sleep. Will continue efforts to complete PT evaluation.    Jerolyn Center, PT Acute Rehabilitation Services  Office [phone]  Caleb [name] 03/12/2023, 2:14 PM

## 2023-03-12 NOTE — Progress Notes (Signed)
PT Cancellation Note  Patient Details Name: SUHAN PACI MRN: [id number] DOB: 10-Jan-1961   Cancelled Treatment:    Reason Eval/Treat Not Completed: Patient at procedure or test/unavailable  Patient off the floor. Will continue efforts    Jerolyn Center, PT Acute Rehabilitation Services  Office [phone]   Zena Amos 03/12/2023, 8:52 AM

## 2023-03-12 NOTE — Progress Notes (Signed)
*  PRELIMINARY RESULTS* Echocardiogram 2D Echocardiogram has been performed.  Caleb [name] 03/12/2023, 9:57 AM

## 2023-03-12 NOTE — Progress Notes (Signed)
Pharmacy Consult for Apixaban Indication: atrial fibrillation  No Known Allergies  Patient Measurements: Height: 6\' 3"  (190.5 cm) Weight: 117.5 kg (259 lb) IBW/kg (Calculated) : 84.5    Vital Signs: Temp: 97.6 F (36.4 C) (10/03 1622) Temp Source: Oral (10/03 1622) BP: 101/80 (10/03 1622) Pulse Rate: 105 (10/03 1622)  Labs: Recent Labs    03/11/23 1346 03/11/23 1353 03/12/23 0339  HGB 14.4 15.3  --   HCT 44.8 45.0  --   PLT 205  --   --   APTT 27  --   --   LABPROT 15.4*  --   --   INR 1.2  --   --   CREATININE 1.32* 1.40* 1.38*    Estimated Creatinine Clearance: 75.7 mL/min (A) (by C-G formula based on SCr of 1.38 mg/dL (H)).  Assessment: Caleb [name] a 62 y.o. male presented with stroke-like symptoms in setting of known atrial fibrillation, non-compliant with Eliquis PTA due to issues with affordability. Pharmacy has been consulted for apixaban dosing.   Plan:  START apixaban 5 mg PO BID per PTA regimen  Price check for Eliquis in AM- discuss medication compliance and affordability   CBC in AM   Jani Gravel, PharmD Clinical Pharmacist  03/12/2023 4:26 PM

## 2023-03-12 NOTE — Progress Notes (Signed)
   Heart Failure Stewardship Pharmacist Progress Note   PCP: Kallie Locks, FNP PCP-Cardiologist: Thurmon Fair, MD    HPI:  62 yo M with PMH of CHF, HTN, afib, and prior CVA.  Admitted 12/2016 with acute CVA in bilateral frontal and left parietal lobes. EF 20%. Prior to this, he was receiving care in [place]. Unsure if this was a new diagnosis for him. EF improved some to 30-35% in 03/2017 after starting GDMT.   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   EF has remained 20-30% with inconsistency taking medications.   He reports he was seeing a cardiologist in [place] again but has been without most medications for a month or so since his Medicaid is out of state.  Presented to the ED on 10/2 with aphasia and right sided weakness. CT head without acute intracranial abnormalities. No LVO on CTA. Outside of the window for tPA. MRI brain without acute intracranial abnormalities. CXR with central vascular congestion and interstitial edema. BNP elevated. ECHO 10/3 showed LVEF <20%, RV moderately reduced, moderate MR, s/p bentall procedure with bioprosthetic aortic valve.   Current HF Medications: Diuretic: furosemide 40 mg IV BID Beta Blocker: metoprolol XL 50 mg daily  Prior to admission HF Medications: Diuretic: furosemide 40 mg BID *not taking metoprolol or digoxin  Pertinent Lab Values: Serum creatinine 1.38, BUN 16, Potassium 3.2, Sodium 137, BNP 794.9, Magnesium 1.9, A1c 5.6   Vital Signs: Weight: 259 lbs (admission weight: 259 lbs) Blood pressure: 110-120/90s  Heart rate: 90-110s  I/O: not recorded yesterday; net -0.6L since admission  Medication Assistance / Insurance Benefits Check: Does the patient have prescription insurance?  Yes Type of insurance plan: reports he has out of state Medicaid  Outpatient Pharmacy:  Prior to admission [facility] Is the patient willing to use [facility] TOC pharmacy at discharge?  Yes Is the patient willing to transition their outpatient pharmacy to utilize a [facility] outpatient pharmacy?   Pending    Assessment: 1. Acute on chronic systolic CHF (LVEF <20%), due to NICM. NYHA class IV symptoms. - Continue furosemide 40 mg IV BID. Strict I/Os and daily weights. Keep K>4 and Mg>2. - Continue metoprolol XL 50 mg daily - Consider starting Entresto 24/26 mg BID tomorrow pending renal function. May need patient assistance pending benefits check. - Consider starting spironolactone and Jardiance 10 mg daily prior to discharge   Plan: 1) Medication changes recommended at this time: - Continue IV diuresis  2) Patient assistance: - May have out of state Medicaid - benefits investigation underway  3)  Education  - To be completed prior to discharge  Sharen Hones, PharmD, BCPS Heart Failure Engineer, building services Phone [phone]

## 2023-03-12 NOTE — Evaluation (Signed)
Occupational Therapy Evaluation Patient Details Name: Caleb [name] MRN: [id number] DOB: 12-17-1960 Today's Date: 03/12/2023   History of Present Illness 62 y.o. male being admitted 03/11/23 to the hospital with concern for acute CVA. CTA head and neck with prior chronic infarction, but no acute occlusion or CVA. Echo EF <20% MRI brain limited but negative for acute changes  PMH significant for heart failure, hypertension, atrial fibrillation, AAA, CVA   Clinical Impression   PTA pt lives alone independently. States he could not afford his medication therefore stopped taking them. States he was having difficulty breathing and felt extremely anxious and "hasn't slept for 3 days". Pt complaining of fatigue however is able to complete ADL and mobility with CGA. SpO2 remaining above 90 on 2L however pt demonstrates increased SOB with activity. BP supine 115/99; sitting 120/95; after activity 124/96. Acute OT will follow to facilitate a safe DC home however do not anticipate the need for OT after DC.       If plan is discharge home, recommend the following:      Functional Status Assessment  Patient has had a recent decline in their functional status and demonstrates the ability to make significant improvements in function in a reasonable and predictable amount of time.  Equipment Recommendations  Tub/shower seat    Recommendations for Other Services PT consult     Precautions / Restrictions Precautions Precautions: Fall Precaution Comments: watch O2  Not on O2 at baseline     Mobility Bed Mobility Overal bed mobility: Modified Independent                  Transfers Overall transfer level: Needs assistance   Transfers: Sit to/from Stand, Bed to chair/wheelchair/BSC Sit to Stand: Supervision           General transfer comment: mobility with CGA      Balance Overall balance assessment: Needs assistance   Sitting balance-Leahy Scale: Good       Standing  balance-Leahy Scale: Fair                             ADL either performed or assessed with clinical judgement   ADL Overall ADL's : Needs assistance/impaired Eating/Feeding: Independent   Grooming: Set up   Upper Body Bathing: Set up;Sitting   Lower Body Bathing: Contact guard assist;Sit to/from stand   Upper Body Dressing : Set up   Lower Body Dressing: Sit to/from stand;Contact guard assist   Toilet Transfer: Contact guard assist           Functional mobility during ADLs: Contact guard assist       Vision Baseline Vision/History: 0 No visual deficits       Perception         Praxis         Pertinent Vitals/Pain Pain Assessment Pain Assessment: No/denies pain     Extremity/Trunk Assessment Upper Extremity Assessment Upper Extremity Assessment: Defer to OT evaluation   Lower Extremity Assessment Lower Extremity Assessment: Overall WFL for tasks assessed   Cervical / Trunk Assessment Cervical / Trunk Assessment: Other exceptions (forward head)   Communication Communication Communication: Difficulty communicating thoughts/reduced clarity of speech (complains of speech being "slurred" Speech worsened when talking on the phone to his friend)   Cognition Arousal: Lethargic, Suspect due to medications (Ativan earlier today) Behavior During Therapy: Flat affect, Anxious; "It's horrible being all alone" Overall Cognitive Status: Within Functional Limits for tasks assessed  General Comments   Does not appear to have a good understanding of management of CHF    Exercises     Shoulder Instructions      Home Living Family/patient expects to be discharged to:: Private residence Living Arrangements: Alone Available Help at Discharge: Friend(s);Available PRN/intermittently Type of Home: Apartment Home Access: Stairs to enter [employer] of Steps: 2 flights Entrance Stairs-Rails:  Right;Left Home Layout: One level     Bathroom Shower/Tub: Producer, television/film/video: Standard Bathroom Accessibility: Yes How Accessible: Accessible via walker Home Equipment: None          Prior Functioning/Environment Prior Level of Function : Independent/Modified Independent                        OT Problem List: Decreased activity tolerance;Cardiopulmonary status limiting activity      OT Treatment/Interventions: Self-care/ADL training;Therapeutic exercise;Energy conservation;DME and/or AE instruction;Therapeutic activities;Patient/family education    OT Goals(Current goals can be found in the care plan section) Acute Rehab OT Goals Patient Stated Goal: to get back on his medicines OT Goal Formulation: With patient Time For Goal Achievement: 03/26/23 Potential to Achieve Goals: Good  OT Frequency: Min 1X/week    Co-evaluation              AM-PAC OT "6 Clicks" Daily Activity     Outcome Measure Help from another person eating meals?: None Help from another person taking care of personal grooming?: A Little Help from another person toileting, which includes using toliet, bedpan, or urinal?: A Little Help from another person bathing (including washing, rinsing, drying)?: A Little Help from another person to put on and taking off regular upper body clothing?: A Little Help from another person to put on and taking off regular lower body clothing?: A Little 6 Click Score: 19   End of Session Equipment Utilized During Treatment: Gait belt Nurse Communication: Mobility status  Activity Tolerance: Patient tolerated treatment well Patient left: in bed;with call bell/phone within reach;with bed alarm set  OT Visit Diagnosis: Unsteadiness on feet (R26.81)                Time: 1810-1830 OT Time Calculation (min): 20 min Charges:  OT General Charges $OT Visit: 1 Visit OT Evaluation $OT Eval Moderate Complexity: 1 Mod  Renold Kozar, OT/L   Acute  OT Clinical Specialist Acute Rehabilitation Services Pager [phone] Office [phone]   [facility] 03/12/2023, 7:00 PM

## 2023-03-12 NOTE — TOC Progression Note (Signed)
Transition of Care [facility]) - Progression Note    Patient Details  Name: Caleb [name] MRN: [id number] Date of Birth: 06/03/1961  Transition [facility]) CM/SW Contact  Gordy Clement, RN Phone Number: 03/12/2023, 3:53 PM  Clinical Narrative:     [employer] MEDICATION ASSISTANCE CARD Pharmacies please call [phone] for claim processing assistance.  Rx BIN: [id number] Rx Group: [id number] Rx PCN: [id number] Relationship Code: 1 Person Code: [id number]  Patient ID (MRN): [id number]    Patient Name: Caleb [name]   Patient DOB: [birth year]   Discharge Date: 03/12/2023  Expiration Date: 03/20/2023 (must be filled within 7 days of discharge)   Dear [name] have been approved to have the prescriptions written by your discharging physician filled through our [facility] (Medication Assistance Through [facility]) program. This program allows for a one-time (no refills) 34-day supply of selected medications for a low copay amount.  The copay is $3.00 per prescription. For instance, if you have one prescription, you will pay $3.00; for two prescriptions, you pay $6.00; for three prescriptions, you pay $9.00; and so on. Only certain pharmacies are participating in this program with [facility]. You will need to select one of the pharmacies from the attached lists and take your prescriptions, this letter, and your photo ID to one of the participating pharmacies.  We are excited that you are able to use the [facility] program to get your medications. These prescriptions must be filled within 7 days of hospital discharge or they will no longer be valid for the [facility] program. Should you have any problems with your prescriptions please contact your case management team member at [phone] for [place] or [phone] for [facility].  Thank you, [facility]    [facility] Pharmacies [facility], [facility], [address] [facility]  [facility]  Pharmacies [address] [place], [place] [address] [place], [place] [address] [place], [employer] [address] [place], [place] [address], [place] Other [facility] [address] [place], [place] [facility] [address] [facility] [address], [place]                          Expected Discharge Plan and Services                                               Social Determinants of Health (SDOH) Interventions SDOH Screenings   Tobacco Use: Low Risk  (03/11/2023)    Readmission Risk Interventions     No data to display

## 2023-03-12 NOTE — Consult Note (Addendum)
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 ICD.  Status post Bentall and LAA clipping 2019 Bicuspid aortic valve associated with moderate aortic regurgitation and thoracic aortic aneurysm  Needs to be on lifelong aspirin and likely Eliquis too given recurrent CVA.Will also need dental prophylaxis.  Check echo.   HLD 81. Continue statin.   AKI Maybe cardiorenal. Continue to follow. Currently 1.38   Risk Assessment/Risk Scores:   New York Heart Association (NYHA) Functional Class NYHA Class IV  CHA2DS2-VASc Score = 4  This indicates a 4.8% annual risk of stroke. The patient's score is based upon: CHF History: 1 HTN History: 1 Diabetes History: 0 Stroke History: 2 Vascular Disease History: 0 Age Score: 0 Gender Score: 0     For questions or updates, please contact [facility] Please consult [email or web address] for contact info under    Signed, Abagail Kitchens, PA-C  03/12/2023 11:14 AM

## 2023-03-12 NOTE — Progress Notes (Signed)
PROGRESS NOTE    DEMETRIE BORGE  ZOX:[id number] DOB: Feb 08, 1961 DOA: 03/11/2023 PCP: Kallie Locks, FNP    Chief Complaint  Patient presents with   Dysphagia    Brief Narrative:   Caleb [name] is a 62 y.o. male with medical history significant for heart failure with reduced EF 25-30%, hypertension, atrial fibrillation being admitted to the hospital with concern for acute CVA.  Patient states that the only medication he has been taking routinely is Lasix, he probably took Eliquis about a month ago.  Says that he has not been taking his metoprolol because it is " too much."  And has not been taking Eliquis due to the cost.  He lives by himself.  He woke up about 8 AM today, feeling normal.  Soon after that, he noticed to have was having some speech difficulty and he had some right sided numbness in his arm.  This resolved after about 5 minutes.  However his speech difficulty continued.  Denies any recent illness, fevers, chills, falls, difficulty walking.    Assessment & Plan:   Principal Problem:   Acute CVA (cerebrovascular accident) [facility]) Active Problems:   Permanent atrial fibrillation (HCC)   Acute on chronic combined systolic and diastolic CHF (congestive heart failure) (HCC)   NICM (nonischemic cardiomyopathy) (HCC)  Possible acute ischemic CVA  -Suspected acute ischemic stroke, as he presents with sudden dysarthria, right arm numbness, most likely in the setting of A-fib with noncompliance of medications and anticoagulation -Continue with aspirin,, decision about Eliquis pending MRI findings and neurology recommendation -PT/OT/SLP consult pending -MRI brain has been done, await official reading -Please see discussion below regarding echo findings   Persistent atrial fibrillation  History of left atrial clip  -With known history of atrial fibrillation, noncompliant with medication or anticoagulation  -Known low EF, so continue Toprol-XL 50 mg for heart rate  control -Management per cardiology no Amio unless heart rate is uncontrolled on walking agents -Awaiting MRI results before decision on resuming Eliquis.   Chronic HFrEF  Nonischemic cardiomyopathy -2D echo in the past significant for EF 25 to 30%, significantly low this admission at<20% -Management per cardiology -GDMT  once appropriate and outside of permissive hypertensive with now   Status post Bentall and LAA clipping 2019/ Bicuspid aortic valve associated with moderate aortic regurgitation and thoracic aortic aneurysm  Needs to be on lifelong aspirin and likely Eliquis too given recurrent CVA.Will also need dental prophylaxis.     HLD LDL is 81, continue with statin   AKI Baseline was around 140 years ago, it is 1.4 on admission  Hypertension -Allow for permissive hypertension  Medication noncompliance -TOC consulted, to help arrange for meds as an outpatient.   DVT prophylaxis: Lovenox Code Status: Full Family Communication: None at bedside Disposition:   Status is: Inpatient    Consultants:  Cardiology Neurology  Subjective:  Patient is somnolent, denies any chest pain, shortness of breath  Objective: Vitals:   03/11/23 2349 03/12/23 0000 03/12/23 0800 03/12/23 1222  BP:   (!) 128/98 (!) 116/96  Pulse:    (!) 104  Resp:   20 (!) 24  Temp: 97.7 F (36.5 C) 98.8 F (37.1 C)  (!) 97.1 F (36.2 C)  TempSrc:  Oral  Axillary  SpO2:    96%  Weight:      Height:        Intake/Output Summary (Last 24 hours) at 03/12/2023 1558 Last data filed at 03/12/2023 1509 Gross per 24 hour  Intake --  Output 1000 ml  Net -1000 ml   Filed Weights   03/11/23 1949  Weight: 117.5 kg    Examination:  Patient is somnolent as he received Ativan in preparation for MRI, but he wakes up and answer questions and go back to sleep. Symmetrical Chest wall movement, Good air movement bilaterally, CTAB IRREGULAR, No Gallops, No Parasternal Heave +ve B.Sounds, Abd Soft, No  tenderness, No rebound - guarding or rigidity. No Cyanosis, Clubbing or edema, No new Rash or bruise      Data Reviewed: I have personally reviewed following labs and imaging studies  CBC: Recent Labs  Lab 03/11/23 1346 03/11/23 1353  WBC 7.3  --   NEUTROABS 5.1  --   HGB 14.4 15.3  HCT 44.8 45.0  MCV 93.7  --   PLT 205  --     Basic Metabolic Panel: Recent Labs  Lab 03/11/23 1346 03/11/23 1353 03/12/23 0339  NA 138 141 137  K 3.3* 3.4* 3.2*  CL 105 104 101  CO2 27  --  24  GLUCOSE 89 89 95  BUN 18 17 16   CREATININE 1.32* 1.40* 1.38*  CALCIUM 8.7*  --  8.6*  MG  --   --  1.9    GFR: Estimated Creatinine Clearance: 75.7 mL/min (A) (by C-G formula based on SCr of 1.38 mg/dL (H)).  Liver Function Tests: Recent Labs  Lab 03/11/23 1346  AST 31  ALT 25  ALKPHOS 44  BILITOT 1.8*  PROT 7.0  ALBUMIN 3.7    CBG: Recent Labs  Lab 03/11/23 1344  GLUCAP 123*     No results found for this or any previous visit (from the past 240 hour(s)).       Radiology Studies: MR BRAIN WO CONTRAST  Result Date: 03/12/2023 CLINICAL DATA:  Stroke, follow up EXAM: MRI HEAD WITHOUT CONTRAST TECHNIQUE: Multiplanar, multiecho pulse sequences of the brain and surrounding structures were obtained without intravenous contrast. COMPARISON:  Same day CT head and head/neck angiogram FINDINGS: Limitations: Motion degraded exam. Lack of FLAIR and susceptibility weighted sequences further limits assessment. Brain: No acute infarction, hemorrhage, hydrocephalus, extra-axial collection or mass lesion. Advanced generalized volume loss. 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 Vascular: Normal flow voids. Skull and upper cervical spine: Normal marrow signal. Small lipoma the left frontal scalp Sinuses/Orbits: Negative. Other: None. IMPRESSION: Within the limitations above, no acute intracranial abnormality. Electronically Signed   By: Lorenza Cambridge M.D.   On: 03/12/2023 13:38    ECHOCARDIOGRAM COMPLETE  Result Date: 03/12/2023    ECHOCARDIOGRAM REPORT   Patient Name:   LORENCE NAGENGAST Date of Exam: 03/12/2023 Medical Rec #:  [id number]     Height:       75.0 in Accession #:    [id number]    Weight:       259.0 lb Date of Birth:  1960/11/24      BSA:          2.449 m Patient Age:    62 years      BP:           119/50 mmHg Patient Gender: M             HR:           110 bpm. Exam Location:  Inpatient Procedure: 2D Echo, Cardiac Doppler, Color Doppler, 3D Echo, Intracardiac            Opacification Agent and Strain Analysis Indications:  Stroke I63.9  History:        Patient has prior history of Echocardiogram examinations, most                 recent 01/17/2018. CHF and Cardiomyopathy, Stroke,                 Arrythmias:Atrial Fibrillation; Risk Factors:Non-Smoker.  Sonographer:    Dondra Prader RVT RCS Referring Phys: [facility], MIR, M IMPRESSIONS  1. Left ventricular ejection fraction, by estimation, is <20%. The left ventricle has severely decreased function. The left ventricle demonstrates global hypokinesis. The left ventricular internal cavity size was severely dilated. Left ventricular diastolic parameters are indeterminate. No LV thrombus.  2. Right ventricular systolic function is moderately reduced. The right ventricular size is mildly enlarged. Tricuspid regurgitation signal is inadequate for assessing PA pressure.  3. Left atrial size was severely dilated.  4. Right atrial size was severely dilated.  5. The mitral valve is normal in structure. Moderate mitral valve regurgitation, posteriorly-directed and may be incompletely imaged. No evidence of mitral stenosis.  6. S/p Bentall procedure with bioprosthetic aortic valve. There is moderate calcification of the bioprosthetic aortic valve. Aortic valve regurgitation is not visualized. Mean gradient only 8 mmHg but likely low flow state with EF < 20%.  7. The inferior vena cava is dilated in size with >50% respiratory variability,  suggesting right atrial pressure of 8 mmHg.  8. Aortic root/ascending aorta has been repaired/replaced. FINDINGS  Left Ventricle: Left ventricular ejection fraction, by estimation, is <20%. The left ventricle has severely decreased function. The left ventricle demonstrates global hypokinesis. The left ventricular internal cavity size was severely dilated. There is no left ventricular hypertrophy. Left ventricular diastolic parameters are indeterminate. Right Ventricle: The right ventricular size is mildly enlarged. No increase in right ventricular wall thickness. Right ventricular systolic function is moderately reduced. Tricuspid regurgitation signal is inadequate for assessing PA pressure. Left Atrium: Left atrial size was severely dilated. Right Atrium: Right atrial size was severely dilated. Pericardium: There is no evidence of pericardial effusion. Mitral Valve: The mitral valve is normal in structure. Moderate mitral valve regurgitation. No evidence of mitral valve stenosis. MV peak gradient, 88.7 mmHg. The mean mitral valve gradient is 57.0 mmHg. Tricuspid Valve: The tricuspid valve is normal in structure. Tricuspid valve regurgitation is not demonstrated. Aortic Valve: The aortic valve has been repaired/replaced. There is moderate calcification of the aortic valve. Aortic valve regurgitation is not visualized. Mild aortic stenosis is present. Aortic valve mean gradient measures 8.0 mmHg. Aortic valve peak  gradient measures 12.6 mmHg. Aortic valve area, by VTI measures 1.54 cm. Pulmonic Valve: The pulmonic valve was normal in structure. Pulmonic valve regurgitation is not visualized. Aorta: The aortic root/ascending aorta has been repaired/replaced. Venous: The inferior vena cava is dilated in size with greater than 50% respiratory variability, suggesting right atrial pressure of 8 mmHg. IAS/Shunts: No atrial level shunt detected by color flow Doppler.  LEFT VENTRICLE PLAX 2D LVIDd:         7.10 cm       Diastology LVIDs:         6.30 cm      LV e' medial:    4.78 cm/s LV PW:         1.40 cm      LV E/e' medial:  23.6 LV IVS:        0.70 cm      LV e' lateral:   7.45 cm/s LVOT diam:  1.90 cm      LV E/e' lateral: 15.2 LV SV:         39 LV SV Index:   16 LVOT Area:     2.84 cm                              3D Volume EF: LV Volumes (MOD)            3D EF:        23 % LV vol d, MOD A4C: 267.0 ml LV EDV:       307 ml LV vol s, MOD A4C: 234.0 ml LV ESV:       237 ml LV SV MOD A4C:     267.0 ml LV SV:        71 ml RIGHT VENTRICLE            IVC RV Basal diam:  4.80 cm    IVC diam: 2.70 cm RV S prime:     5.75 cm/s TAPSE (M-mode): 0.7 cm LEFT ATRIUM              Index         RIGHT ATRIUM           Index LA diam:        4.70 cm  1.92 cm/m    RA Area:     34.50 cm LA Vol (A2C):   256.0 ml 104.52 ml/m  RA Volume:   134.00 ml 54.71 ml/m LA Vol (A4C):   232.0 ml 94.72 ml/m LA Biplane Vol: 262.0 ml 106.97 ml/m  AORTIC VALVE                     PULMONIC VALVE AV Area (Vmax):    1.68 cm      PV Vmax:       0.60 m/s AV Area (Vmean):   1.60 cm      PV Peak grad:  1.4 mmHg AV Area (VTI):     1.54 cm AV Vmax:           177.80 cm/s AV Vmean:          118.600 cm/s AV VTI:            0.254 m AV Peak Grad:      12.6 mmHg AV Mean Grad:      8.0 mmHg LVOT Vmax:         105.32 cm/s LVOT Vmean:        66.900 cm/s LVOT VTI:          0.138 m LVOT/AV VTI ratio: 0.54  AORTA Ao Root diam: 3.60 cm Ao Asc diam:  3.50 cm MITRAL VALVE MV Area (PHT): 7.90 cm     SHUNTS MV Area VTI:   0.29 cm     Systemic VTI:  0.14 m MV Peak grad:  88.7 mmHg    Systemic Diam: 1.90 cm MV Mean grad:  57.0 mmHg MV Vmax:       4.71 m/s MV Vmean:      358.0 cm/s MV Decel Time: 96 msec MV E velocity: 113.00 cm/s Dalton McleanMD Electronically signed by Wilfred Lacy Signature Date/Time: 03/12/2023/10:07:00 AM    Final    DG Chest Port 1 View  Result Date: 03/12/2023 CLINICAL DATA:  Sudden onset respiratory distress, initial encounter EXAM: PORTABLE CHEST  1 VIEW COMPARISON:  11/28/2022 FINDINGS: Cardiac shadow is enlarged.  Central vascular congestion is noted with interstitial edema consistent with CHF. Postsurgical changes are noted. No bony abnormality is seen. IMPRESSION: Changes of CHF new from the prior exam. Electronically Signed   By: Alcide Clever M.D.   On: 03/12/2023 00:36   CT ANGIO HEAD NECK W WO CM W PERF (CODE STROKE)  Result Date: 03/11/2023 CLINICAL DATA:  Neuro deficit, acute, stroke suspected. Right-sided weakness. EXAM: CT ANGIOGRAPHY HEAD AND NECK CT PERFUSION BRAIN TECHNIQUE: Multidetector CT imaging of the head and neck was performed using the standard protocol during bolus administration of intravenous contrast. Multiplanar CT image reconstructions and MIPs were obtained to evaluate the vascular anatomy. Carotid stenosis measurements (when applicable) are obtained utilizing NASCET criteria, using the distal internal carotid diameter as the denominator. Multiphase CT imaging of the brain was performed following IV bolus contrast injection. Subsequent parametric perfusion maps were calculated using RAPID software. RADIATION DOSE REDUCTION: This exam was performed according to the departmental dose-optimization program which includes automated exposure control, adjustment of the mA and/or kV according to patient size and/or use of iterative reconstruction technique. CONTRAST:  OMNIPAQUE IOHEXOL 350 MG/ML SOLN COMPARISON:  MRA head and CTA neck 12/24/2016 FINDINGS: CTA NECK FINDINGS Aortic arch: Normal variant aortic arch branching pattern with common origin of the brachiocephalic and left common carotid arteries. Mild atherosclerosis without a significant stenosis of the arch vessel origins. Right carotid system: Patent with a small amount of calcified plaque at the carotid bifurcation. No evidence of a significant stenosis or dissection. Left carotid system: Patent with minimal calcified and soft plaque at the carotid bifurcation. No  evidence of a significant stenosis or dissection. Vertebral arteries: Patent without evidence of a significant stenosis or dissection. Strongly dominant right vertebral artery which does not enter the transverse foramina until the C4 level. Skeleton: Moderate cervical spondylosis. Other neck: No evidence of cervical lymphadenopathy or mass. Upper chest: Clear lung apices. Review of the MIP images confirms the above findings CTA HEAD FINDINGS Anterior circulation: The internal carotid arteries are patent from skull base to carotid termini with mild atherosclerosis bilaterally not resulting in significant stenosis. ACAs and MCAs are patent without evidence of a proximal branch occlusion or significant proximal stenosis. No aneurysm is identified. Posterior circulation: The intracranial right vertebral artery is widely patent and supplies the basilar. The left vertebral artery is hypoplastic and functionally terminates in PICA. Patent PICA and SCA origins are visualized bilaterally. The basilar artery is patent and congenitally small without a significant focal stenosis. There are large posterior communicating arteries and hypoplastic P1 segments bilaterally. Both PCAs are patent without evidence of a significant proximal stenosis. No aneurysm is identified. Venous sinuses: As permitted by contrast timing, patent. Anatomic variants: Fetal origin of the PCAs. Hypoplastic left vertebral artery. Review of the MIP images confirms the above findings CT Brain Perfusion Findings: ASPECTS: 10 CBF (<30%) Volume: 0 mL Perfusion (Tmax>6.0s) volume: 76 mL, scattered in a patchy and largely symmetric fashion throughout both cerebral hemispheres and in the posterior fossa and favored to be artifactual. Mismatch Volume: 76 mL and likely artifactual as noted above Infarction Location: No acute core infarct identified by CTP. Findings discussed with Dr. Amada Jupiter by telephone on 03/11/2023 at 2:20 p.m. IMPRESSION: 1. Mild  atherosclerosis in the head and neck without a large vessel occlusion or significant proximal stenosis. 2. No evidence of an acute core infarct by CTP. Likely artifactual mismatch volume. 3.  Aortic Atherosclerosis (ICD10-I70.0). Electronically Signed   By: Jolaine Click.D.  On: 03/11/2023 14:44   CT HEAD CODE STROKE WO CONTRAST  Result Date: 03/11/2023 CLINICAL DATA:  Code stroke. Neuro deficit, acute, stroke suspected. Right-sided weakness. EXAM: CT HEAD WITHOUT CONTRAST TECHNIQUE: Contiguous axial images were obtained from the base of the skull through the vertex without intravenous contrast. RADIATION DOSE REDUCTION: This exam was performed according to the departmental dose-optimization program which includes automated exposure control, adjustment of the mA and/or kV according to patient size and/or use of iterative reconstruction technique. COMPARISON:  Head CT 01/24/2017 and MRI 12/24/2016 FINDINGS: Brain: A chronic infarct is again seen involving the posterior left frontal lobe and insula with mild ex vacuo dilatation of the left lateral ventricle. A small cortical infarct posteriorly in the right cerebral hemisphere at the junction of the temporal, occipital, and parietal lobes and a small cortical infarct in the posterior right frontal lobe are both new but chronic in appearance. There is also a new small chronic right cerebellar infarct. No definite acute infarct, intracranial hemorrhage, mass, midline shift, or extra-axial fluid collection is identified. There is mild cerebral atrophy. Vascular: Calcified atherosclerosis at the skull base. No hyperdense vessel. Skull: No acute fracture or suspicious osseous lesion. Sinuses/Orbits: Paranasal sinuses and mastoid air cells are clear. Unremarkable orbits. Other: None. ASPECTS ([place] Stroke Program Early CT Score) - Ganglionic level infarction (caudate, lentiform nuclei, internal capsule, insula, M1-M3 cortex): 7 - Supraganglionic infarction (M4-M6  cortex): 3 Total score (0-10 with 10 being normal): 10 These results were communicated to Dr. Amada Jupiter at 2:10 pm on 03/11/2023 by text page via the [facility] messaging system. IMPRESSION: 1. No evidence of acute intracranial abnormality. ASPECTS of 10. 2. Multiple chronic infarcts as above. ASPECTS is Electronically Signed   By: Sebastian Ache M.D.   On: 03/11/2023 14:10        Scheduled Meds:   stroke: early stages of recovery book   Does not apply Once   aspirin  81 mg Oral Daily   enoxaparin (LOVENOX) injection  40 mg Subcutaneous Q24H   furosemide  40 mg Intravenous BID   metoprolol succinate  50 mg Oral Daily   potassium chloride  40 mEq Oral BID   rosuvastatin  20 mg Oral Daily   sodium chloride flush  3 mL Intravenous Once   Continuous Infusions:   LOS: 1 day      Huey Bienenstock, MD [facility]   To contact the attending provider between 7A-7P or the covering provider during after hours 7P-7A, please log into the web site [email or web address] and access using universal [place] password for that web site. If you do not have the password, please call the hospital operator.  03/12/2023, 3:58 PM

## 2023-03-12 NOTE — H&P (Deleted)
PROGRESS NOTE    Caleb [name]  VHQ:[id number] DOB: August 07, 1960 DOA: 03/11/2023 PCP: Kallie Locks, FNP    Chief Complaint  Patient presents with   Dysphagia    Brief Narrative:   Caleb [name] is a 62 y.o. male with medical history significant for heart failure with reduced EF 25-30%, hypertension, atrial fibrillation being admitted to the hospital with concern for acute CVA.  Patient states that the only medication he has been taking routinely is Lasix, he probably took Eliquis about a month ago.  Says that he has not been taking his metoprolol because it is " too much."  And has not been taking Eliquis due to the cost.  He lives by himself.  He woke up about 8 AM today, feeling normal.  Soon after that, he noticed to have was having some speech difficulty and he had some right sided numbness in his arm.  This resolved after about 5 minutes.  However his speech difficulty continued.  Denies any recent illness, fevers, chills, falls, difficulty walking.    Assessment & Plan:   Principal Problem:   Acute CVA (cerebrovascular accident) [facility]) Active Problems:   Permanent atrial fibrillation (HCC)   Acute on chronic combined systolic and diastolic CHF (congestive heart failure) (HCC)   NICM (nonischemic cardiomyopathy) (HCC)  Possible acute ischemic CVA  -Suspected acute ischemic stroke, as he presents with sudden dysarthria, right arm numbness, most likely in the setting of A-fib with noncompliance of medications and anticoagulation -Continue with aspirin,, decision about Eliquis pending MRI findings and neurology recommendation -PT/OT/SLP consult pending -MRI brain has been done, await official reading -Please see discussion below regarding echo findings   Persistent atrial fibrillation  History of left atrial clip  -With known history of atrial fibrillation, noncompliant with medication or anticoagulation  -Known low EF, so continue Toprol-XL 50 mg for heart rate  control -Management per cardiology no Amio unless heart rate is uncontrolled on walking agents -Awaiting MRI results before decision on resuming Eliquis.   Chronic HFrEF  Nonischemic cardiomyopathy -2D echo in the past significant for EF 25 to 30%, significantly low this admission at<20% -Management per cardiology -GDMT  once appropriate and outside of permissive hypertensive with now   Status post Bentall and LAA clipping 2019/ Bicuspid aortic valve associated with moderate aortic regurgitation and thoracic aortic aneurysm  Needs to be on lifelong aspirin and likely Eliquis too given recurrent CVA.Will also need dental prophylaxis.     HLD LDL is 81, continue with statin   AKI Baseline was around 140 years ago, it is 1.4 on admission  Hypertension -Allow for permissive hypertension  Medication noncompliance -TOC consulted, to help arrange for meds as an outpatient.   DVT prophylaxis: Lovenox Code Status: Full Family Communication: None at bedside Disposition:   Status is: Inpatient    Consultants:  Cardiology Neurology  Subjective:  Patient is somnolent, denies any chest pain, shortness of breath  Objective: Vitals:   03/11/23 2349 03/12/23 0000 03/12/23 0800 03/12/23 1222  BP:   (!) 128/98 (!) 116/96  Pulse:    (!) 104  Resp:   20 (!) 24  Temp: 97.7 F (36.5 C) 98.8 F (37.1 C)  (!) 97.1 F (36.2 C)  TempSrc:  Oral  Axillary  SpO2:    96%  Weight:      Height:        Intake/Output Summary (Last 24 hours) at 03/12/2023 1327 Last data filed at 03/12/2023 1252 Gross per 24 hour  Intake --  Output 200 ml  Net -200 ml   Filed Weights   03/11/23 1949  Weight: 117.5 kg    Examination:  Patient is somnolent as he received Ativan in preparation for MRI, but he wakes up and answer questions and go back to sleep. Symmetrical Chest wall movement, Good air movement bilaterally, CTAB IRREGULAR, No Gallops, No Parasternal Heave +ve B.Sounds, Abd Soft, No  tenderness, No rebound - guarding or rigidity. No Cyanosis, Clubbing or edema, No new Rash or bruise      Data Reviewed: I have personally reviewed following labs and imaging studies  CBC: Recent Labs  Lab 03/11/23 1346 03/11/23 1353  WBC 7.3  --   NEUTROABS 5.1  --   HGB 14.4 15.3  HCT 44.8 45.0  MCV 93.7  --   PLT 205  --     Basic Metabolic Panel: Recent Labs  Lab 03/11/23 1346 03/11/23 1353 03/12/23 0339  NA 138 141 137  K 3.3* 3.4* 3.2*  CL 105 104 101  CO2 27  --  24  GLUCOSE 89 89 95  BUN 18 17 16   CREATININE 1.32* 1.40* 1.38*  CALCIUM 8.7*  --  8.6*  MG  --   --  1.9    GFR: Estimated Creatinine Clearance: 75.7 mL/min (A) (by C-G formula based on SCr of 1.38 mg/dL (H)).  Liver Function Tests: Recent Labs  Lab 03/11/23 1346  AST 31  ALT 25  ALKPHOS 44  BILITOT 1.8*  PROT 7.0  ALBUMIN 3.7    CBG: Recent Labs  Lab 03/11/23 1344  GLUCAP 123*     No results found for this or any previous visit (from the past 240 hour(s)).       Radiology Studies: ECHOCARDIOGRAM COMPLETE  Result Date: 03/12/2023    ECHOCARDIOGRAM REPORT   Patient Name:   Caleb [name] Date of Exam: 03/12/2023 Medical Rec #:  [id number]     Height:       75.0 in Accession #:    [id number]    Weight:       259.0 lb Date of Birth:  01/24/1961      BSA:          2.449 m Patient Age:    62 years      BP:           119/50 mmHg Patient Gender: M             HR:           110 bpm. Exam Location:  Inpatient Procedure: 2D Echo, Cardiac Doppler, Color Doppler, 3D Echo, Intracardiac            Opacification Agent and Strain Analysis Indications:    Stroke I63.9  History:        Patient has prior history of Echocardiogram examinations, most                 recent 01/17/2018. CHF and Cardiomyopathy, Stroke,                 Arrythmias:Atrial Fibrillation; Risk Factors:Non-Smoker.  Sonographer:    Dondra Prader RVT RCS Referring Phys: [facility], MIR, M IMPRESSIONS  1. Left ventricular ejection  fraction, by estimation, is <20%. The left ventricle has severely decreased function. The left ventricle demonstrates global hypokinesis. The left ventricular internal cavity size was severely dilated. Left ventricular diastolic parameters are indeterminate. No LV thrombus.  2. Right ventricular systolic function is moderately reduced. The right  ventricular size is mildly enlarged. Tricuspid regurgitation signal is inadequate for assessing PA pressure.  3. Left atrial size was severely dilated.  4. Right atrial size was severely dilated.  5. The mitral valve is normal in structure. Moderate mitral valve regurgitation, posteriorly-directed and may be incompletely imaged. No evidence of mitral stenosis.  6. S/p Bentall procedure with bioprosthetic aortic valve. There is moderate calcification of the bioprosthetic aortic valve. Aortic valve regurgitation is not visualized. Mean gradient only 8 mmHg but likely low flow state with EF < 20%.  7. The inferior vena cava is dilated in size with >50% respiratory variability, suggesting right atrial pressure of 8 mmHg.  8. Aortic root/ascending aorta has been repaired/replaced. FINDINGS  Left Ventricle: Left ventricular ejection fraction, by estimation, is <20%. The left ventricle has severely decreased function. The left ventricle demonstrates global hypokinesis. The left ventricular internal cavity size was severely dilated. There is no left ventricular hypertrophy. Left ventricular diastolic parameters are indeterminate. Right Ventricle: The right ventricular size is mildly enlarged. No increase in right ventricular wall thickness. Right ventricular systolic function is moderately reduced. Tricuspid regurgitation signal is inadequate for assessing PA pressure. Left Atrium: Left atrial size was severely dilated. Right Atrium: Right atrial size was severely dilated. Pericardium: There is no evidence of pericardial effusion. Mitral Valve: The mitral valve is normal in  structure. Moderate mitral valve regurgitation. No evidence of mitral valve stenosis. MV peak gradient, 88.7 mmHg. The mean mitral valve gradient is 57.0 mmHg. Tricuspid Valve: The tricuspid valve is normal in structure. Tricuspid valve regurgitation is not demonstrated. Aortic Valve: The aortic valve has been repaired/replaced. There is moderate calcification of the aortic valve. Aortic valve regurgitation is not visualized. Mild aortic stenosis is present. Aortic valve mean gradient measures 8.0 mmHg. Aortic valve peak  gradient measures 12.6 mmHg. Aortic valve area, by VTI measures 1.54 cm. Pulmonic Valve: The pulmonic valve was normal in structure. Pulmonic valve regurgitation is not visualized. Aorta: The aortic root/ascending aorta has been repaired/replaced. Venous: The inferior vena cava is dilated in size with greater than 50% respiratory variability, suggesting right atrial pressure of 8 mmHg. IAS/Shunts: No atrial level shunt detected by color flow Doppler.  LEFT VENTRICLE PLAX 2D LVIDd:         7.10 cm      Diastology LVIDs:         6.30 cm      LV e' medial:    4.78 cm/s LV PW:         1.40 cm      LV E/e' medial:  23.6 LV IVS:        0.70 cm      LV e' lateral:   7.45 cm/s LVOT diam:     1.90 cm      LV E/e' lateral: 15.2 LV SV:         39 LV SV Index:   16 LVOT Area:     2.84 cm                              3D Volume EF: LV Volumes (MOD)            3D EF:        23 % LV vol d, MOD A4C: 267.0 ml LV EDV:       307 ml LV vol s, MOD A4C: 234.0 ml LV ESV:       237 ml  LV SV MOD A4C:     267.0 ml LV SV:        71 ml RIGHT VENTRICLE            IVC RV Basal diam:  4.80 cm    IVC diam: 2.70 cm RV S prime:     5.75 cm/s TAPSE (M-mode): 0.7 cm LEFT ATRIUM              Index         RIGHT ATRIUM           Index LA diam:        4.70 cm  1.92 cm/m    RA Area:     34.50 cm LA Vol (A2C):   256.0 ml 104.52 ml/m  RA Volume:   134.00 ml 54.71 ml/m LA Vol (A4C):   232.0 ml 94.72 ml/m LA Biplane Vol: 262.0 ml  106.97 ml/m  AORTIC VALVE                     PULMONIC VALVE AV Area (Vmax):    1.68 cm      PV Vmax:       0.60 m/s AV Area (Vmean):   1.60 cm      PV Peak grad:  1.4 mmHg AV Area (VTI):     1.54 cm AV Vmax:           177.80 cm/s AV Vmean:          118.600 cm/s AV VTI:            0.254 m AV Peak Grad:      12.6 mmHg AV Mean Grad:      8.0 mmHg LVOT Vmax:         105.32 cm/s LVOT Vmean:        66.900 cm/s LVOT VTI:          0.138 m LVOT/AV VTI ratio: 0.54  AORTA Ao Root diam: 3.60 cm Ao Asc diam:  3.50 cm MITRAL VALVE MV Area (PHT): 7.90 cm     SHUNTS MV Area VTI:   0.29 cm     Systemic VTI:  0.14 m MV Peak grad:  88.7 mmHg    Systemic Diam: 1.90 cm MV Mean grad:  57.0 mmHg MV Vmax:       4.71 m/s MV Vmean:      358.0 cm/s MV Decel Time: 96 msec MV E velocity: 113.00 cm/s Dalton McleanMD Electronically signed by Wilfred Lacy Signature Date/Time: 03/12/2023/10:07:00 AM    Final    DG Chest Port 1 View  Result Date: 03/12/2023 CLINICAL DATA:  Sudden onset respiratory distress, initial encounter EXAM: PORTABLE CHEST 1 VIEW COMPARISON:  11/28/2022 FINDINGS: Cardiac shadow is enlarged. Central vascular congestion is noted with interstitial edema consistent with CHF. Postsurgical changes are noted. No bony abnormality is seen. IMPRESSION: Changes of CHF new from the prior exam. Electronically Signed   By: Alcide Clever M.D.   On: 03/12/2023 00:36   CT ANGIO HEAD NECK W WO CM W PERF (CODE STROKE)  Result Date: 03/11/2023 CLINICAL DATA:  Neuro deficit, acute, stroke suspected. Right-sided weakness. EXAM: CT ANGIOGRAPHY HEAD AND NECK CT PERFUSION BRAIN TECHNIQUE: Multidetector CT imaging of the head and neck was performed using the standard protocol during bolus administration of intravenous contrast. Multiplanar CT image reconstructions and MIPs were obtained to evaluate the vascular anatomy. Carotid stenosis measurements (when applicable) are obtained utilizing NASCET criteria, using the distal internal  carotid diameter as the denominator. Multiphase CT imaging of the brain was performed following IV bolus contrast injection. Subsequent parametric perfusion maps were calculated using RAPID software. RADIATION DOSE REDUCTION: This exam was performed according to the departmental dose-optimization program which includes automated exposure control, adjustment of the mA and/or kV according to patient size and/or use of iterative reconstruction technique. CONTRAST:  OMNIPAQUE IOHEXOL 350 MG/ML SOLN COMPARISON:  MRA head and CTA neck 12/24/2016 FINDINGS: CTA NECK FINDINGS Aortic arch: Normal variant aortic arch branching pattern with common origin of the brachiocephalic and left common carotid arteries. Mild atherosclerosis without a significant stenosis of the arch vessel origins. Right carotid system: Patent with a small amount of calcified plaque at the carotid bifurcation. No evidence of a significant stenosis or dissection. Left carotid system: Patent with minimal calcified and soft plaque at the carotid bifurcation. No evidence of a significant stenosis or dissection. Vertebral arteries: Patent without evidence of a significant stenosis or dissection. Strongly dominant right vertebral artery which does not enter the transverse foramina until the C4 level. Skeleton: Moderate cervical spondylosis. Other neck: No evidence of cervical lymphadenopathy or mass. Upper chest: Clear lung apices. Review of the MIP images confirms the above findings CTA HEAD FINDINGS Anterior circulation: The internal carotid arteries are patent from skull base to carotid termini with mild atherosclerosis bilaterally not resulting in significant stenosis. ACAs and MCAs are patent without evidence of a proximal branch occlusion or significant proximal stenosis. No aneurysm is identified. Posterior circulation: The intracranial right vertebral artery is widely patent and supplies the basilar. The left vertebral artery is hypoplastic and  functionally terminates in PICA. Patent PICA and SCA origins are visualized bilaterally. The basilar artery is patent and congenitally small without a significant focal stenosis. There are large posterior communicating arteries and hypoplastic P1 segments bilaterally. Both PCAs are patent without evidence of a significant proximal stenosis. No aneurysm is identified. Venous sinuses: As permitted by contrast timing, patent. Anatomic variants: Fetal origin of the PCAs. Hypoplastic left vertebral artery. Review of the MIP images confirms the above findings CT Brain Perfusion Findings: ASPECTS: 10 CBF (<30%) Volume: 0 mL Perfusion (Tmax>6.0s) volume: 76 mL, scattered in a patchy and largely symmetric fashion throughout both cerebral hemispheres and in the posterior fossa and favored to be artifactual. Mismatch Volume: 76 mL and likely artifactual as noted above Infarction Location: No acute core infarct identified by CTP. Findings discussed with Dr. Amada Jupiter by telephone on 03/11/2023 at 2:20 p.m. IMPRESSION: 1. Mild atherosclerosis in the head and neck without a large vessel occlusion or significant proximal stenosis. 2. No evidence of an acute core infarct by CTP. Likely artifactual mismatch volume. 3.  Aortic Atherosclerosis (ICD10-I70.0). Electronically Signed   By: Sebastian Ache M.D.   On: 03/11/2023 14:44   CT HEAD CODE STROKE WO CONTRAST  Result Date: 03/11/2023 CLINICAL DATA:  Code stroke. Neuro deficit, acute, stroke suspected. Right-sided weakness. EXAM: CT HEAD WITHOUT CONTRAST TECHNIQUE: Contiguous axial images were obtained from the base of the skull through the vertex without intravenous contrast. RADIATION DOSE REDUCTION: This exam was performed according to the departmental dose-optimization program which includes automated exposure control, adjustment of the mA and/or kV according to patient size and/or use of iterative reconstruction technique. COMPARISON:  Head CT 01/24/2017 and MRI 12/24/2016  FINDINGS: Brain: A chronic infarct is again seen involving the posterior left frontal lobe and insula with mild ex vacuo dilatation of the left lateral ventricle. A small cortical infarct posteriorly in the  right cerebral hemisphere at the junction of the temporal, occipital, and parietal lobes and a small cortical infarct in the posterior right frontal lobe are both new but chronic in appearance. There is also a new small chronic right cerebellar infarct. No definite acute infarct, intracranial hemorrhage, mass, midline shift, or extra-axial fluid collection is identified. There is mild cerebral atrophy. Vascular: Calcified atherosclerosis at the skull base. No hyperdense vessel. Skull: No acute fracture or suspicious osseous lesion. Sinuses/Orbits: Paranasal sinuses and mastoid air cells are clear. Unremarkable orbits. Other: None. ASPECTS (Alberta Stroke Program Early CT Score) - Ganglionic level infarction (caudate, lentiform nuclei, internal capsule, insula, M1-M3 cortex): 7 - Supraganglionic infarction (M4-M6 cortex): 3 Total score (0-10 with 10 being normal): 10 These results were communicated to Dr. Amada Jupiter at 2:10 pm on 03/11/2023 by text page via the [facility] messaging system. IMPRESSION: 1. No evidence of acute intracranial abnormality. ASPECTS of 10. 2. Multiple chronic infarcts as above. ASPECTS is Electronically Signed   By: Sebastian Ache M.D.   On: 03/11/2023 14:10        Scheduled Meds:   stroke: early stages of recovery book   Does not apply Once   aspirin  81 mg Oral Daily   enoxaparin (LOVENOX) injection  40 mg Subcutaneous Q24H   furosemide  40 mg Intravenous BID   metoprolol succinate  50 mg Oral Daily   potassium chloride  40 mEq Oral BID   rosuvastatin  20 mg Oral Daily   sodium chloride flush  3 mL Intravenous Once   Continuous Infusions:   LOS: 1 day      Huey Bienenstock, MD [facility]   To contact the attending provider between 7A-7P or the covering  provider during after hours 7P-7A, please log into the web site [email or web address] and access using universal [place] password for that web site. If you do not have the password, please call the hospital operator.  03/12/2023, 1:27 PM

## 2023-03-13 ENCOUNTER — Other Ambulatory Visit (HOSPITAL_COMMUNITY): Payer: Self-pay

## 2023-03-13 ENCOUNTER — Telehealth: Payer: Self-pay | Admitting: Cardiology

## 2023-03-13 DIAGNOSIS — R4781 Slurred speech: Secondary | ICD-10-CM | POA: Insufficient documentation

## 2023-03-13 LAB — BASIC METABOLIC PANEL
Anion gap: 12 (ref 5–15)
BUN: 18 mg/dL (ref 8–23)
CO2: 24 mmol/L (ref 22–32)
Calcium: 8.6 mg/dL — ABNORMAL LOW (ref 8.9–10.3)
Chloride: 100 mmol/L (ref 98–111)
Creatinine, Ser: 1.42 mg/dL — ABNORMAL HIGH (ref 0.61–1.24)
GFR, Estimated: 56 mL/min — ABNORMAL LOW (ref >60)
Glucose, Bld: 93 mg/dL (ref 70–99)
Potassium: 3.7 mmol/L (ref 3.5–5.1)
Sodium: 136 mmol/L (ref 135–145)

## 2023-03-13 LAB — CBC
HCT: 46.2 % (ref 39.0–52.0)
Hemoglobin: 15.2 g/dL (ref 13.0–17.0)
MCH: 29.4 pg (ref 26.0–34.0)
MCHC: 32.9 g/dL (ref 30.0–36.0)
MCV: 89.4 fL (ref 80.0–100.0)
Platelets: 190 10*3/uL (ref 150–400)
RBC: 5.17 MIL/uL (ref 4.22–5.81)
RDW: 14.2 % (ref 11.5–15.5)
WBC: 5.7 10*3/uL (ref 4.0–10.5)
nRBC: 0 % (ref 0.0–0.2)

## 2023-03-13 MED ORDER — TORSEMIDE 20 MG PO TABS
20.0000 mg | ORAL_TABLET | Freq: Every day | ORAL | 0 refills | Status: DC
  Filled 2023-03-13: qty 30, 30d supply, fill #0

## 2023-03-13 MED ORDER — ASPIRIN 81 MG PO CHEW
81.0000 mg | CHEWABLE_TABLET | Freq: Every day | ORAL | 0 refills | Status: DC
  Filled 2023-03-13: qty 30, 30d supply, fill #0

## 2023-03-13 MED ORDER — TORSEMIDE 20 MG PO TABS
20.0000 mg | ORAL_TABLET | Freq: Every day | ORAL | Status: DC
  Administered 2023-03-13: 20 mg via ORAL
  Filled 2023-03-13: qty 1

## 2023-03-13 MED ORDER — ROSUVASTATIN CALCIUM 20 MG PO TABS
20.0000 mg | ORAL_TABLET | Freq: Every day | ORAL | 0 refills | Status: DC
  Filled 2023-03-13: qty 30, 30d supply, fill #0

## 2023-03-13 MED ORDER — EMPAGLIFLOZIN 10 MG PO TABS
10.0000 mg | ORAL_TABLET | Freq: Every day | ORAL | 0 refills | Status: DC
  Filled 2023-03-13: qty 30, 30d supply, fill #0

## 2023-03-13 MED ORDER — APIXABAN 5 MG PO TABS
5.0000 mg | ORAL_TABLET | Freq: Two times a day (BID) | ORAL | 0 refills | Status: DC
  Filled 2023-03-13: qty 60, 30d supply, fill #0

## 2023-03-13 MED ORDER — METOPROLOL SUCCINATE ER 50 MG PO TB24
50.0000 mg | ORAL_TABLET | Freq: Every day | ORAL | 0 refills | Status: DC
  Filled 2023-03-13: qty 30, 30d supply, fill #0

## 2023-03-13 MED ORDER — POTASSIUM CHLORIDE CRYS ER 20 MEQ PO TBCR
20.0000 meq | EXTENDED_RELEASE_TABLET | Freq: Every day | ORAL | 0 refills | Status: DC
  Filled 2023-03-13: qty 30, 30d supply, fill #0

## 2023-03-13 MED ORDER — RIVAROXABAN 20 MG PO TABS
20.0000 mg | ORAL_TABLET | Freq: Every day | ORAL | 0 refills | Status: DC
  Filled 2023-03-13: qty 30, 30d supply, fill #0

## 2023-03-13 MED ORDER — EMPAGLIFLOZIN 10 MG PO TABS
10.0000 mg | ORAL_TABLET | Freq: Every day | ORAL | Status: DC
  Administered 2023-03-13: 10 mg via ORAL
  Filled 2023-03-13: qty 1

## 2023-03-13 NOTE — Plan of Care (Signed)
Pt has rested quietly throughout the night with no distress noted. Alert and oriented. On 2LNC. Afib on monitor. Voids per urinal. Turns self in bed. Medicated for sleep with relief noted. No complaints voiced.     Problem: Education: Goal: Knowledge of disease or condition will improve Outcome: Progressing Goal: Knowledge of secondary prevention will improve (MUST DOCUMENT ALL) Outcome: Progressing Goal: Knowledge of patient specific risk factors will improve Loraine Leriche N/A or DELETE if not current risk factor) Outcome: Progressing   Problem: Ischemic Stroke/TIA Tissue Perfusion: Goal: Complications of ischemic stroke/TIA will be minimized Outcome: Progressing   Problem: Coping: Goal: Will verbalize positive feelings about self Outcome: Progressing Goal: Will identify appropriate support needs Outcome: Progressing   Problem: Health Behavior/Discharge Planning: Goal: Ability to manage health-related needs will improve Outcome: Progressing Goal: Goals will be collaboratively established with patient/family Outcome: Progressing   Problem: Self-Care: Goal: Ability to participate in self-care as condition permits will improve Outcome: Progressing Goal: Verbalization of feelings and concerns over difficulty with self-care will improve Outcome: Progressing Goal: Ability to communicate needs accurately will improve Outcome: Progressing   Problem: Nutrition: Goal: Risk of aspiration will decrease Outcome: Progressing Goal: Dietary intake will improve Outcome: Progressing   Problem: Education: Goal: Knowledge of General Education information will improve Description: Including pain rating scale, medication(s)/side effects and non-pharmacologic comfort measures Outcome: Progressing   Problem: Health Behavior/Discharge Planning: Goal: Ability to manage health-related needs will improve Outcome: Progressing   Problem: Clinical Measurements: Goal: Ability to maintain clinical  measurements within normal limits will improve Outcome: Progressing Goal: Will remain free from infection Outcome: Progressing Goal: Diagnostic test results will improve Outcome: Progressing Goal: Respiratory complications will improve Outcome: Progressing Goal: Cardiovascular complication will be avoided Outcome: Progressing   Problem: Activity: Goal: Risk for activity intolerance will decrease Outcome: Progressing   Problem: Nutrition: Goal: Adequate nutrition will be maintained Outcome: Progressing   Problem: Coping: Goal: Level of anxiety will decrease Outcome: Progressing   Problem: Elimination: Goal: Will not experience complications related to bowel motility Outcome: Progressing Goal: Will not experience complications related to urinary retention Outcome: Progressing   Problem: Pain Managment: Goal: General experience of comfort will improve Outcome: Progressing   Problem: Safety: Goal: Ability to remain free from injury will improve Outcome: Progressing   Problem: Skin Integrity: Goal: Risk for impaired skin integrity will decrease Outcome: Progressing

## 2023-03-13 NOTE — Progress Notes (Signed)
Heart Failure Nurse Navigator Progress Note    Patient admitted with Acute CVA, EF <20%. BNP 794.  Attempted to interview patient for HF TOC, patient very agitated about being discharged and missing his [employer] ride. Navigator made patient a HF TOC follow up appointment for 03/30/2023 @ 2 pm. A "Living Better with Heart Failure book" was given to patient, however no education or SDOH was completed, due to patient walked out of the room to leave.    Rhae Hammock, BSN, Scientist, clinical (histocompatibility and immunogenetics) Only

## 2023-03-13 NOTE — TOC Progression Note (Signed)
Transition of Care [facility]) - Progression Note    Patient Details  Name: Caleb [name] MRN: [id number] Date of Birth: 1960-07-09  [facility]) CM/SW Contact  Lockie Pares, RN Phone Number: 03/13/2023, 12:19 PM  Clinical Narrative:     Patient has stated he had Medicaid in [place]. Message sent to [facility]  to look this up and verify and to assist patient in converting this to [facility] Medicaid. Patient has EF <20%. Has not been taking medications due to cost. ( Eliquis, antihypertensives) positive for amphetamines and THC. MATCH done yesterday by [facility] Medications need to be sent to [facility] pharmacy       Expected Discharge Plan and Services                                               Social Determinants of Health (SDOH) Interventions SDOH Screenings   Food Insecurity: No Food Insecurity (03/12/2023)  Housing: Low Risk  (03/12/2023)  Transportation Needs: No Transportation Needs (03/12/2023)  Utilities: Not At Risk (03/12/2023)  Tobacco Use: Low Risk  (03/11/2023)    Readmission Risk Interventions     No data to display

## 2023-03-13 NOTE — TOC Transition Note (Signed)
Transition of Care [facility]) - CM/SW Discharge Note   Patient Details  Name: ZAIYDEN STROZIER MRN: [id number] Date of Birth: February 20, 1961  [facility]) CM/SW Contact:  Lockie Pares, RN Phone Number: 03/13/2023, 2:01 PM   Clinical Narrative:    Called patient to discuss discharge planning. No answer at patient room phone, left confidential voice message to call this RNCM back. Reached out to nursing. MATCH done yesterday to pharmacy , sent to [facility].pharmacy reached out to charity [facility]) for PT RN ( disease management) for home health.  Unsure if they will accept.   Patient will be DC today   Final next level of care: Home/Self Care     Patient Goals and CMS Choice      Discharge Placement                         Discharge Plan and Services Additional resources added to the After Visit Summary for                                       Social Determinants of Health (SDOH) Interventions SDOH Screenings   Food Insecurity: No Food Insecurity (03/12/2023)  Housing: Low Risk  (03/12/2023)  Transportation Needs: No Transportation Needs (03/12/2023)  Utilities: Not At Risk (03/12/2023)  Tobacco Use: Low Risk  (03/11/2023)     Readmission Risk Interventions     No data to display

## 2023-03-13 NOTE — Discharge Instructions (Signed)
Follow with Primary MD Kallie Locks, FNP in 7 days   Get CBC, CMP,checked  by Primary MD next visit.    Activity: As tolerated with Full fall precautions use walker/cane & assistance as needed   Disposition Home   Diet: Heart Healthy   For Heart failure patients - Check your Weight same time everyday, if you gain over 2 pounds, or you develop in leg swelling, experience more shortness of breath or chest pain, call your Primary MD immediately. Follow Cardiac Low Salt Diet and 1.5 lit/day fluid restriction.   On your next visit with your primary care physician please Get Medicines reviewed and adjusted.   Please request your Prim.MD to go over all Hospital Tests and Procedure/Radiological results at the follow up, please get all Hospital records sent to your Prim MD by signing hospital release before you go home.   If you experience worsening of your admission symptoms, develop shortness of breath, life threatening emergency, suicidal or homicidal thoughts you must seek medical attention immediately by calling 911 or calling your MD immediately  if symptoms less severe.  You Must read complete instructions/literature along with all the possible adverse reactions/side effects for all the Medicines you take and that have been prescribed to you. Take any new Medicines after you have completely understood and accpet all the possible adverse reactions/side effects.   Do not drive, operating heavy machinery, perform activities at heights, swimming or participation in water activities or provide baby sitting services if your were admitted for syncope or siezures until you have seen by Primary MD or a Neurologist and advised to do so again.  Do not drive when taking Pain medications.    Do not take more than prescribed Pain, Sleep and Anxiety Medications  Special Instructions: If you have smoked or chewed Tobacco  in the last 2 yrs please stop smoking, stop any regular Alcohol  and or any  Recreational drug use.  Wear Seat belts while driving.   Please note  You were cared for by a hospitalist during your hospital stay. If you have any questions about your discharge medications or the care you received while you were in the hospital after you are discharged, you can call the unit and asked to speak with the hospitalist on call if the hospitalist that took care of you is not available. Once you are discharged, your primary care physician will handle any further medical issues. Please note that NO REFILLS for any discharge medications will be authorized once you are discharged, as it is imperative that you return to your primary care physician (or establish a relationship with a primary care physician if you do not have one) for your aftercare needs so that they can reassess your need for medications and monitor your lab values.

## 2023-03-13 NOTE — Evaluation (Signed)
Physical Therapy Evaluation Patient Details Name: Caleb [name] MRN: [id number] DOB: March 06, 1961 Today's Date: 03/13/2023  History of Present Illness  Patient is a 62 year old male presenting after difficulty speaking and numbness in the right arm. MRI of brain limited but reports no acute intracranial abnormality. History of hypertension, CHF, previous stroke  Clinical Impression  Patient is agreeable to PT evaluation with encouragement. He reports he is independent at baseline, lives alone in a second floor apartment. He drives and goes to the gym at baseline for weight lifting.  No focal weakness was noted with exam today. Patient requires supervision with periods of contact guard assistance with hallway ambulation with occasional drifting to the left with fatigue. Patient reports extreme fatigue after mobility with stable vitals and no significant dyspnea with exertion. Recommend to continue PT to maximize independence and decrease caregiver burden. Consider PT follow up after this hospital stay if symptoms persist.       If plan is discharge home, recommend the following: Assist for transportation;Assistance with cooking/housework;Help with stairs or ramp for entrance   Can travel by private vehicle        Equipment Recommendations None recommended by PT  Recommendations for Other Services       Functional Status Assessment Patient has had a recent decline in their functional status and demonstrates the ability to make significant improvements in function in a reasonable and predictable amount of time.     Precautions / Restrictions Precautions Precautions: Fall Restrictions Weight Bearing Restrictions: No      Mobility  Bed Mobility Overal bed mobility: Modified Independent                  Transfers Overall transfer level: Needs assistance Equipment used: None Transfers: Sit to/from Stand Sit to Stand: Supervision                 Ambulation/Gait Ambulation/Gait assistance: Supervision, Contact guard assist Gait Distance (Feet): 100 Feet Assistive device: None Gait Pattern/deviations: Step-through pattern, Drifts right/left Gait velocity: decreased     General Gait Details: supervision with periods of CGA for safety. occasional drift to the left with fatigue. patient reports feeling extreme fatigue with walking with no significant change in vital signs and no significant dyspnea with exertion  Stairs            Wheelchair Mobility     Tilt Bed    Modified Rankin (Stroke Patients Only)       Balance Overall balance assessment: Needs assistance   Sitting balance-Leahy Scale: Good     Standing balance support: No upper extremity supported Standing balance-Leahy Scale: Fair                               Pertinent Vitals/Pain Pain Assessment Pain Assessment: No/denies pain    Home Living Family/patient expects to be discharged to:: Private residence Living Arrangements: Alone Available Help at Discharge: Friend(s);Available PRN/intermittently Type of Home: Apartment Home Access: Stairs to enter Entrance Stairs-Rails: Right;Left Entrance Stairs-Number of Steps: 2 flights   Home Layout: One level Home Equipment: None      Prior Function Prior Level of Function : Independent/Modified Independent;Driving             Mobility Comments: patient reports he goes to the gym 4 days a week to lift weights. independent with mobility       Extremity/Trunk Assessment   Upper Extremity Assessment Upper Extremity Assessment:  Overall [facility] for tasks assessed    Lower Extremity Assessment Lower Extremity Assessment: Overall WFL for tasks assessed       Communication   Communication Communication: Difficulty communicating thoughts/reduced clarity of speech (intermittent difficulty word finding)  Cognition Arousal: Lethargic Behavior During Therapy: Flat affect Overall  Cognitive Status: Within Functional Limits for tasks assessed                                          General Comments      Exercises     Assessment/Plan    PT Assessment Patient needs continued PT services  PT Problem List Decreased activity tolerance;Decreased balance;Decreased mobility       PT Treatment Interventions DME instruction;Stair training;Gait training;Therapeutic activities;Functional mobility training;Therapeutic exercise;Balance training;Neuromuscular re-education;Patient/family education    PT Goals (Current goals can be found in the Care Plan section)  Acute Rehab PT Goals Patient Stated Goal: to have more energy PT Goal Formulation: With patient Time For Goal Achievement: 03/20/23 Potential to Achieve Goals: Fair    Frequency Min 1X/week     Co-evaluation               AM-PAC PT "6 Clicks" Mobility  Outcome Measure Help needed turning from your back to your side while in a flat bed without using bedrails?: None Help needed moving from lying on your back to sitting on the side of a flat bed without using bedrails?: None Help needed moving to and from a bed to a chair (including a wheelchair)?: None Help needed standing up from a chair using your arms (e.g., wheelchair or bedside chair)?: None Help needed to walk in hospital room?: A Little Help needed climbing 3-5 steps with a railing? : A Little 6 Click Score: 22    End of Session Equipment Utilized During Treatment: Gait belt Activity Tolerance: Patient tolerated treatment well;Patient limited by fatigue Patient left: in bed;with call bell/phone within reach;with bed alarm set Nurse Communication: Mobility status PT Visit Diagnosis: Muscle weakness (generalized) (M62.81);Unsteadiness on feet (R26.81)    Time: 7829-5621 PT Time Calculation (min) (ACUTE ONLY): 17 min   Charges:   PT Evaluation $PT Eval Low Complexity: 1 Low   PT General Charges $$ ACUTE PT VISIT: 1  Visit         [name], PT, MPT   [name] 03/13/2023, 1:01 PM

## 2023-03-13 NOTE — Progress Notes (Signed)
PT Cancellation Note  Patient Details Name: Caleb [name] MRN: [id number] DOB: 07-Oct-1960   Cancelled Treatment:    Reason Eval/Treat Not Completed: Fatigue/lethargy limiting ability to participate (Patient reports feeling better but still with extreme fatigue reported. He request PT return in the afternoon. Will continue with attempts)  [name], PT, MPT  [name] 03/13/2023, 9:54 AM
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
## 2023-03-13 NOTE — Telephone Encounter (Signed)
   Transition of Care Follow-up Phone Call Request    Patient Name: TARENCE SEARCY Date of Birth: 18-Sep-1960 Date of Encounter: 03/13/2023  Primary Care Provider:  Kallie Locks, FNP Primary Cardiologist:  Thurmon Fair, MD  Davonna Belling has been scheduled for a transition of care follow up appointment with a [facility] provider:     Transition of Care Follow-up Phone Call Request    Patient Name: DONTRAE MORINI Date of Birth: 1960/09/04 Date of Encounter: 03/13/2023  Primary Care Provider:  Kallie Locks, FNP Primary Cardiologist:  Thurmon Fair, MD  Davonna Belling has been scheduled for a transition of care follow up appointment with a [facility] provider:  Edd Fabian 10/17. Check labs at upcoming appointment.   Please reach out to Davonna Belling within 48 hours of discharge to confirm appointment and review transition of care protocol questionnaire. Anticipated discharge date: today  Abagail Kitchens, Cordelia Poche  03/13/2023, 1:59 PM     Please reach out to Davonna Belling within 48 hours of discharge to confirm appointment and review transition of care protocol questionnaire. Anticipated discharge date: today  Abagail Kitchens, Cordelia Poche  03/13/2023, 1:58 PM

## 2023-03-13 NOTE — Telephone Encounter (Signed)
Left voicemail to return call to office.

## 2023-03-13 NOTE — Progress Notes (Signed)
   Heart Failure Stewardship Pharmacist Progress Note   PCP: Kallie Locks, FNP PCP-Cardiologist: Thurmon Fair, MD    HPI:  62 yo M with PMH of CHF, HTN, afib, and prior CVA.  Admitted 12/2016 with acute CVA in bilateral frontal and left parietal lobes. EF 20%. Prior to this, he was receiving care in [place]. Unsure if this was a new diagnosis for him. EF improved some to 30-35% in 03/2017 after starting GDMT.   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   EF has remained 20-30% with inconsistency taking medications.   He reports he was seeing a cardiologist in [place] again but has been without most medications for a month or so since his Medicaid [place] out of state.  Presented to the ED on 10/2 with aphasia and right sided weakness. CT head without acute intracranial abnormalities. No LVO on CTA. Outside of the window for tPA. MRI brain without acute intracranial abnormalities. CXR with central vascular congestion and interstitial edema. BNP elevated. ECHO 10/3 showed LVEF <20%, RV moderately reduced, moderate MR, s/p bentall procedure with bioprosthetic aortic valve.   Current HF Medications: Diuretic: torsemide 20 mg daily Beta Blocker: metoprolol XL 50 mg daily SGLT2i: Jardiance 10 mg daily  Prior to admission HF Medications: Diuretic: furosemide 40 mg BID *not taking metoprolol or digoxin  Pertinent Lab Values: Serum creatinine 1.42, BUN 18, Potassium 3.7, Sodium 136, BNP 794.9, Magnesium 1.9, A1c 5.6   Vital Signs: Weight: 241 lbs (admission weight: 259 lbs) Blood pressure: 100/80s  Heart rate: 90-110s  I/O: net -3.2L yesterday; net -3.7L since admission  Medication Assistance / Insurance Benefits Check: Does the patient have prescription insurance?  Yes Type of insurance plan: reports he has out of state Medicaid  Outpatient Pharmacy:  Prior to admission [facility] Is the patient willing to use [facility] TOC  pharmacy at discharge? Yes Is the patient willing to transition their outpatient pharmacy to utilize a [facility] outpatient pharmacy?   Pending    Assessment: 1. Acute on chronic systolic CHF (LVEF <20%), due to NICM. NYHA class IV symptoms. - Off IV lasix, volume improved. Strict I/Os and daily weights. Keep K>4 and Mg>2. - Continue metoprolol XL 50 mg daily - Consider starting Entresto 24/26 mg BID and spironolactone as outpatient pending renal function. BP soft today.  - Agree with starting Jardiance 10 mg daily   Plan: 1) Medication changes recommended at this time: - Agree with changes - Consider starting Entresto and spironolactone at follow up  2) Patient assistance: - Has out of state Medicaid - Started patient assistance application for Jardiance and Eliquis to see if they will help cover medication since he does not have any active insurance in [place]. Patient became agitated and left the unit without signing assistance applications. Will scan to his chart to follow up on either at [facility] follow up or with HF TOC.  3)  Education  - Patient has been educated on current HF medications and potential additions to HF medication regimen - Patient verbalizes understanding that over the next few months, these medication doses may change and more medications may be added to optimize HF regimen - Patient has been educated on basic disease state pathophysiology and goals of therapy   Sharen Hones, PharmD, BCPS Heart Failure Stewardship Pharmacist Phone [phone]

## 2023-03-13 NOTE — Discharge Summary (Signed)
Physician Discharge Summary  Caleb [name]:[id number] DOB: [birth year] DOA: 03/11/2023  PCP: Kallie Locks, FNP  Admit date: 03/11/2023 Discharge date: 03/13/2023  Admitted From: (Home) Disposition:  (Home)  Recommendations for Outpatient Follow-up:  Follow up with PCP in 1-2 weeks Please obtain BMP/CBC in one week Please follow up with cardiology Please continue counseling about substance abuse and medication compliance  Diet recommendation: Heart Healthy  Brief/Interim Summary:  Caleb [name] is a 62 y.o. male with medical history significant for heart failure with reduced EF 25-30%, hypertension, atrial fibrillation being admitted to the hospital with concern for acute CVA.  Patient states that the only medication he has been taking routinely is Lasix, he probably took Eliquis about a month ago.  Says that he has not been taking his metoprolol because it is " too much."  And has not been taking Eliquis due to the cost.  He lives by himself.  He woke up about 8 AM , feeling normal.  Soon after that, he noticed to have was having some speech difficulty and he had some right sided numbness in his arm.  This resolved after about 5 minutes.  However his speech difficulty continued.  Denies any recent illness, fevers, chills, falls, difficulty walking.  Patient was admitted to the hospital for further workup, he was noted to have THC vape during hospital stay which was removed by staff, his MRI came back negative for acute CVA, but workup significant for EF less than 20%, please see discussion below.  Speech difficulty - TIA vs. Related to SOB from heart failure -Neurology input greatly appreciated, CTA head and neck with no LVO, no acute core infarct by CTP, MRI brain with no acute infarct, Patient denies right-sided weakness as reported in the H&P, well his UDS has been positive for amphetamine and THC, possibly contributing to his presentation as well, as well he is noncompliant with his  anticoagulation due to financial issues were he has not been taking his Xarelto as well. - 2D Echo: LVEF is less than 20%, global hypokinesis, no LV thrombus -LDL 81 -HgbA1c 5.6 -UDS + amphetamine and THC    Persistent atrial fibrillation  History of left atrial clip  -With known history of atrial fibrillation, noncompliant with medication or anticoagulation, cardiology input greatly appreciated, continue with Toprol-XL 50 mg oral daily, discontinue amiodarone and digoxin (patient was not taking regardless) continue with full anticoagulation, patient will be discharged on Xarelto instead of Eliquis, as he can be provided with assistant coupon with Xarelto not Eliquis.     Chronic HFrEF  Nonischemic cardiomyopathy Moderate MR -2D echo in the past significant for EF 25 to 30%, significantly low this admission at<20% -GDMT due to low blood pressure, but he will discharge on Farxiga, metoprolol, and torsemide, and he will follow with cardiology as an outpatient, where he can be started on Aldactone and Entresto if blood pressure tolerates.   Status post Bentall and LAA clipping 2019/ Bicuspid aortic valve associated with moderate aortic regurgitation and thoracic aortic aneurysm  Needs to be on lifelong aspirin  Will also need dental prophylaxis.     HLD LDL is 81, continue with statin   AKI Baseline was around 1  four  years ago, it is 1.4 on admission   Hypertension -Please see above discussion   Medication noncompliance -TOC consulted, to help arrange for meds as an outpatient.  Substance abuse -patient tested positive for THC and amphetamine, he denies amphetamine use, but reports he  is vaping THC.     Discharge Diagnoses:  Active Problems:   Permanent atrial fibrillation (HCC)   Acute on chronic combined systolic and diastolic CHF (congestive heart failure) (HCC)   NICM (nonischemic cardiomyopathy) (HCC)   Slurred speech    Discharge Instructions  Discharge  Instructions     Ambulatory referral to Neurology   Complete by: As directed    Follow up with stroke clinic NP (Jessica Vanschaick or Darrol Angel, if both not available, consider Manson Allan, or Ahern) at [facility] in about 4 weeks. Thanks.   Diet - low sodium heart healthy   Complete by: As directed    Discharge instructions   Complete by: As directed    Follow with Primary MD Kallie Locks, FNP in 7 days   Get CBC, CMP,checked  by Primary MD next visit.    Activity: As tolerated with Full fall precautions use walker/cane & assistance as needed   Disposition Home   Diet: Heart Healthy   For Heart failure patients - Check your Weight same time everyday, if you gain over 2 pounds, or you develop in leg swelling, experience more shortness of breath or chest pain, call your Primary MD immediately. Follow Cardiac Low Salt Diet and 1.5 lit/day fluid restriction.   On your next visit with your primary care physician please Get Medicines reviewed and adjusted.   Please request your Prim.MD to go over all Hospital Tests and Procedure/Radiological results at the follow up, please get all Hospital records sent to your Prim MD by signing hospital release before you go home.   If you experience worsening of your admission symptoms, develop shortness of breath, life threatening emergency, suicidal or homicidal thoughts you must seek medical attention immediately by calling 911 or calling your MD immediately  if symptoms less severe.  You Must read complete instructions/literature along with all the possible adverse reactions/side effects for all the Medicines you take and that have been prescribed to you. Take any new Medicines after you have completely understood and accpet all the possible adverse reactions/side effects.   Do not drive, operating heavy machinery, perform activities at heights, swimming or participation in water activities or provide baby sitting services if your were  admitted for syncope or siezures until you have seen by Primary MD or a Neurologist and advised to do so again.  Do not drive when taking Pain medications.    Do not take more than prescribed Pain, Sleep and Anxiety Medications  Special Instructions: If you have smoked or chewed Tobacco  in the last 2 yrs please stop smoking, stop any regular Alcohol  and or any Recreational drug use.  Wear Seat belts while driving.   Please note  You were cared for by a hospitalist during your hospital stay. If you have any questions about your discharge medications or the care you received while you were in the hospital after you are discharged, you can call the unit and asked to speak with the hospitalist on call if the hospitalist that took care of you is not available. Once you are discharged, your primary care physician will handle any further medical issues. Please note that NO REFILLS for any discharge medications will be authorized once you are discharged, as it is imperative that you return to your primary care physician (or establish a relationship with a primary care physician if you do not have one) for your aftercare needs so that they can reassess your need for medications and monitor your  lab values.   Increase activity slowly   Complete by: As directed       Allergies as of 03/13/2023   No Known Allergies      Medication List     STOP taking these medications    amiodarone 100 MG tablet Commonly known as: PACERONE   apixaban 5 MG Tabs tablet Commonly known as: Eliquis   digoxin 0.125 MG tablet Commonly known as: LANOXIN   furosemide 20 MG tablet Commonly known as: LASIX   furosemide 40 MG tablet Commonly known as: LASIX       TAKE these medications    aspirin 81 MG chewable tablet Chew 1 tablet (81 mg total) by mouth daily. Start taking on: March 14, 2023   empagliflozin 10 MG Tabs tablet Commonly known as: JARDIANCE Take 1 tablet (10 mg total) by mouth  daily. Start taking on: March 14, 2023   metoprolol succinate 50 MG 24 hr tablet Commonly known as: TOPROL-XL Take 1 tablet (50 mg total) by mouth daily. Take with or immediately following a meal. Start taking on: March 14, 2023 What changed: See the new instructions.   potassium chloride SA 20 MEQ tablet Commonly known as: KLOR-CON M Take 1 tablet (20 mEq total) by mouth daily.   rivaroxaban 20 MG Tabs tablet Commonly known as: XARELTO Take 1 tablet (20 mg total) by mouth daily with supper. Start taking on: March 14, 2023   rosuvastatin 20 MG tablet Commonly known as: CRESTOR Take 1 tablet (20 mg total) by mouth daily. Start taking on: March 14, 2023   torsemide 20 MG tablet Commonly known as: DEMADEX Take 1 tablet (20 mg total) by mouth daily. Start taking on: March 14, 2023        Follow-up Information     [facility]. Schedule an appointment as soon as possible for a visit in 1 month(s).   Specialty: Neurology Why: stroke clinic Contact information: [address] [place] [address] [phone]        Ronney Asters, NP Follow up.   Specialty: Cardiology Why: Thursday Mar 26, 2023 Appt at 10:55 AM (25 min) Contact information: [address] [place] [address] [phone]         [facility]. Go in 17 day(s).   Specialty: Cardiology Why: Hospital follow up 03/30/23 @ 2 pm PLEASE bring a current medication list to appointment FREE valet parking, Entrance C, off [employer] information: [address] [place] [address] [phone]               No Known Allergies  Consultations: cardiology Neurology   Procedures/Studies: MR BRAIN WO CONTRAST  Result Date: 03/12/2023 CLINICAL DATA:  Stroke, follow up EXAM: MRI HEAD WITHOUT CONTRAST TECHNIQUE: Multiplanar, multiecho pulse  sequences of the brain and surrounding structures were obtained without intravenous contrast. COMPARISON:  Same day CT head and head/neck angiogram FINDINGS: Limitations: Motion degraded exam. Lack of FLAIR and susceptibility weighted sequences further limits assessment. Brain: No acute infarction, hemorrhage, hydrocephalus, extra-axial collection or mass lesion. Advanced generalized volume loss. 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 Vascular: Normal flow voids. Skull and upper cervical spine: Normal marrow signal. Small lipoma the left frontal scalp Sinuses/Orbits: Negative. Other: None. IMPRESSION: Within the limitations above, no acute intracranial abnormality. Electronically Signed   By: Lorenza Cambridge M.D.   On: 03/12/2023 13:38   ECHOCARDIOGRAM COMPLETE  Result  Date: 03/12/2023    ECHOCARDIOGRAM REPORT   Patient Name:   PETER KEYWORTH Date of Exam: 03/12/2023 Medical Rec #:  [id number]     Height:       75.0 in Accession #:    [id number]    Weight:       259.0 lb Date of Birth:  Jun 24, 1960      BSA:          2.449 m Patient Age:    62 years      BP:           119/50 mmHg Patient Gender: M             HR:           110 bpm. Exam Location:  Inpatient Procedure: 2D Echo, Cardiac Doppler, Color Doppler, 3D Echo, Intracardiac            Opacification Agent and Strain Analysis Indications:    Stroke I63.9  History:        Patient has prior history of Echocardiogram examinations, most                 recent 01/17/2018. CHF and Cardiomyopathy, Stroke,                 Arrythmias:Atrial Fibrillation; Risk Factors:Non-Smoker.  Sonographer:    Dondra Prader RVT RCS Referring Phys: [facility], MIR, M IMPRESSIONS  1. Left ventricular ejection fraction, by estimation, is <20%. The left ventricle has severely decreased function. The left ventricle demonstrates global hypokinesis. The left ventricular internal cavity size was severely dilated. Left ventricular diastolic parameters are indeterminate. No LV thrombus.   2. Right ventricular systolic function is moderately reduced. The right ventricular size is mildly enlarged. Tricuspid regurgitation signal is inadequate for assessing PA pressure.  3. Left atrial size was severely dilated.  4. Right atrial size was severely dilated.  5. The mitral valve is normal in structure. Moderate mitral valve regurgitation, posteriorly-directed and may be incompletely imaged. No evidence of mitral stenosis.  6. S/p Bentall procedure with bioprosthetic aortic valve. There is moderate calcification of the bioprosthetic aortic valve. Aortic valve regurgitation is not visualized. Mean gradient only 8 mmHg but likely low flow state with EF < 20%.  7. The inferior vena cava is dilated in size with >50% respiratory variability, suggesting right atrial pressure of 8 mmHg.  8. Aortic root/ascending aorta has been repaired/replaced. FINDINGS  Left Ventricle: Left ventricular ejection fraction, by estimation, is <20%. The left ventricle has severely decreased function. The left ventricle demonstrates global hypokinesis. The left ventricular internal cavity size was severely dilated. There is no left ventricular hypertrophy. Left ventricular diastolic parameters are indeterminate. Right Ventricle: The right ventricular size is mildly enlarged. No increase in right ventricular wall thickness. Right ventricular systolic function is moderately reduced. Tricuspid regurgitation signal is inadequate for assessing PA pressure. Left Atrium: Left atrial size was severely dilated. Right Atrium: Right atrial size was severely dilated. Pericardium: There is no evidence of pericardial effusion. Mitral Valve: The mitral valve is normal in structure. Moderate mitral valve regurgitation. No evidence of mitral valve stenosis. MV peak gradient, 88.7 mmHg. The mean mitral valve gradient is 57.0 mmHg. Tricuspid Valve: The tricuspid valve is normal in structure. Tricuspid valve regurgitation is not demonstrated. Aortic  Valve: The aortic valve has been repaired/replaced. There is moderate calcification of the aortic valve. Aortic valve regurgitation is not visualized. Mild aortic stenosis is present. Aortic valve mean gradient measures 8.0 mmHg. Aortic  valve peak  gradient measures 12.6 mmHg. Aortic valve area, by VTI measures 1.54 cm. Pulmonic Valve: The pulmonic valve was normal in structure. Pulmonic valve regurgitation is not visualized. Aorta: The aortic root/ascending aorta has been repaired/replaced. Venous: The inferior vena cava is dilated in size with greater than 50% respiratory variability, suggesting right atrial pressure of 8 mmHg. IAS/Shunts: No atrial level shunt detected by color flow Doppler.  LEFT VENTRICLE PLAX 2D LVIDd:         7.10 cm      Diastology LVIDs:         6.30 cm      LV e' medial:    4.78 cm/s LV PW:         1.40 cm      LV E/e' medial:  23.6 LV IVS:        0.70 cm      LV e' lateral:   7.45 cm/s LVOT diam:     1.90 cm      LV E/e' lateral: 15.2 LV SV:         39 LV SV Index:   16 LVOT Area:     2.84 cm                              3D Volume EF: LV Volumes (MOD)            3D EF:        23 % LV vol d, MOD A4C: 267.0 ml LV EDV:       307 ml LV vol s, MOD A4C: 234.0 ml LV ESV:       237 ml LV SV MOD A4C:     267.0 ml LV SV:        71 ml RIGHT VENTRICLE            IVC RV Basal diam:  4.80 cm    IVC diam: 2.70 cm RV S prime:     5.75 cm/s TAPSE (M-mode): 0.7 cm LEFT ATRIUM              Index         RIGHT ATRIUM           Index LA diam:        4.70 cm  1.92 cm/m    RA Area:     34.50 cm LA Vol (A2C):   256.0 ml 104.52 ml/m  RA Volume:   134.00 ml 54.71 ml/m LA Vol (A4C):   232.0 ml 94.72 ml/m LA Biplane Vol: 262.0 ml 106.97 ml/m  AORTIC VALVE                     PULMONIC VALVE AV Area (Vmax):    1.68 cm      PV Vmax:       0.60 m/s AV Area (Vmean):   1.60 cm      PV Peak grad:  1.4 mmHg AV Area (VTI):     1.54 cm AV Vmax:           177.80 cm/s AV Vmean:          118.600 cm/s AV VTI:             0.254 m AV Peak Grad:      12.6 mmHg AV Mean Grad:      8.0 mmHg LVOT Vmax:         105.32  cm/s LVOT Vmean:        66.900 cm/s LVOT VTI:          0.138 m LVOT/AV VTI ratio: 0.54  AORTA Ao Root diam: 3.60 cm Ao Asc diam:  3.50 cm MITRAL VALVE MV Area (PHT): 7.90 cm     SHUNTS MV Area VTI:   0.29 cm     Systemic VTI:  0.14 m MV Peak grad:  88.7 mmHg    Systemic Diam: 1.90 cm MV Mean grad:  57.0 mmHg MV Vmax:       4.71 m/s MV Vmean:      358.0 cm/s MV Decel Time: 96 msec MV E velocity: 113.00 cm/s Dalton McleanMD Electronically signed by Wilfred Lacy Signature Date/Time: 03/12/2023/10:07:00 AM    Final    DG Chest Port 1 View  Result Date: 03/12/2023 CLINICAL DATA:  Sudden onset respiratory distress, initial encounter EXAM: PORTABLE CHEST 1 VIEW COMPARISON:  11/28/2022 FINDINGS: Cardiac shadow is enlarged. Central vascular congestion is noted with interstitial edema consistent with CHF. Postsurgical changes are noted. No bony abnormality is seen. IMPRESSION: Changes of CHF new from the prior exam. Electronically Signed   By: Alcide Clever M.D.   On: 03/12/2023 00:36   CT ANGIO HEAD NECK W WO CM W PERF (CODE STROKE)  Result Date: 03/11/2023 CLINICAL DATA:  Neuro deficit, acute, stroke suspected. Right-sided weakness. EXAM: CT ANGIOGRAPHY HEAD AND NECK CT PERFUSION BRAIN TECHNIQUE: Multidetector CT imaging of the head and neck was performed using the standard protocol during bolus administration of intravenous contrast. Multiplanar CT image reconstructions and MIPs were obtained to evaluate the vascular anatomy. Carotid stenosis measurements (when applicable) are obtained utilizing NASCET criteria, using the distal internal carotid diameter as the denominator. Multiphase CT imaging of the brain was performed following IV bolus contrast injection. Subsequent parametric perfusion maps were calculated using RAPID software. RADIATION DOSE REDUCTION: This exam was performed according to the departmental  dose-optimization program which includes automated exposure control, adjustment of the mA and/or kV according to patient size and/or use of iterative reconstruction technique. CONTRAST:  OMNIPAQUE IOHEXOL 350 MG/ML SOLN COMPARISON:  MRA head and CTA neck 12/24/2016 FINDINGS: CTA NECK FINDINGS Aortic arch: Normal variant aortic arch branching pattern with common origin of the brachiocephalic and left common carotid arteries. Mild atherosclerosis without a significant stenosis of the arch vessel origins. Right carotid system: Patent with a small amount of calcified plaque at the carotid bifurcation. No evidence of a significant stenosis or dissection. Left carotid system: Patent with minimal calcified and soft plaque at the carotid bifurcation. No evidence of a significant stenosis or dissection. Vertebral arteries: Patent without evidence of a significant stenosis or dissection. Strongly dominant right vertebral artery which does not enter the transverse foramina until the C4 level. Skeleton: Moderate cervical spondylosis. Other neck: No evidence of cervical lymphadenopathy or mass. Upper chest: Clear lung apices. Review of the MIP images confirms the above findings CTA HEAD FINDINGS Anterior circulation: The internal carotid arteries are patent from skull base to carotid termini with mild atherosclerosis bilaterally not resulting in significant stenosis. ACAs and MCAs are patent without evidence of a proximal branch occlusion or significant proximal stenosis. No aneurysm is identified. Posterior circulation: The intracranial right vertebral artery is widely patent and supplies the basilar. The left vertebral artery is hypoplastic and functionally terminates in PICA. Patent PICA and SCA origins are visualized bilaterally. The basilar artery is patent and congenitally small without a significant focal stenosis. There are large  posterior communicating arteries and hypoplastic P1 segments bilaterally. Both PCAs  are patent without evidence of a significant proximal stenosis. No aneurysm is identified. Venous sinuses: As permitted by contrast timing, patent. Anatomic variants: Fetal origin of the PCAs. Hypoplastic left vertebral artery. Review of the MIP images confirms the above findings CT Brain Perfusion Findings: ASPECTS: 10 CBF (<30%) Volume: 0 mL Perfusion (Tmax>6.0s) volume: 76 mL, scattered in a patchy and largely symmetric fashion throughout both cerebral hemispheres and in the posterior fossa and favored to be artifactual. Mismatch Volume: 76 mL and likely artifactual as noted above Infarction Location: No acute core infarct identified by CTP. Findings discussed with Dr. Amada Jupiter by telephone on 03/11/2023 at 2:20 p.m. IMPRESSION: 1. Mild atherosclerosis in the head and neck without a large vessel occlusion or significant proximal stenosis. 2. No evidence of an acute core infarct by CTP. Likely artifactual mismatch volume. 3.  Aortic Atherosclerosis (ICD10-I70.0). Electronically Signed   By: Sebastian Ache M.D.   On: 03/11/2023 14:44   CT HEAD CODE STROKE WO CONTRAST  Result Date: 03/11/2023 CLINICAL DATA:  Code stroke. Neuro deficit, acute, stroke suspected. Right-sided weakness. EXAM: CT HEAD WITHOUT CONTRAST TECHNIQUE: Contiguous axial images were obtained from the base of the skull through the vertex without intravenous contrast. RADIATION DOSE REDUCTION: This exam was performed according to the departmental dose-optimization program which includes automated exposure control, adjustment of the mA and/or kV according to patient size and/or use of iterative reconstruction technique. COMPARISON:  Head CT 01/24/2017 and MRI 12/24/2016 FINDINGS: Brain: A chronic infarct is again seen involving the posterior left frontal lobe and insula with mild ex vacuo dilatation of the left lateral ventricle. A small cortical infarct posteriorly in the right cerebral hemisphere at the junction of the temporal, occipital, and  parietal lobes and a small cortical infarct in the posterior right frontal lobe are both new but chronic in appearance. There is also a new small chronic right cerebellar infarct. No definite acute infarct, intracranial hemorrhage, mass, midline shift, or extra-axial fluid collection is identified. There is mild cerebral atrophy. Vascular: Calcified atherosclerosis at the skull base. No hyperdense vessel. Skull: No acute fracture or suspicious osseous lesion. Sinuses/Orbits: Paranasal sinuses and mastoid air cells are clear. Unremarkable orbits. Other: None. ASPECTS (Alberta Stroke Program Early CT Score) - Ganglionic level infarction (caudate, lentiform nuclei, internal capsule, insula, M1-M3 cortex): 7 - Supraganglionic infarction (M4-M6 cortex): 3 Total score (0-10 with 10 being normal): 10 These results were communicated to Dr. Amada Jupiter at 2:10 pm on 03/11/2023 by text page via the [employer] messaging system. IMPRESSION: 1. No evidence of acute intracranial abnormality. ASPECTS of 10. 2. Multiple chronic infarcts as above. ASPECTS is Electronically Signed   By: Sebastian Ache M.D.   On: 03/11/2023 14:10      Subjective:  No significant events overnight, he reports generalized weakness and fatigue today Discharge Exam: Vitals:   03/13/23 1234 03/13/23 1237  BP: (!) 111/97 96/84  Pulse: 93   Resp: 18   Temp: 97.8 F (36.6 C)   SpO2: 94%    Vitals:   03/13/23 0500 03/13/23 0817 03/13/23 1234 03/13/23 1237  BP:  102/82 (!) 111/97 96/84  Pulse:  96 93   Resp:  20 18   Temp:  97.8 F (36.6 C) 97.8 F (36.6 C)   TempSrc:  Oral Axillary   SpO2:  94% 94%   Weight: 109.5 kg     Height:        General: Pt is  alert, awake, not in acute distress Cardiovascular: RRR, S1/S2 +, no rubs, no gallops Respiratory: CTA bilaterally, no wheezing, no rhonchi Abdominal: Soft, NT, ND, bowel sounds + Extremities: no edema, no cyanosis    The results of significant diagnostics from this hospitalization  (including imaging, microbiology, ancillary and laboratory) are listed below for reference.     Microbiology: No results found for this or any previous visit (from the past 240 hour(s)).   Labs: BNP (last 3 results) Recent Labs    03/12/23 0053  BNP 794.9*   Basic Metabolic Panel: Recent Labs  Lab 03/11/23 1346 03/11/23 1353 03/12/23 0339 03/13/23 0527  NA 138 141 137 136  K 3.3* 3.4* 3.2* 3.7  CL 105 104 101 100  CO2 27  --  24 24  GLUCOSE 89 89 95 93  BUN 18 17 16 18   CREATININE 1.32* 1.40* 1.38* 1.42*  CALCIUM 8.7*  --  8.6* 8.6*  MG  --   --  1.9  --    Liver Function Tests: Recent Labs  Lab 03/11/23 1346  AST 31  ALT 25  ALKPHOS 44  BILITOT 1.8*  PROT 7.0  ALBUMIN 3.7   No results for input(s): "LIPASE", "AMYLASE" in the last 168 hours. No results for input(s): "AMMONIA" in the last 168 hours. CBC: Recent Labs  Lab 03/11/23 1346 03/11/23 1353 03/13/23 0527  WBC 7.3  --  5.7  NEUTROABS 5.1  --   --   HGB 14.4 15.3 15.2  HCT 44.8 45.0 46.2  MCV 93.7  --  89.4  PLT 205  --  190   Cardiac Enzymes: No results for input(s): "CKTOTAL", "CKMB", "CKMBINDEX", "TROPONINI" in the last 168 hours. BNP: Invalid input(s): "POCBNP" CBG: Recent Labs  Lab 03/11/23 1344  GLUCAP 123*   D-Dimer No results for input(s): "DDIMER" in the last 72 hours. Hgb A1c Recent Labs    03/11/23 2139  HGBA1C 5.6   Lipid Profile Recent Labs    03/12/23 0339  CHOL 126  HDL 32*  LDLCALC 81  TRIG 65  CHOLHDL 3.9   Thyroid function studies No results for input(s): "TSH", "T4TOTAL", "T3FREE", "THYROIDAB" in the last 72 hours.  Invalid input(s): "FREET3" Anemia work up No results for input(s): "VITAMINB12", "FOLATE", "FERRITIN", "TIBC", "IRON", "RETICCTPCT" in the last 72 hours. Urinalysis    Component Value Date/Time   COLORURINE YELLOW 01/17/2018 2014   APPEARANCEUR CLEAR 01/17/2018 2014   LABSPEC 1.004 (L) 01/17/2018 2014   PHURINE 6.0 01/17/2018 2014    GLUCOSEU NEGATIVE 01/17/2018 2014   HGBUR MODERATE (A) 01/17/2018 2014   BILIRUBINUR NEGATIVE 01/17/2018 2014   BILIRUBINUR negative 12/15/2017 1605   KETONESUR NEGATIVE 01/17/2018 2014   PROTEINUR NEGATIVE 01/17/2018 2014   UROBILINOGEN 0.2 12/15/2017 1605   UROBILINOGEN 0.2 01/13/2017 1352   NITRITE NEGATIVE 01/17/2018 2014   LEUKOCYTESUR NEGATIVE 01/17/2018 2014   Sepsis Labs Recent Labs  Lab 03/11/23 1346 03/13/23 0527  WBC 7.3 5.7   Microbiology No results found for this or any previous visit (from the past 240 hour(s)).   Time coordinating discharge: Over 30 minutes  SIGNED:   Huey Bienenstock, MD  [facility] 03/13/2023, 1:30 PM Pager   If 7PM-7AM, please contact night-coverage [email or web address]

## 2023-03-13 NOTE — Progress Notes (Addendum)
Patient seen and examined, note reviewed with the signed Advanced Practice Provider. I personally reviewed laboratory data, imaging studies and relevant notes. I independently examined the patient and formulated the important aspects of the plan. I have personally discussed the plan with the patient and/or family. Comments or changes to the note/plan are indicated below.  Breathing is improved. Reports still feeling very fatigued. Hoping to go home today. Blood pressure remains too low to add more GDMT for cardiomyopathy, so will have to use SGLT2i and metoprolol for now. He was off all meds prior to admission, will need to slowly reintroduce GDMT as able. With permanent afib, aim for rate control. Would not use amiodarone or digoxin.  [facility] will sign off.   Medication Recommendations:   DO NOT RESTART amiodarone, digoxin, furosemide CONTINUE apixaban. CHANGE metoprolol succinate to 50 mg once a day START empagliflozin 10 mg daily, torsemide 20 mg daily  Aspirin, statin per neuro guidelines  Other recommendations (labs, testing, etc):  none Follow up as an outpatient:  Has appt with Edd Fabian on 03/26/23.   Jodelle Red, MD, PhD, [facility]  [facility]  [place]  [facility] at [facility] [address], [address] [place], [place] [address] [phone]       Patient Name: Caleb [name] Date of Encounter: 03/13/2023 [facility] Cardiologist: Thurmon Fair, MD   Interval Summary  .    Feels significantly better today.  Slurred speech has improved.  Denies any shortness of breath.  Vital Signs .    Vitals:   03/12/23 2013 03/13/23 0026 03/13/23 0444 03/13/23 0500  BP: 101/80 101/85 (!) 108/93   Pulse: 93 96 89   Resp: 19 19 20    Temp: (!) 97.1 F (36.2 C) 98 F (36.7 C) (!) 97.2 F (36.2 C)   TempSrc: Oral Oral Oral   SpO2: 94% 95% 95%   Weight:    109.5 kg  Height:         Intake/Output Summary (Last 24 hours) at 03/13/2023 0820 Last data filed at 03/12/2023 2013 Gross per 24 hour  Intake --  Output 3175 ml  Net -3175 ml      03/13/2023    5:00 AM 03/11/2023    7:49 PM 04/14/2019   11:54 AM  Last 3 Weights  Weight (lbs) 241 lb 6.5 oz 259 lb 260 lb  Weight (kg) 109.5 kg 117.482 kg 117.935 kg      Telemetry/ECG    Atrial fibrillation heart rates between 90-100 occasional PVCs.- Personally Reviewed  CV Studies    Echocardiogram 03/12/2023  1. Left ventricular ejection fraction, by estimation, is <20%. The left  ventricle has severely decreased function. The left ventricle demonstrates  global hypokinesis. The left ventricular internal cavity size was severely  dilated. Left ventricular  diastolic parameters are indeterminate. No LV thrombus.   2. Right ventricular systolic function is moderately reduced. The right  ventricular size is mildly enlarged. Tricuspid regurgitation signal is  inadequate for assessing PA pressure.   3. Left atrial size was severely dilated.   4. Right atrial size was severely dilated.   5. The mitral valve is normal in structure. Moderate mitral valve  regurgitation, posteriorly-directed and may be incompletely imaged. No  evidence of mitral stenosis.   6. S/p Bentall procedure with bioprosthetic aortic valve. There is  moderate calcification of the bioprosthetic aortic valve. Aortic valve  regurgitation is not visualized. Mean gradient only 8  mmHg but likely low  flow state with EF < 20%.   7. The inferior vena cava is dilated in size with >50% respiratory  variability, suggesting right atrial pressure of 8 mmHg.   8. Aortic root/ascending aorta has been repaired/replaced.   Physical Exam .   GEN: No acute distress.   Neck: No JVD Cardiac: RRR, no murmurs, rubs, or gallops.  Respiratory: Clear to auscultation bilaterally. GI: Soft, nontender, non-distended  MS: No edema  Patient Profile    Caleb [name] is  a 62 y.o. male has hx of  persistent longstanding atrial fibrillation, chronic HFrEF, bicuspid aortic valve associated with moderate aortic regurgitation and thoracic aortic aneurysm status post Bentall procedure 2019 and LAA clipping, previous CVA 2018  and admitted on 03/12/2023 for the evaluation of possible stroke. Cardiology managing afib and HFreEF  Brain MRI and other imaging did not show evidence of stroke.  Possible TIA?  Assessment & Plan .     Permanent atrial fibrillation History of left atrial clip Stable heart rates generally around 90-100. Continue Toprol-XL 50 mg daily.  Will not continue his amio or digoxin.  Continue Eliquis 5 mg.  Chronic HFrEF 25-30% Nonischemic cardiomyopathy Moderate MR Etiology is not entirely certain.  Had previous cardiac catheterization that noted normal coronary anatomy in 2019.  Previous valvular heart disease also not consistent with his cardiomyopathy.  No improvement post Bentall procedure.  Persistently low EF in the 20s to 30s.  Has not been on GDMT consistently.  Echocardiogram this admission shows EF less than 20%, global hypokinesis.  Moderately reduced RV function.  Severely dilated atria.  Did very well with diuretics.  -3.1 L with significant improvement in symptoms and feels back at baseline with no more shortness of breath/orthopnea.  Will transition to oral diuretics today.  Will avoid hypotension given possible TIA.  Transition to torsemide 20 mg daily.  Will start Jardiance 10mg .  I do not think his blood pressure will tolerate Entresto currently.  PTA was on Lasix 40 mg daily.  Reported only taking 20mg  daily. Reported doing very well on Entresto we will attempt to restart this here. If no improvement on GDMT may consider ICD.   Status post Bentall and LAA clipping 2019 Bicuspid aortic valve associated with moderate aortic regurgitation and thoracic aortic aneurysm  Needs to be on lifelong aspirin and likely Eliquis too given  recurrent CVA.Will also need dental prophylaxis.  Moderate calcification of the bioprosthetic aortic valve, mean gradient of 8 mmHg suggesting low-flow state.   HLD 81. Continue statin.    AKI Continue to follow. Currently 1.38>1.42  +amphetamines, THC   For questions or updates, please contact [facility] Please consult [email or web address] for contact info under        Signed, Abagail Kitchens, PA-C

## 2023-03-16 NOTE — Telephone Encounter (Signed)
Patient contacted regarding discharge from [facility] on March 13, 2023  Patient understands to follow up with provider J. Cleaver on 10/17 at 10:55 at [facility] office Patient understands discharge instructions? Yes Patient understands medications and regiment?Yes Patient understands to bring all medications to this visit? Yes  Ask patient:  Are you enrolled in My Chart : New text sent to initiate               Do you have any questions about your medications?  All medications (except pain medications) are to be filled by your Cardiologist AFTER your first post op appointment with them.  Are you taking your pain medication? None              How is your pain controlled? Pain level?  No pain              If you require a refill on pain medications, know that the same medication/ amount may not be prescribed or a refill may not be given.  Please contact your pharmacy for refill requests.               Do you have help at home with ADL's?  If you have home health, have you been contacted or seen by the agency? None needed

## 2023-03-25 NOTE — Progress Notes (Unsigned)
Cardiology Office Note:    Date:  03/26/2023  ID:  Caleb [name], DOB Jul 17, 1960, MRN [id number] PCP: Kallie Locks, FNP  [facility] Providers Cardiologist:  Thurmon Fair, MD       Patient Profile:      Permanent atrial fibrillation Hypertension Chronic heart failure with reduced ejection fraction EF 25 to 30% Bicuspid aortic valve 2019: Bentall procedure and LAA clipping Moderate aortic regurgitation and thoracic aort aneruysm      History of Present Illness:   Caleb [name] is a 62 y.o. male who returns for hospital follow-up with regards to congestive heart failure, atrial fibrillation.   He was previously seen by Dr. Skip Estimable in 2019 and 2020.  He has been in [place] for the past several months seeing a cardiologist there.  He noted being out of his medications for several years until he finally followed up with someone in [place].  Due to issues with Medicaid he has been out of his medications for over a month prior to his admission.  He was admitted on 03/11/2023 with concerns for CVA and A-fib with RVR.  Was noted he began to have difficulty speaking and right sided numbness in his arm.  When he arrived to the ED he was found to be in A-fib with a rate of 131.  CT head and CTA head and neck showed prior chronic infarction but no acute occlusion or CVA.  Sudden onset of dysarthria and right arm numbness resolved by discharge.  MRI of his brain showed no evidence of stroke. UDS positive for amphetamine and THC.   He has chronic A-fib first recognized in 2019 after he had a left atrial appendage clipping.  He did have sporadic follow-up with cardiology last seen by them in 202o.  It was noted patient was only taking his Lasix routinely and may have only took his Eliquis about a month before admission.  Noted that he has not taken the metoprolol because it is too much money.  Echo on admission shows EF less than 20% with severe left ventricle dilation, RV function  moderately reduced.  Severe biatrial enlargement.  Moderate MR. Bentall stable.  His EF has consistently been around 20 to 25% and that he has been inconsistent with his GDMT.  Given his severely depressed EF ICD was considered however not able to adequately track progress with inconsistent use of GDMT.  He also has known bicuspid aortic valve associated with moderate aortic regurgitation and thoracic aortic aneurysm.  He underwent a Bentall procedure in August 19 with LAA clipping with no change in his EF.  Etiology of his heart failure uncertain at this time, previous Cardiac catheterization noted normal coronary anatomy in 2019.  He was started on aspirin 81 mg, Jardiance 10 mg, Toprol XL 50 mg, Xarelto 20 mg, Crestor 20 mg, Demadex 20 mg.  Today patient notes that he is doing okay.  He notes compliance with all prescribed medications.  He notes that he is fatigued, tired.  He notes that he can now walk across the street without getting extremely winded.  He notes that he is actually working out 4 times a week but only lifting weights.  He notes that he is not able to walk on a treadmill due to being very short of breath.  His admission weight on the hospital was 259, weight today is 242.  Patient notes that he is urinating a lot daily.  He does note that he has been drinking a  lot, educated them on 64 ounce daily limit.  He is able to lie flat in bed, notes that his sinuses tend to get stopped up at night.  Not wearing compression stockings, but he is elevating his legs at night, no lower leg edema appreciated on exam.  We spoke about his UDS that's positive for amphetamines.  He denied any use of amphetamines.  Notes that he does smoke a THC pen. We discussed the results of his ECHO during admission and went over the purpose for each medication.  His blood pressure today is 108/70, repeat 106/68.  He does not get dizzy and/or lightheaded at home or at the gym.  He is not taking his blood pressure at home.   Would like to get patient started on either Entresto or spironolactone.  Feel as if we will drop his blood pressure too much if these agents are started at this time.  Does note like he is feeling better moving around more so progress is being made.  Does not seem to be overdiuresed on exam.  He continues to stay in permanent atrial fibrillation rate today is 79 bpm.  He notes compliance with his Toprol-XL 50 milligrams and Xarelto.  He denies any feelings of tachycardia, irregular heartbeats, palpitations.   He has an appointment with advanced heart failure next week hopefully blood pressure improves so we can continue GDMT and speak regarding ICD.  He denies chest pain, lower extremity edema, palpitations, melena, hematuria, hemoptysis, diaphoresis, presyncope, syncope, orthopnea, and PND.          Review of Systems  Constitutional: Negative for weight gain and weight loss.  Cardiovascular:  Negative for chest pain, claudication, cyanosis, dyspnea on exertion, irregular heartbeat, leg swelling, near-syncope, orthopnea, palpitations, paroxysmal nocturnal dyspnea and syncope.  Respiratory:  Positive for shortness of breath. Negative for cough and hemoptysis.   Gastrointestinal:  Negative for abdominal pain, hematochezia and melena.  Genitourinary:  Negative for hematuria.  Neurological:  Negative for dizziness, headaches and light-headedness.     See HPI     Studies Reviewed:       Echocardiogram 04/08/2023  IMPRESSIONS    1. Left ventricular ejection fraction, by estimation, is <20%. The left  ventricle has severely decreased function. The left ventricle demonstrates  global hypokinesis. The left ventricular internal cavity size was severely  dilated. Left ventricular  diastolic parameters are indeterminate. No LV thrombus.   2. Right ventricular systolic function is moderately reduced. The right  ventricular size is mildly enlarged. Tricuspid regurgitation signal is  inadequate  for assessing PA pressure.   3. Left atrial size was severely dilated.   4. Right atrial size was severely dilated.   5. The mitral valve is normal in structure. Moderate mitral valve  regurgitation, posteriorly-directed and may be incompletely imaged. No  evidence of mitral stenosis.   6. S/p Bentall procedure with bioprosthetic aortic valve. There is  moderate calcification of the bioprosthetic aortic valve. Aortic valve  regurgitation is not visualized. Mean gradient only 8 mmHg but likely low  flow state with EF < 20%.   7. The inferior vena cava is dilated in size with >50% respiratory  variability, suggesting right atrial pressure of 8 mmHg.   8. Aortic root/ascending aorta has been repaired/replaced.    Risk Assessment/Calculations:    CHA2DS2-VASc Score = 4   This indicates a 4.8% annual risk of stroke. The patient's score is based upon: CHF History: 1 HTN History: 1 Diabetes History: 0 Stroke History:  2 Vascular Disease History: 0 Age Score: 0 Gender Score: 0            Physical Exam:   VS:  BP 108/70 (BP Location: Left Arm, Patient Position: Sitting, Cuff Size: Normal)   Pulse 79   Ht 6\' 3"  (1.905 m)   Wt 242 lb 8 oz (110 kg)   BMI 30.31 kg/m    Wt Readings from Last 3 Encounters:  03/26/23 242 lb 8 oz (110 kg)  03/13/23 241 lb 6.5 oz (109.5 kg)  04/14/19 260 lb (117.9 kg)    Constitutional:      Appearance: Normal and healthy appearance.  Neck:     Vascular: JVD normal.  Pulmonary:     Effort: Pulmonary effort is normal.     Breath sounds: Normal breath sounds.  Chest:     Chest wall: Not tender to palpatation.  Cardiovascular:     PMI at left midclavicular line. Normal rate. Regular rhythm. Normal S1. Normal S2.      Murmurs: There is no murmur.     No gallop.  No click. No rub.  Pulses:    Intact distal pulses.  Edema:    Peripheral edema absent.  Musculoskeletal:     Cervical back: Neck supple. Skin:    General: Skin is warm.  Neurological:      General: No focal deficit present.     Mental Status: Oriented to person, place and time.  Psychiatric:        Behavior: Behavior is cooperative.        Assessment and Plan:  Permanent atrial fibrillation -CHA2DS2-VASc Score = 4, This indicates a 4.8% annual risk of stroke. -s/p left artial clip 2019 -Plan is for rate control  -EKG today atrial fibrillation with 79 bpm, rate controlled -Continue Toprol XL 50 mg, Xarelto 20 mg  Bicuspid aortic valve s/p Bentall and LAA clipping 2019 -Associated with moderate aortic regurgitation and thoracic aortic aneurysm -Echo 03/12/2023 bioprosthetic aortic valve with moderate calcification.  Mean gradient 8 mmhg -Continue aspirin 81 mg  Hyperlipidemia -LDL 81 on 04/08/2023 -Continue rosuvastatin 20 mg -High-fiber diet  Chronic HFrEF /nonischemic cardiomyopathy -Echocardiogram shows EF less than 20%, global hypokinesis, moderately reduced RV function, severely dilated aorta -No improvement post Bentall procedure, persistent low EF  -Unable to start spironolactone or Entresto at this time as I do not think his BP is able to tolerate  -Continue Jardiance 10 mg, metoprolol 50 kg, Demadex 20 mg -Follows up with heart failure next week -Does not seem to be over diuresed -Continue daily weights, 64 ounce fluid restriction -Continue physical exercise as tolerated  Hypertension -Blood pressure today 108/70 -Start blood pressure log at home -Continue metoprolol succinate 50 mg -Heart healthy diet, continue physical exercise  AKI -Creatinine 1.38>1.42 - BMP today                  Dispo:  Return in about 1 month (around 04/26/2023).  Signed, Denyce Robert, AGNP-C

## 2023-03-26 ENCOUNTER — Encounter: Payer: Self-pay | Admitting: Emergency Medicine

## 2023-03-26 ENCOUNTER — Ambulatory Visit: Payer: MEDICAID | Attending: General Practice | Admitting: Emergency Medicine

## 2023-03-26 VITALS — BP 108/70 | HR 79 | Ht 75.0 in | Wt 242.5 lb

## 2023-03-26 DIAGNOSIS — I5020 Unspecified systolic (congestive) heart failure: Secondary | ICD-10-CM

## 2023-03-26 DIAGNOSIS — I4891 Unspecified atrial fibrillation: Secondary | ICD-10-CM

## 2023-03-26 DIAGNOSIS — I428 Other cardiomyopathies: Secondary | ICD-10-CM

## 2023-03-26 DIAGNOSIS — I10 Essential (primary) hypertension: Secondary | ICD-10-CM

## 2023-03-26 DIAGNOSIS — N179 Acute kidney failure, unspecified: Secondary | ICD-10-CM

## 2023-03-26 DIAGNOSIS — E782 Mixed hyperlipidemia: Secondary | ICD-10-CM

## 2023-03-26 NOTE — Patient Instructions (Signed)
Medication Instructions:  The current medical regimen is effective;  continue present plan and medications as directed. Please refer to the Current Medication list given to you today.  *If you need a refill on your cardiac medications before your next appointment, please call your pharmacy*  Lab Work: BMET TODAY If you have labs (blood work) drawn today and your tests are completely normal, you will receive your results only by: MyChart Message (if you have MyChart) OR  A paper copy in the mail If you have any lab test that is abnormal or we need to change your treatment, we will call you to review the results.  Other Instructions TAKE AND LOG YOUR BLOOD PRESSURE DAILY INCREASE PHYSICAL ACTIVITY-AS TOLERATED  Follow-Up: At [facility], you and your health needs are our priority.  As part of our continuing mission to provide you with exceptional heart care, we have created designated Provider Care Teams.  These Care Teams include your primary Cardiologist (physician) and Advanced Practice Providers (APPs -  Physician Assistants and Nurse Practitioners) who all work together to provide you with the care you need, when you need it.  We recommend signing up for the patient portal called "MyChart".  Sign up information is provided on this After Visit Summary.  MyChart is used to connect with patients for Virtual Visits (Telemedicine).  Patients are able to view lab/test results, encounter notes, upcoming appointments, etc.  Non-urgent messages can be sent to your provider as well.   To learn more about what you can do with MyChart, go to [email or web address].    Your next appointment:   1 month(s)  Provider:   Thurmon Fair, MD  or Edd Fabian, FNP

## 2023-03-30 ENCOUNTER — Encounter (HOSPITAL_COMMUNITY): Payer: No Typology Code available for payment source

## 2023-04-13 ENCOUNTER — Other Ambulatory Visit (HOSPITAL_COMMUNITY): Payer: Self-pay

## 2023-04-13 ENCOUNTER — Other Ambulatory Visit: Payer: Self-pay | Admitting: Cardiovascular Disease

## 2023-04-13 MED ORDER — ASPIRIN 81 MG PO CHEW
81.0000 mg | CHEWABLE_TABLET | Freq: Every day | ORAL | 0 refills | Status: DC
  Filled 2023-04-13 – 2023-04-14 (×2): qty 30, 30d supply, fill #0

## 2023-04-13 MED ORDER — EMPAGLIFLOZIN 10 MG PO TABS
10.0000 mg | ORAL_TABLET | Freq: Every day | ORAL | 0 refills | Status: AC
  Filled 2023-04-13 – 2023-04-14 (×2): qty 30, 30d supply, fill #0

## 2023-04-13 MED ORDER — METOPROLOL SUCCINATE ER 50 MG PO TB24
50.0000 mg | ORAL_TABLET | Freq: Every day | ORAL | 0 refills | Status: DC
  Filled 2023-04-13 – 2023-04-14 (×2): qty 30, 30d supply, fill #0

## 2023-04-13 MED ORDER — POTASSIUM CHLORIDE CRYS ER 20 MEQ PO TBCR
20.0000 meq | EXTENDED_RELEASE_TABLET | Freq: Every day | ORAL | 0 refills | Status: DC
  Filled 2023-04-13 – 2023-04-14 (×2): qty 30, 30d supply, fill #0

## 2023-04-13 MED ORDER — ROSUVASTATIN CALCIUM 20 MG PO TABS
20.0000 mg | ORAL_TABLET | Freq: Every day | ORAL | 0 refills | Status: DC
  Filled 2023-04-13 – 2023-04-14 (×2): qty 30, 30d supply, fill #0

## 2023-04-13 MED ORDER — TORSEMIDE 20 MG PO TABS
20.0000 mg | ORAL_TABLET | Freq: Every day | ORAL | 0 refills | Status: DC
  Filled 2023-04-13 – 2023-04-14 (×2): qty 30, 30d supply, fill #0

## 2023-04-13 MED ORDER — RIVAROXABAN 20 MG PO TABS
20.0000 mg | ORAL_TABLET | Freq: Every day | ORAL | 0 refills | Status: DC
  Filled 2023-04-13 – 2023-04-17 (×3): qty 30, 30d supply, fill #0

## 2023-04-14 ENCOUNTER — Other Ambulatory Visit (HOSPITAL_COMMUNITY): Payer: Self-pay

## 2023-04-14 ENCOUNTER — Other Ambulatory Visit: Payer: Self-pay

## 2023-04-14 ENCOUNTER — Other Ambulatory Visit (HOSPITAL_BASED_OUTPATIENT_CLINIC_OR_DEPARTMENT_OTHER): Payer: Self-pay

## 2023-04-14 ENCOUNTER — Telehealth: Payer: Self-pay | Admitting: Emergency Medicine
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
## 2023-04-14 NOTE — Telephone Encounter (Signed)
Patient assistance application for jardiance and Xarelto put in envelope and left at front desk for patient to pick up.

## 2023-04-15 ENCOUNTER — Other Ambulatory Visit: Payer: Self-pay

## 2023-04-17 ENCOUNTER — Other Ambulatory Visit (HOSPITAL_COMMUNITY): Payer: Self-pay

## 2023-04-17 ENCOUNTER — Other Ambulatory Visit: Payer: Self-pay

## 2023-04-17 ENCOUNTER — Encounter: Payer: Self-pay | Admitting: General Practice

## 2023-04-17 ENCOUNTER — Ambulatory Visit: Payer: MEDICAID | Attending: General Practice | Admitting: General Practice

## 2023-04-17 VITALS — BP 106/86 | HR 79 | Ht 75.0 in | Wt 261.4 lb

## 2023-04-17 DIAGNOSIS — E782 Mixed hyperlipidemia: Secondary | ICD-10-CM

## 2023-04-17 DIAGNOSIS — I10 Essential (primary) hypertension: Secondary | ICD-10-CM

## 2023-04-17 DIAGNOSIS — I5020 Unspecified systolic (congestive) heart failure: Secondary | ICD-10-CM

## 2023-04-17 DIAGNOSIS — I4891 Unspecified atrial fibrillation: Secondary | ICD-10-CM

## 2023-04-17 MED ORDER — RIVAROXABAN 20 MG PO TABS: 20.0000 mg | ORAL_TABLET | Freq: Every day | ORAL | 0 refills | Status: DC

## 2023-04-17 NOTE — Patient Instructions (Signed)
Medication Instructions:  The current medical regimen is effective;  continue present plan and medications as directed. Please refer to the Current Medication list given to you today.  *If you need a refill on your cardiac medications before your next appointment, please call your pharmacy*  Lab Work: NONE  OTHER: TAKE AND LOG YOU BLOOD PRESSURE AND WEIGHT FLUID RESTRICTION <64 OUNCES DAILY FILL OUT AND DROP OFF YOUR PATIENT ASSISTANCE FORMS PLEASE READ AND FOLLOW ATTACHED  SALTY 6   Follow-Up: At [facility], you and your health needs are our priority.  As part of our continuing mission to provide you with exceptional heart care, we have created designated Provider Care Teams.  These Care Teams include your primary Cardiologist (physician) and Advanced Practice Providers (APPs -  Physician Assistants and Nurse Practitioners) who all work together to provide you with the care you need, when you need it.  We recommend signing up for the patient portal called "MyChart".  Sign up information is provided on this After Visit Summary.  MyChart is used to connect with patients for Virtual Visits (Telemedicine).  Patients are able to view lab/test results, encounter notes, upcoming appointments, etc.  Non-urgent messages can be sent to your provider as well.   To learn more about what you can do with MyChart, go to [email or web address].    Your next appointment:   2-3 month(s)  Provider:   Thurmon Fair, MD  or Edd Fabian, FNP

## 2023-04-20 ENCOUNTER — Telehealth: Payer: Self-pay | Admitting: Licensed Clinical Social Worker

## 2023-04-22 ENCOUNTER — Telehealth (HOSPITAL_BASED_OUTPATIENT_CLINIC_OR_DEPARTMENT_OTHER): Payer: Self-pay | Admitting: Licensed Clinical Social Worker

## 2023-04-22 NOTE — Telephone Encounter (Signed)
H&V Care Navigation CSW Progress Note  Clinical Social Worker met with patient to f/u after calling to remind him of paperwork at [employer] office. Pt reached at [phone]. He will come to office.   He did come later that morning to [employer], brought with him incomplete Patient Assistance Forms. We sat down and reviewed Patient Assistance forms for Xarelto and Jardiance. Discussed he would need to complete these and bring them back with proof of income (Administrator, Civil Service) so pt provider could complete their portion. Pt states understanding.   We then discussed the additional resources (food resources, CAFA and [employer]). Pt currently has pending Medicaid application, he again requests referral to [facility] despite me cautioning that they may not be able to help since he already receives Social Security retirement income and may need to go to Social Security directly. He signed release of information and I have sent referral to [facility].   The CAFA and Orange Card should be held on to until he hears back about Medicaid determination.   No additional questions at this time.   Patient is participating in a Managed Medicaid Plan:  no, self pay only  SDOH Screenings   Food Insecurity: No Food Insecurity (04/20/2023)  Housing: Low Risk  (04/20/2023)  Transportation Needs: No Transportation Needs (04/20/2023)  Utilities: Not At Risk (04/20/2023)  Financial Resource Strain: Low Risk  (04/20/2023)  Tobacco Use: Low Risk  (04/17/2023)  Health Literacy: Adequate Health Literacy (04/20/2023)    Octavio Graves, MSW, LCSW Clinical Social Worker II [facility] Health Heart/Vascular Care Navigation  [phone]- work cell phone (preferred) [phone]- desk phone

## 2023-04-22 NOTE — Progress Notes (Signed)
Heart and Vascular Care Navigation  04/20/2023  Caleb [name] 1960-09-14 [id number]  Reason for Referral:  Patient is participating in a Managed Medicaid Plan: No, self pay only, Medicaid pending  Engaged with patient by telephone for initial visit for Heart and Vascular Care Coordination.                                                                                                   Assessment:             LCSW was able to reach pt today at [phone]. Introduced self, role, reason for call. Confirmed home address, resides with his mother and is currently uninsured. He receives $1700 in social security (initially told me he has no income). He denies issues with housing, utilities etc. May be eligible for food assistance, but possibly over income, discussed [employer] assistance and food pantries. Has no issues with transportation. He had Medicaid in another state previously, is a legal permanent resident and has had [employer] help him apply with Medicaid, he does not have a disability application pending per notes b/c he told [employer] he would do that himself. He states he didn't say that and is asking for referral to [facility] to assist.   We discussed if not eligible for Medicaid we can assist with [employer] and [employer]. Encouraged him to complete medication applications given to him so we can also submit those. Pt requests any resources be left at desk at [facility] and I can assist with completing them. I am agreeable to assist. No additional questions at this time.                            HRT/VAS Care Coordination     Patients Home Cardiology Office [facility]   Outpatient Care Team Social Worker   Social Worker Name: Octavio Graves, [place], [phone]   Living arrangements for the past 2 months Single Family Home   Lives with: Parents  mother   Patient Current Insurance Coverage Self-Pay; Medicaid Pending   Patient  Has Concern With Paying Medical Bills Yes   Patient Concerns With Medical Bills medicaid pending- if not eligible may need additional options   Medical Bill Referrals: [facility] Financial Assistance;  Orange Card   Does Patient Have Prescription Coverage? No   Patient Prescription Assistance Programs Patient Assistance Programs   Home Assistive Devices/Equipment Scales       Social History:                                                                             SDOH Screenings   Food Insecurity: No Food Insecurity (04/20/2023)  Housing: Low Risk  (04/20/2023)  Transportation Needs: No Transportation Needs (  04/20/2023)  Utilities: Not At Risk (04/20/2023)  Financial Resource Strain: Low Risk  (04/20/2023)  Tobacco Use: Low Risk  (04/17/2023)  Health Literacy: Adequate Health Literacy (04/20/2023)    SDOH Interventions: Financial Resources:  Financial Strain Interventions: Other (Comment) (requests referral for disability, currently has pending Medicaid (unclear if long term or Emergency), will include SNAP application card with CAFA and Orange Card for pt to pick up, he will return apps for medication assistance) Editor, commissioning for [employer] and Social Security for Disability application assistance  Food Insecurity:  Food Insecurity Interventions: Other (Comment) (sending FNS card [employer] information centre manager)  Housing Insecurity:  Housing Interventions: Intervention Not Indicated  Transportation:   Transportation Interventions: Intervention Not Indicated    Other Care Navigation Interventions:     Provided Pharmacy assistance resources Patient Assistance Programs   Follow-up plan:   LCSW has left [employer] Release of Information, [employer] Assistance, [employer] and food resources at desk for pt. Encouraged him to complete the patient assistance forms for his medications and bring those back to the office when he comes tomorrow.

## 2023-04-30 ENCOUNTER — Telehealth: Payer: Self-pay | Admitting: Licensed Clinical Social Worker

## 2023-04-30 NOTE — Telephone Encounter (Signed)
H&V Care Navigation CSW Progress Note  Clinical Social Worker met with patient as a walk in - pt had not notified our team but we were able to met with her at this time. Pt has brought in copy of his current income and medication assistance forms. Pt has not completed them as highlighted, and did not have new updated manufacturers forms.   LCSW provided updated forms, pt signed where needed and left copy of his income. This was provided to [name], RN for provider to complete their portion.   Pt then let me know that he has not actually been staying with his mother, does use her address and get mail there. But has actually been staying in his car. We reviewed patient assistance packet with housing resources, he is amenable to me sending a referral to [employer] for f/u. I have done so and again showed him how to apply for SNAP using [employer] card. Pt also has the packet of food pantries. Pt and I also discussed that [place] housing authority has some waitlists currently available and pt agreeable to going there to apply for assistance.   I remain available will f/u with pt to see if he is able to speak with coordinated entry/[place] housing authority.   Patient is participating in a Managed Medicaid Plan:  No, self pay. Medicaid pending  SDOH Screenings   Food Insecurity: No Food Insecurity (04/20/2023)  Housing: Low Risk  (04/20/2023)  Transportation Needs: No Transportation Needs (04/20/2023)  Utilities: Not At Risk (04/20/2023)  Financial Resource Strain: Low Risk  (04/20/2023)  Tobacco Use: Low Risk  (04/17/2023)  Health Literacy: Adequate Health Literacy (04/20/2023)   Octavio Graves, MSW, LCSW Clinical Social Worker II [facility] Health Heart/Vascular Care Navigation  [phone]- work cell phone (preferred) [phone]- desk phone

## 2023-04-30 NOTE — Telephone Encounter (Addendum)
H&V Care Navigation CSW Progress Note  Clinical Social Worker  assisted pt with completing Xarelto Patient Assistance forms . I gave them to [name], RN, along with one copy of his Social Security income.  Patient is participating in a Managed Medicaid Plan:  No, self pay, Medicaid pending  SDOH Screenings   Food Insecurity: No Food Insecurity (04/20/2023)  Housing: Low Risk  (04/20/2023)  Transportation Needs: No Transportation Needs (04/20/2023)  Utilities: Not At Risk (04/20/2023)  Financial Resource Strain: Low Risk  (04/20/2023)  Tobacco Use: Low Risk  (04/17/2023)  Health Literacy: Adequate Health Literacy (04/20/2023)    Caleb [name], MSW, LCSW Clinical Social Worker II [facility] Health Heart/Vascular Care Navigation  [phone]- work cell phone (preferred) [phone]- desk phone

## 2023-04-30 NOTE — Telephone Encounter (Signed)
Faxed Pt assistance application for Xarelto

## 2023-04-30 NOTE — Telephone Encounter (Signed)
H&V Care Navigation CSW Progress Note  Clinical Social Worker assisted pt with completing Jardiance Patient Assistance forms. I gave them to [name], RN, along with one copy of his Social Security income.  Patient is participating in a Managed Medicaid Plan:  No, self pay, Medicaid pending  SDOH Screenings   Food Insecurity: No Food Insecurity (04/20/2023)  Housing: Low Risk  (04/20/2023)  Transportation Needs: No Transportation Needs (04/20/2023)  Utilities: Not At Risk (04/20/2023)  Financial Resource Strain: Low Risk  (04/20/2023)  Tobacco Use: Low Risk  (04/17/2023)  Health Literacy: Adequate Health Literacy (04/20/2023)    Octavio Graves, MSW, LCSW Clinical Social Worker II [facility] Health Heart/Vascular Care Navigation  [phone]- work cell phone (preferred) [phone]- desk phone

## 2023-04-30 NOTE — Telephone Encounter (Signed)
Faxed pt assistance application for [employer]

## 2023-05-04 ENCOUNTER — Telehealth: Payer: Self-pay | Admitting: Licensed Clinical Social Worker

## 2023-05-04 NOTE — Telephone Encounter (Signed)
H&V Care Navigation CSW Progress Note  Clinical Social Worker contacted patient by phone to f/u on housing resources provided when pt walked in to clinic last week. He had shared he uses his mom's address as current mailing address but stays in his car. LCSW had made referral to [employer]. Pt reached at [phone], has not reached out to [employer], isnt sure if they called him. I also had provided him [employer]'s information to speak with them about application. Encouraged him to go speak with them during hours listed on flyer. Pt states he will do so, just hasn't bc something "came up" last week. Will f/u after holiday to see if he has engaged any of these resources. Pt also received update from [facility] that he is unable to apply for disability at this time per his report. He is receiving social security retirement income already at this time.   Patient is participating in a Managed Medicaid Plan:  No, self pay- Medicaid pending  SDOH Screenings   Food Insecurity: Food Insecurity Present (05/04/2023)  Housing: Medium Risk (05/04/2023)  Transportation Needs: No Transportation Needs (04/20/2023)  Utilities: Not At Risk (04/20/2023)  Financial Resource Strain: Low Risk  (04/20/2023)  Tobacco Use: Low Risk  (04/17/2023)  Health Literacy: Adequate Health Literacy (04/20/2023)   Octavio Graves, MSW, LCSW Clinical Social Worker II [facility] Health Heart/Vascular Care Navigation  [phone]- work cell phone (preferred) [phone]- desk phone

## 2023-05-11 ENCOUNTER — Other Ambulatory Visit (HOSPITAL_COMMUNITY): Payer: Self-pay

## 2023-05-11 ENCOUNTER — Other Ambulatory Visit: Payer: Self-pay | Admitting: Cardiovascular Disease

## 2023-05-12 ENCOUNTER — Telehealth: Payer: Self-pay | Admitting: Licensed Clinical Social Worker

## 2023-05-12 ENCOUNTER — Other Ambulatory Visit (HOSPITAL_COMMUNITY): Payer: Self-pay

## 2023-05-12 NOTE — Telephone Encounter (Signed)
Faxed missing information to [employer]

## 2023-05-12 NOTE — Telephone Encounter (Signed)
H&V Care Navigation CSW Progress Note  Clinical Social Worker  completed chart review and noted  pt approved for Xarelto through [facility] ([employer]). When I called [employer] to confirm shipment they shared pt confirmed and meds shipped but need several pieces of application details completed. I took note of these and have sent them to Sandrea Hughs, Charity fundraiser. Once completed application can be re-sent to [employer].   Patient is participating in a Managed Medicaid Plan:  No, Medicaid pending.   SDOH Screenings   Food Insecurity: Food Insecurity Present (05/04/2023)  Housing: Medium Risk (05/04/2023)  Transportation Needs: No Transportation Needs (04/20/2023)  Utilities: Not At Risk (04/20/2023)  Financial Resource Strain: Low Risk  (04/20/2023)  Tobacco Use: Low Risk  (04/17/2023)  Health Literacy: Adequate Health Literacy (04/20/2023)    Caleb [name], MSW, LCSW Clinical Social Worker II [facility] Health Heart/Vascular Care Navigation  [phone]- work cell phone (preferred) [phone]- desk phone

## 2023-05-12 NOTE — Telephone Encounter (Signed)
H&V Care Navigation CSW Progress Note  Clinical Social Worker  spoke with pt who shares he has not received determination from [facility] yet and needs samples as he is out of Jardiance/left meds at a friend's home . Will route to triage for assistance with samples.   Patient is participating in a Managed Medicaid Plan:  No, Medicaid pending.   SDOH Screenings   Food Insecurity: Food Insecurity Present (05/04/2023)  Housing: Medium Risk (05/04/2023)  Transportation Needs: No Transportation Needs (04/20/2023)  Utilities: Not At Risk (04/20/2023)  Financial Resource Strain: Low Risk  (04/20/2023)  Tobacco Use: Low Risk  (04/17/2023)  Health Literacy: Adequate Health Literacy (04/20/2023)    Octavio Graves, MSW, LCSW Clinical Social Worker II [facility] Health Heart/Vascular Care Navigation  [phone]- work cell phone (preferred) [phone]- desk phone

## 2023-05-12 NOTE — Telephone Encounter (Signed)
H&V Care Navigation CSW Progress Note  Clinical Social Worker contacted patient by phone to f/u on referrals and resources provided to pt. Left voicemail which was returned by pt [phone]). Pt still staying in his car but per his report left his medications at a friends home and doesn't have them anymore so I assume he does have some place to go if needed. He has not contacted [employer]- again encouraged him to do so, so he can be appropriately screened for any of their resources. He verbalized that does not want shelter placement, I let pt know that is his choice. He has gone to [employer] and is missing some documents needed from [employer]. He then states that he has an appointment with [name] tomorrow at [facility] (previously had told me he was not eligible for assistance) and she will help him get these documents. Pt aware to call me if needed.   Since he left his medications at friends home and has been without them he has called pharmacy to re-order these early. He does not have any Jardiance, hasn't heard back from that assistance program with [facility] , states he has been receiving his Xarelto which was approved per media tab and confirmed with [employer] Patient Assistance (per rep some items missing this has been communicated to [name], Charity fundraiser).   Patient is participating in a Managed Medicaid Plan:  No, Medicaid pending.   SDOH Screenings   Food Insecurity: Food Insecurity Present (05/04/2023)  Housing: Medium Risk (05/04/2023)  Transportation Needs: No Transportation Needs (04/20/2023)  Utilities: Not At Risk (04/20/2023)  Financial Resource Strain: Low Risk  (04/20/2023)  Tobacco Use: Low Risk  (04/17/2023)  Health Literacy: Adequate Health Literacy (04/20/2023)    Octavio Graves, MSW, LCSW Clinical Social Worker II [facility] Health Heart/Vascular Care Navigation  [phone]- work cell phone (preferred) [phone]- desk phone

## 2023-05-13 NOTE — Telephone Encounter (Signed)
Called [employer], confirmed pt application still pending. Note RN has left vm regarding samples.   Caleb [name], MSW, LCSW Clinical Social Worker II [facility] Navigation  [phone]- work cell phone (preferred) [phone]- desk phone

## 2023-05-13 NOTE — Telephone Encounter (Signed)
 Called patient with no answer. Left message to return call.

## 2023-05-14 ENCOUNTER — Other Ambulatory Visit (HOSPITAL_COMMUNITY): Payer: Self-pay

## 2023-05-14 ENCOUNTER — Other Ambulatory Visit: Payer: Self-pay

## 2023-05-14 MED ORDER — ROSUVASTATIN CALCIUM 20 MG PO TABS
20.0000 mg | ORAL_TABLET | Freq: Every day | ORAL | 3 refills | Status: DC
  Filled 2023-05-14 (×2): qty 90, 90d supply, fill #0
  Filled 2023-08-21: qty 90, 90d supply, fill #1

## 2023-05-14 MED ORDER — TORSEMIDE 20 MG PO TABS
20.0000 mg | ORAL_TABLET | Freq: Every day | ORAL | 3 refills | Status: DC
  Filled 2023-05-14 (×2): qty 90, 90d supply, fill #0
  Filled 2023-08-21: qty 90, 90d supply, fill #1
  Filled 2023-11-20: qty 90, 90d supply, fill #2
  Filled 2024-02-19: qty 90, 90d supply, fill #3

## 2023-05-14 MED ORDER — ASPIRIN 81 MG PO CHEW
81.0000 mg | CHEWABLE_TABLET | Freq: Every day | ORAL | 11 refills | Status: DC
Start: 2023-05-14 — End: 2023-10-21
  Filled 2023-05-14 (×2): qty 30, 30d supply, fill #0
  Filled 2023-08-21: qty 30, 30d supply, fill #1

## 2023-05-14 MED ORDER — METOPROLOL SUCCINATE ER 50 MG PO TB24
50.0000 mg | ORAL_TABLET | Freq: Every day | ORAL | 3 refills | Status: DC
  Filled 2023-05-14 (×2): qty 90, 90d supply, fill #0

## 2023-05-14 MED ORDER — POTASSIUM CHLORIDE CRYS ER 20 MEQ PO TBCR
20.0000 meq | EXTENDED_RELEASE_TABLET | Freq: Every day | ORAL | 3 refills | Status: DC
  Filled 2023-05-14 (×2): qty 90, 90d supply, fill #0
  Filled 2023-08-21: qty 90, 90d supply, fill #1
  Filled 2024-02-19: qty 90, 90d supply, fill #2

## 2023-05-15 ENCOUNTER — Other Ambulatory Visit (HOSPITAL_COMMUNITY): Payer: Self-pay

## 2023-05-15 ENCOUNTER — Other Ambulatory Visit (HOSPITAL_BASED_OUTPATIENT_CLINIC_OR_DEPARTMENT_OTHER): Payer: Self-pay

## 2023-05-15 ENCOUNTER — Other Ambulatory Visit: Payer: Self-pay

## 2023-05-18 ENCOUNTER — Other Ambulatory Visit: Payer: Self-pay

## 2023-05-18 ENCOUNTER — Telehealth: Payer: Self-pay | Admitting: Licensed Clinical Social Worker

## 2023-05-18 NOTE — Telephone Encounter (Signed)
H&V Care Navigation CSW Progress Note  Clinical Social Worker  received the following email  to f/u with me from [name]- DAP disability specialist.   "He was very short with me the first time we spoke to schedule his phone interview. He was nicer during our phone interview. He said he needs "paperwork for the [place] [employer]" for housing and I said I would check back with you. I'm awaiting his signed paperwork and will then file his SSDI/SSI claims with Social Security. He is already receiving $1,701/month in early retirement so I'm not sure what else I can do."  I let Lawson Fiscal know that I was aware pt missing paperwork and that this is his birth certificate as he was born outside the [place]. Provided him with [employer] walk in hours to assist with obtaining that or referral to appropriate agency.   Patient is participating in a Managed Medicaid Plan:  No, Medicaid pending.   SDOH Screenings   Food Insecurity: Food Insecurity Present (05/04/2023)  Housing: Medium Risk (05/04/2023)  Transportation Needs: No Transportation Needs (04/20/2023)  Utilities: Not At Risk (04/20/2023)  Financial Resource Strain: Low Risk  (04/20/2023)  Tobacco Use: Low Risk  (04/17/2023)  Health Literacy: Adequate Health Literacy (04/20/2023)    Octavio Graves, MSW, LCSW Clinical Social Worker II [facility] Health Heart/Vascular Care Navigation  [phone]- work cell phone (preferred) [phone]- desk phone

## 2023-05-18 NOTE — Telephone Encounter (Signed)
H&V Care Navigation CSW Progress Note  Clinical Social Worker contacted patient by phone to f/u on community resources and medications. Pt reached at [phone]. He again confirms he received Xarelto but not Jardiance. I called [employer] and it is still pending. If pt needs samples he should contact our triage team back as they left him a vociemail last week. Pt requests I send him this number.   Pt did attend appt with Lawson Fiscal, DAP Disability case worker at [facility] last week. He has spoken with [employer] and [employer]. This assistance has been complicated by the fact that he may need his birth certificate for some applications. He does not have this. I shared that [employer] immigrant assistance center may be able to get that for him as he was not born in the [place]. If he needs an ID they can assist with that as well and if pt needs new document with social security number he will need to go to social security office to apply for that (he has gotten Nurse, children[employer] for applications so is aware of where that is). He said Lawson Fiscal was going to reach out to me from [facility] about this as well. I have sent her an email.   I texted pt as per his request the number for samples (main Heartcare line), and the address and walk in hours for [employer].   Patient is participating in a Managed Medicaid Plan:  No, Medicaid pending  SDOH Screenings   Food Insecurity: Food Insecurity Present (05/04/2023)  Housing: Medium Risk (05/04/2023)  Transportation Needs: No Transportation Needs (04/20/2023)  Utilities: Not At Risk (04/20/2023)  Financial Resource Strain: Low Risk  (04/20/2023)  Tobacco Use: Low Risk  (04/17/2023)  Health Literacy: Adequate Health Literacy (04/20/2023)     Octavio Graves, MSW, LCSW Clinical Social Worker II [facility] Health Heart/Vascular Care Navigation  [phone]- work cell phone  (preferred) [phone]- desk phone

## 2023-05-20 ENCOUNTER — Emergency Department (HOSPITAL_COMMUNITY)
Admission: EM | Admit: 2023-05-20 | Discharge: 2023-05-20 | Disposition: A | Discharge status: Home / Self Care | Payer: No Typology Code available for payment source

## 2023-05-20 ENCOUNTER — Encounter (HOSPITAL_COMMUNITY): Payer: Self-pay

## 2023-05-20 ENCOUNTER — Other Ambulatory Visit: Payer: Self-pay

## 2023-05-20 DIAGNOSIS — L03115 Cellulitis of right lower limb: Secondary | ICD-10-CM | POA: Insufficient documentation

## 2023-05-20 DIAGNOSIS — Z7901 Long term (current) use of anticoagulants: Secondary | ICD-10-CM | POA: Insufficient documentation

## 2023-05-20 DIAGNOSIS — I509 Heart failure, unspecified: Secondary | ICD-10-CM | POA: Insufficient documentation

## 2023-05-20 DIAGNOSIS — T148XXA Other injury of unspecified body region, initial encounter: Secondary | ICD-10-CM

## 2023-05-20 DIAGNOSIS — Z23 Encounter for immunization: Secondary | ICD-10-CM | POA: Insufficient documentation

## 2023-05-20 DIAGNOSIS — M7981 Nontraumatic hematoma of soft tissue: Secondary | ICD-10-CM | POA: Insufficient documentation

## 2023-05-20 MED ORDER — TETANUS-DIPHTH-ACELL PERTUSSIS 5-2.5-18.5 LF-MCG/0.5 IM SUSY
0.5000 mL | PREFILLED_SYRINGE | Freq: Once | INTRAMUSCULAR | Status: AC
  Administered 2023-05-20: 0.5 mL via INTRAMUSCULAR
  Filled 2023-05-20: qty 0.5

## 2023-05-20 MED ORDER — DOXYCYCLINE HYCLATE 100 MG PO CAPS: 100.0000 mg | ORAL_CAPSULE | Freq: Two times a day (BID) | ORAL | 0 refills | Status: DC

## 2023-05-20 MED ORDER — LIDOCAINE-EPINEPHRINE 2 %-1:100000 IJ SOLN
5.0000 mL | Freq: Once | INTRAMUSCULAR | Status: AC
  Administered 2023-05-20: 5 mL
  Filled 2023-05-20: qty 1

## 2023-05-20 MED ORDER — DOXYCYCLINE HYCLATE 100 MG PO TABS
100.0000 mg | ORAL_TABLET | Freq: Once | ORAL | Status: AC
  Administered 2023-05-20: 100 mg via ORAL
  Filled 2023-05-20: qty 1

## 2023-05-20 NOTE — ED Notes (Signed)
I&D tray and lidocaine to bedside

## 2023-05-20 NOTE — ED Provider Notes (Signed)
[place] EMERGENCY DEPARTMENT AT [facility] Provider Note   CSN: [id number] Arrival date & time: 05/20/23  7425     History  Chief Complaint  Patient presents with   Insect Bite    Caleb [name] is a 62 y.o. male, hx of CHF, CVA, AAA, who presents to the ED 2/2 to possible spider bite.  He states that he thinks he may have been bitten by spider, because he had a lump on his right lower leg, yesterday, has gotten larger and larger.  He states is very painful, and that now there is redness spreading.  He notes that it has gotten worse, and he is concerned.  Denies any fevers or chills.  Is on Xarelto. Home Medications Prior to Admission medications   Medication Sig Start Date End Date Taking? Authorizing Provider  doxycycline (VIBRAMYCIN) 100 MG capsule Take 1 capsule (100 mg total) by mouth 2 (two) times daily. 05/20/23  Yes Oswin Griffith L, PA  aspirin (ASPIRIN LOW DOSE) 81 MG chewable tablet Chew 1 tablet (81 mg total) by mouth daily. 05/14/23   Croitoru, Mihai, MD  empagliflozin (JARDIANCE) 10 MG TABS tablet Take 1 tablet (10 mg total) by mouth daily. 04/13/23   Croitoru, Mihai, MD  metoprolol succinate (TOPROL-XL) 50 MG 24 hr tablet Take 1 tablet (50 mg total) by mouth daily. Take with or immediately following a meal. 05/14/23   Croitoru, Mihai, MD  potassium chloride SA (KLOR-CON M) 20 MEQ tablet Take 1 tablet (20 mEq total) by mouth daily. 05/14/23   Croitoru, Mihai, MD  rivaroxaban (XARELTO) 20 MG TABS tablet Take 1 tablet (20 mg total) by mouth daily with supper. 04/13/23   Croitoru, Mihai, MD  rivaroxaban (XARELTO) 20 MG TABS tablet Take 1 tablet (20 mg total) by mouth daily with supper. 04/17/23   Croitoru, Mihai, MD  rosuvastatin (CRESTOR) 20 MG tablet Take 1 tablet (20 mg total) by mouth daily. 05/14/23   Croitoru, Mihai, MD  torsemide (DEMADEX) 20 MG tablet Take 1 tablet (20 mg total) by mouth daily. 05/14/23   Croitoru, Rachelle Hora, MD      Allergies    Patient has no  known allergies.    Review of Systems   Review of Systems  Constitutional:  Negative for fever.  Skin:  Positive for rash.    Physical Exam Updated Vital Signs BP (!) 134/99 (BP Location: Left Arm)   Pulse 70   Temp 98.5 F (36.9 C) (Oral)   Resp 18   Ht 6\' 3"  (1.905 m)   Wt 108.9 kg   SpO2 93%   BMI 30.00 kg/m  Physical Exam Vitals and nursing note reviewed.  Constitutional:      General: He is not in acute distress.    Appearance: He is well-developed.  HENT:     Head: Normocephalic and atraumatic.  Eyes:     Conjunctiva/sclera: Conjunctivae normal.  Cardiovascular:     Rate and Rhythm: Normal rate and regular rhythm.     Heart sounds: No murmur heard. Pulmonary:     Effort: Pulmonary effort is normal. No respiratory distress.     Breath sounds: Normal breath sounds.  Abdominal:     Palpations: Abdomen is soft.     Tenderness: There is no abdominal tenderness.  Musculoskeletal:        General: No swelling.     Cervical back: Neck supple.  Skin:    General: Skin is warm and dry.     Capillary Refill:  Capillary refill takes less than 2 seconds.     Comments: +indurated mass to RLE (2x2cm) w/overlying erythema and ecchymoses  Neurological:     Mental Status: He is alert.  Psychiatric:        Mood and Affect: Mood normal.     ED Results / Procedures / Treatments   Labs (all labs ordered are listed, but only abnormal results are displayed) Labs Reviewed - No data to display  EKG None  Radiology No results found.  Procedures .Ultrasound ED Soft Tissue  Date/Time: 05/20/2023 7:17 AM  Performed by: Pete Pelt, PA Authorized by: Pete Pelt, PA   Procedure details:    Indications: localization of abscess     Transverse view:  Visualized   Longitudinal view:  Visualized   Images: not archived   Location:    Location: lower extremity     Side:  Left Findings:     abscess present Comments:     2x2cm loculated mass on LLE .Incision and  Drainage  Date/Time: 05/20/2023 7:56 AM  Performed by: Pete Pelt, PA Authorized by: Pete Pelt, PA   Consent:    Consent obtained:  Verbal   Consent given by:  Patient   Risks, benefits, and alternatives were discussed: yes     Risks discussed:  Bleeding, damage to other organs, infection, incomplete drainage and pain   Alternatives discussed:  No treatment Universal protocol:    Patient identity confirmed:  Verbally with patient Location:    Type:  Hematoma   Size:  2x2   Location:  Lower extremity   Lower extremity location:  Leg   Leg location:  R lower leg Pre-procedure details:    Skin preparation:  Chlorhexidine Sedation:    Sedation type:  None Anesthesia:    Anesthesia method:  Local infiltration   Local anesthetic:  Lidocaine 2% WITH epi Procedure type:    Complexity:  Simple Procedure details:    Incision types:  Stab incision   Drainage:  Bloody   Wound treatment:  Wound left open   Packing materials:  None     Medications Ordered in ED Medications  Tdap (BOOSTRIX) injection 0.5 mL (has no administration in time range)  lidocaine-EPINEPHrine (XYLOCAINE W/EPI) 2 %-1:100000 (with pres) injection 5 mL (5 mLs Infiltration Given 05/20/23 0758)  doxycycline (VIBRA-TABS) tablet 100 mg (100 mg Oral Given 05/20/23 0757)    ED Course/ Medical Decision Making/ A&P                                 Medical Decision Making Patient has a 2 cm x 2 cm mass, on the left lower extremity, with surrounding erythema, it is indurated, possible developing abscess, versus hematoma, he is requesting I&D, we discussed that this may be a hematoma, and blood just may come out, and he is an increased risk of bleeding, given his history of Xarelto use.  He would like to proceed with I&D, he understands risk associated with this.  He has overlying erythema, which makes this suspicious for overlying cellulitis.  Amount and/or Complexity of Data Reviewed Discussion of  management or test interpretation with external provider(s): Area opened up with incision and drainage, turns out to be a hematoma, drained for patient's comfort, placed ABD pad, given history of Xarelto, on patient.  No bleeding through.  Updated patient's tetanus, given possible bite.  He is not sure when his last  tetanus was.  Additionally started on doxycycline, for possible overlying cellulitis.  Is possible that he was bit, and then developed a hematoma, from the pressure of the bite, and then had overlying cellulitis.  Will treat for both.  We discussed return precautions and he voiced understanding was discharged home  Risk Prescription drug management.   Final Clinical Impression(s) / ED Diagnoses Final diagnoses:  Cellulitis of right lower extremity  Hematoma    Rx / DC Orders ED Discharge Orders          Ordered    doxycycline (VIBRAMYCIN) 100 MG capsule  2 times daily        05/20/23 0754              Alieu Finnigan Elbert Ewings, PA 05/20/23 0759    Coral Spikes, DO 05/20/23 1534

## 2023-05-20 NOTE — ED Triage Notes (Signed)
Pt states he was bitten by a spider yesterday on his right lower leg. Pt has obvious swelling and erythema of right lower leg with darkened area at bite site.

## 2023-05-20 NOTE — Discharge Instructions (Signed)
You had a hematoma that we attempted to drain Caleb [name] initially thought it was an abscess given the overlying cellulitis.  They can use it is just a large bruise.  But it should feel better after draining it.  Return to ER if you have any kind of increased redness, swelling, pain, loss of sensation in your foot.  Make sure you change the dressing once daily, and check and make sure there is no redness, swelling, that is worsening.  Please follow-up with PCP.

## 2023-05-22 ENCOUNTER — Emergency Department (HOSPITAL_COMMUNITY)
Admission: EM | Admit: 2023-05-22 | Discharge: 2023-05-22 | Discharge status: Against Medical Advice | Payer: MEDICAID | Source: Home / Self Care

## 2023-05-22 ENCOUNTER — Emergency Department (HOSPITAL_COMMUNITY)
Admission: EM | Admit: 2023-05-22 | Discharge: 2023-05-22 | Disposition: A | Discharge status: Home / Self Care | Payer: Self-pay | Attending: Emergency Medicine | Admitting: Emergency Medicine

## 2023-05-22 DIAGNOSIS — Z79899 Other long term (current) drug therapy: Secondary | ICD-10-CM | POA: Insufficient documentation

## 2023-05-22 DIAGNOSIS — I110 Hypertensive heart disease with heart failure: Secondary | ICD-10-CM | POA: Insufficient documentation

## 2023-05-22 DIAGNOSIS — W228XXA Striking against or struck by other objects, initial encounter: Secondary | ICD-10-CM | POA: Insufficient documentation

## 2023-05-22 DIAGNOSIS — I509 Heart failure, unspecified: Secondary | ICD-10-CM | POA: Insufficient documentation

## 2023-05-22 DIAGNOSIS — T148XXA Other injury of unspecified body region, initial encounter: Secondary | ICD-10-CM

## 2023-05-22 DIAGNOSIS — Z7901 Long term (current) use of anticoagulants: Secondary | ICD-10-CM | POA: Insufficient documentation

## 2023-05-22 DIAGNOSIS — Z7982 Long term (current) use of aspirin: Secondary | ICD-10-CM | POA: Insufficient documentation

## 2023-05-22 DIAGNOSIS — Z8673 Personal history of transient ischemic attack (TIA), and cerebral infarction without residual deficits: Secondary | ICD-10-CM | POA: Insufficient documentation

## 2023-05-22 DIAGNOSIS — S81811A Laceration without foreign body, right lower leg, initial encounter: Secondary | ICD-10-CM | POA: Insufficient documentation

## 2023-05-22 NOTE — ED Provider Notes (Signed)
[place] EMERGENCY DEPARTMENT AT [facility] Provider Note   CSN: [id number] Arrival date & time: 05/22/23  1534     History  Chief Complaint  Patient presents with   Laceration    Caleb [name] is a 62 y.o. male with past medical history significant for hypertension, stroke, CHF, chronic Xarelto use presents to the ED complaining of bleeding into his right lower extremity.  Patient was seen a couple of days ago in the ED and had a procedure done.  He reports that when he took the bandage off this morning it became bleeding.  Patient came to the ED earlier, but the bleeding stopped spontaneous.  He returned to the ED when the bleeding began again.  Denies any new injury to the area.      Home Medications Prior to Admission medications   Medication Sig Start Date End Date Taking? Authorizing Provider  aspirin (ASPIRIN LOW DOSE) 81 MG chewable tablet Chew 1 tablet (81 mg total) by mouth daily. 05/14/23   Croitoru, Mihai, MD  doxycycline (VIBRAMYCIN) 100 MG capsule Take 1 capsule (100 mg total) by mouth 2 (two) times daily. 05/20/23   Small, Brooke L, PA  empagliflozin (JARDIANCE) 10 MG TABS tablet Take 1 tablet (10 mg total) by mouth daily. 04/13/23   Croitoru, Mihai, MD  metoprolol succinate (TOPROL-XL) 50 MG 24 hr tablet Take 1 tablet (50 mg total) by mouth daily. Take with or immediately following a meal. 05/14/23   Croitoru, Mihai, MD  potassium chloride SA (KLOR-CON M) 20 MEQ tablet Take 1 tablet (20 mEq total) by mouth daily. 05/14/23   Croitoru, Mihai, MD  rivaroxaban (XARELTO) 20 MG TABS tablet Take 1 tablet (20 mg total) by mouth daily with supper. 04/13/23   Croitoru, Mihai, MD  rivaroxaban (XARELTO) 20 MG TABS tablet Take 1 tablet (20 mg total) by mouth daily with supper. 04/17/23   Croitoru, Mihai, MD  rosuvastatin (CRESTOR) 20 MG tablet Take 1 tablet (20 mg total) by mouth daily. 05/14/23   Croitoru, Mihai, MD  torsemide (DEMADEX) 20 MG tablet Take 1 tablet (20 mg  total) by mouth daily. 05/14/23   Croitoru, Rachelle Hora, MD      Allergies    Patient has no known allergies.    Review of Systems   Review of Systems  Skin:  Positive for wound.    Physical Exam Updated Vital Signs BP 116/87 (BP Location: Left Arm)   Pulse 64   Temp 97.8 F (36.6 C) (Oral)   Resp 18   Ht 6\' 3"  (1.905 m)   Wt 108 kg   SpO2 99%   BMI 29.76 kg/m  Physical Exam Vitals and nursing note reviewed.  Constitutional:      General: He is not in acute distress.    Appearance: Normal appearance. He is not ill-appearing or diaphoretic.  Cardiovascular:     Rate and Rhythm: Normal rate and regular rhythm.  Pulmonary:     Effort: Pulmonary effort is normal.  Musculoskeletal:       Legs:  Skin:    General: Skin is warm and dry.     Capillary Refill: Capillary refill takes less than 2 seconds.  Neurological:     Mental Status: He is alert. Mental status is at baseline.  Psychiatric:        Mood and Affect: Mood normal.        Behavior: Behavior normal.     ED Results / Procedures / Treatments   Labs (  all labs ordered are listed, but only abnormal results are displayed) Labs Reviewed - No data to display  EKG None  Radiology No results found.  Procedures Procedures    Medications Ordered in ED Medications - No data to display  ED Course/ Medical Decision Making/ A&P                                 Medical Decision Making  This patient presents to the ED with chief complaint(s) of bleeding wound with pertinent past medical history of chronic Xarelto use.  The complaint involves an extensive differential diagnosis and also carries with it a high risk of complications and morbidity.    Initial Assessment:   Exam significant for a moderate mount of dried blood to the medial right ankle/lower leg.  There is no evidence of active bleeding at this time.  There is not appear to be a significant amount of swelling, erythema, overlying skin changes, or new  wound.  Treatment and Reassessment: Area was thoroughly cleansed and a new pressure bandage was placed.  A nonadherent bandage was used this time.  Advised patient to keep this on for the next 48 hours before removing.  Disposition:   Discussed continued wound care with patient.  Advised patient that should he experience any temperature/color changes to his right foot to return to the ED for a bandage adjustment.  The patient has been appropriately medically screened and/or stabilized in the ED. I have low suspicion for any other emergent medical condition which would require further screening, evaluation or treatment in the ED or require inpatient management. At time of discharge the patient is hemodynamically stable and in no acute distress. I have discussed work-up results and diagnosis with patient and answered all questions. Patient is agreeable with discharge plan. We discussed strict return precautions for returning to the emergency department and they verbalized understanding.            Final Clinical Impression(s) / ED Diagnoses Final diagnoses:  Bleeding from wound    Rx / DC Orders ED Discharge Orders     None         Lenard Simmer, PA-C 05/22/23 Donavan Burnet, MD 05/22/23 1919

## 2023-05-22 NOTE — ED Triage Notes (Signed)
Pt reports laceration to RLE from hitting a hedge a few days ago. Pt started again today, controlled at this time. Pt is on blood thinner

## 2023-05-22 NOTE — ED Provider Triage Note (Signed)
Emergency Medicine Provider Triage Evaluation Note  Caleb [name] , a 62 y.o. male  was evaluated in triage.  Pt complains of bleeding to his right lower extremity.  Patient had this area cut open on 05/20/23.  He takes Xarelto.  He took bandage off this morning and bleeding restarted.    Review of Systems  Positive: As above Negative: As above  Physical Exam  BP 116/87 (BP Location: Left Arm)   Pulse 64   Temp 97.8 F (36.6 C) (Oral)   Resp 18   Ht 6\' 3"  (1.905 m)   Wt 108 kg   SpO2 99%   BMI 29.76 kg/m  Gen:   Awake, no distress   Resp:  Normal effort  MSK:   Moves extremities without difficulty  Other:    Medical Decision Making  Medically screening exam initiated at 4:42 PM.  Appropriate orders placed.  Caleb [name] was informed that the remainder of the evaluation will be completed by another provider, this initial triage assessment does not replace that evaluation, and the importance of remaining in the ED until their evaluation is complete.     Caleb Simmer, PA-C 05/22/23 [phone]

## 2023-05-25 NOTE — Telephone Encounter (Signed)
H&V Care Navigation CSW Progress Note  Clinical Social Worker called [facility], confirmed pt application still pending. Note RN has left vm regarding samples. Pt was encouraged to call them back but has not done so. Per patient assistance program they should have application processed this week.    Patient is participating in a Managed Medicaid Plan:  No, Medicaid pending  SDOH Screenings   Food Insecurity: Food Insecurity Present (05/04/2023)  Housing: Medium Risk (05/04/2023)  Transportation Needs: No Transportation Needs (04/20/2023)  Utilities: Not At Risk (04/20/2023)  Financial Resource Strain: Low Risk  (04/20/2023)  Tobacco Use: Low Risk  (05/20/2023)  Health Literacy: Adequate Health Literacy (04/20/2023)    Caleb [name], MSW, LCSW Clinical Social Worker II [facility] Health Heart/Vascular Care Navigation  [phone]- work cell phone (preferred) [phone]- desk phone

## 2023-05-29 ENCOUNTER — Telehealth (HOSPITAL_BASED_OUTPATIENT_CLINIC_OR_DEPARTMENT_OTHER): Payer: Self-pay | Admitting: Licensed Clinical Social Worker

## 2023-05-29 NOTE — Telephone Encounter (Signed)
H&V Care Navigation CSW Progress Note  Clinical Social Worker called [facility], confirmed pt application approved shipment should be sent in 5-7 days. Note RN has left vm regarding samples. Still no f/u from pt and no call back to me.   Patient is participating in a Managed Medicaid Plan:  No, Medicaid pending    SDOH Screenings   Food Insecurity: Food Insecurity Present (05/04/2023)  Housing: Medium Risk (05/04/2023)  Transportation Needs: No Transportation Needs (04/20/2023)  Utilities: Not At Risk (04/20/2023)  Financial Resource Strain: Low Risk  (04/20/2023)  Tobacco Use: Low Risk  (05/20/2023)  Health Literacy: Adequate Health Literacy (04/20/2023)     Octavio Graves, MSW, LCSW Clinical Social Worker II [facility] Health Heart/Vascular Care Navigation  [phone]- work cell phone (preferred) [phone]- desk phone

## 2023-05-29 NOTE — Telephone Encounter (Signed)
H&V Care Navigation CSW Progress Note  Clinical Social Worker contacted patient by phone, reached him at [phone], to f/u on approval from [facility]- medications should arrive to his mother's home in the next 5-7 business days. He also shares he has called [facility], did not share when asked if paperwork was received or not. Remain available, encouraged pt to call me as needed.  Patient is participating in a Managed Medicaid Plan:  no, Medicaid pending  SDOH Screenings   Food Insecurity: Food Insecurity Present (05/04/2023)  Housing: Medium Risk (05/04/2023)  Transportation Needs: No Transportation Needs (04/20/2023)  Utilities: Not At Risk (04/20/2023)  Financial Resource Strain: Low Risk  (04/20/2023)  Tobacco Use: Low Risk  (05/20/2023)  Health Literacy: Adequate Health Literacy (04/20/2023)    Caleb [name], MSW, LCSW Clinical Social Worker II [facility] Health Heart/Vascular Care Navigation  [phone]- work cell phone (preferred) [phone]- desk phone

## 2023-05-29 NOTE — Telephone Encounter (Signed)
H&V Care Navigation CSW Progress Note  Clinical Social Worker  received the following text from pt .  "Caleb [name] I keep calling that center back to follow up on the housing situation but everytime I call their voicemail is always full. Every person I called in there. Their voicemail is full so I wasn't able to leave a voicemail. I dont know what to do but I'm hoping they call back.   I responded the following: " Can you let me know what number exactly you're using? I will send them a message to try and have them follow up. Cannot guarantee a call before the weekend but I can try. [employer]: [phone]"  I then called [employer] and was able to reach non full voicemail at [phone]. I then sent the pt the following: "I just called and the voicemail is not full!"  I also attempted [facility] and their voicemail was also not full.  Sent pt the following:  "If you are referring to [facility] they are managing your disability application. I just called them as well and their voice mails are open as well. You should select extension [phone] for [name] at [phone].  [place] housing authority takes applications in person only which may be why you can't leave a message with them.  Hope this helps!"   I have not received a return call or text at this time.    Patient is participating in a Managed Medicaid Plan:  No, self pay only, Medicaid only  SDOH Screenings   Food Insecurity: Food Insecurity Present (05/04/2023)  Housing: Medium Risk (05/04/2023)  Transportation Needs: No Transportation Needs (04/20/2023)  Utilities: Not At Risk (04/20/2023)  Financial Resource Strain: Low Risk  (04/20/2023)  Tobacco Use: Low Risk  (05/20/2023)  Health Literacy: Adequate Health Literacy (04/20/2023)    Octavio Graves, MSW, LCSW Clinical Social Worker II [facility] Health Heart/Vascular Care Navigation  [phone]- work cell phone (preferred) [phone]- desk phone

## 2023-06-12 ENCOUNTER — Telehealth (HOSPITAL_BASED_OUTPATIENT_CLINIC_OR_DEPARTMENT_OTHER): Payer: Self-pay | Admitting: Licensed Clinical Social Worker

## 2023-06-12 NOTE — Telephone Encounter (Signed)
 H&V Care Navigation CSW Progress Note  Clinical Social Worker  received a email from [facility] caseworker Lori Sambol to provide me an update regarding pt disability case .   I am working on [employer] claim right now. His SSDI/SSI claims are a little complicated because he is already collecting early retirement from [employer]. This negates me from filing his SSDI claim online. I just need to file what's known as an "all paper" application. I will mail it to the [employer] office by [employer] (same as all the others).  LCSW remains available as needed.   Patient is participating in a Managed Medicaid Plan:  No, per account notes Medicaid pending still  SDOH Screenings   Food Insecurity: Food Insecurity Present (05/04/2023)  Housing: Medium Risk (05/04/2023)  Transportation Needs: No Transportation Needs (04/20/2023)  Utilities: Not At Risk (04/20/2023)  Financial Resource Strain: Low Risk  (04/20/2023)  Tobacco Use: Low Risk  (05/20/2023)  Health Literacy: Adequate Health Literacy (04/20/2023)    Marit Lark, MSW, LCSW Clinical Social Worker II [facility] Health Heart/Vascular Care Navigation  [phone]- work cell phone (preferred) [phone]- desk phone
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
## 2023-06-17 ENCOUNTER — Ambulatory Visit: Payer: MEDICAID | Attending: General Practice | Admitting: General Practice

## 2023-06-17 ENCOUNTER — Encounter: Payer: Self-pay | Admitting: General Practice

## 2023-06-17 VITALS — BP 110/60 | HR 81 | Ht 75.0 in | Wt 262.0 lb

## 2023-06-17 DIAGNOSIS — I5020 Unspecified systolic (congestive) heart failure: Secondary | ICD-10-CM

## 2023-06-17 DIAGNOSIS — I4891 Unspecified atrial fibrillation: Secondary | ICD-10-CM

## 2023-06-17 DIAGNOSIS — I10 Essential (primary) hypertension: Secondary | ICD-10-CM

## 2023-06-17 NOTE — Patient Instructions (Signed)
 Medication Instructions:  CALL US  IN 1 MONTH AFTER STARTING JARDIANCE  TO DISCUSS ULTRASOUND *If you need a refill on your cardiac medications before your next appointment, please call your pharmacy*  Lab Work: NONE  Other Instructions SEE PRIMARY CARE LIST  GO TO THE URGENT CARE=AS DISCUSSED MAINTAIN YOUR PHYSICAL ACTIVITY PLEASE READ AND FOLLOW ATTACHED  SALTY 6  Follow-Up: At [facility], you and your health needs are our priority.  As part of our continuing mission to provide you with exceptional heart care, we have created designated Provider Care Teams.  These Care Teams include your primary Cardiologist (physician) and Advanced Practice Providers (APPs -  Physician Assistants and Nurse Practitioners) who all work together to provide you with the care you need, when you need it.  We recommend signing up for the patient portal called MyChart.  Sign up information is provided on this After Visit Summary.  MyChart is used to connect with patients for Virtual Visits (Telemedicine).  Patients are able to view lab/test results, encounter notes, upcoming appointments, etc.  Non-urgent messages can be sent to your provider as well.   To learn more about what you can do with MyChart, go to [email or web address].    Your next appointment:   4-6 month(s)  Provider:   Jerel Balding, MD  or Josefa Beauvais, FNP

## 2023-06-22 ENCOUNTER — Telehealth: Payer: Self-pay | Admitting: Licensed Clinical Social Worker

## 2023-06-22 NOTE — Telephone Encounter (Signed)
 H&V Care Navigation CSW Progress Note  Clinical Social Worker contacted patient by phone to f/u on text message received. Pt asked for number for food and nutrition services at [employer] to apply for benefits. I texted pt a picture of the card with all information and the stand alone number to [phone].  Patient is participating in a Managed Medicaid Plan:  No, self pay only, Medicaid pending  SDOH Screenings   Food Insecurity: Food Insecurity Present (05/04/2023)  Housing: Medium Risk (05/04/2023)  Transportation Needs: No Transportation Needs (04/20/2023)  Utilities: Not At Risk (04/20/2023)  Financial Resource Strain: Low Risk  (04/20/2023)  Tobacco Use: Low Risk  (06/17/2023)  Health Literacy: Adequate Health Literacy (04/20/2023)   Marit Lark, MSW, LCSW Clinical Social Worker II [facility] Heart/Vascular Care Navigation  [phone]- work cell phone (preferred) [phone]- desk phone

## 2023-06-29 ENCOUNTER — Observation Stay (HOSPITAL_COMMUNITY)
Admission: EM | Admit: 2023-06-29 | Discharge: 2023-07-01 | Discharge status: Against Medical Advice | Payer: MEDICAID | Attending: Family Medicine | Admitting: Family Medicine

## 2023-06-29 ENCOUNTER — Encounter (HOSPITAL_COMMUNITY): Payer: Self-pay

## 2023-06-29 ENCOUNTER — Other Ambulatory Visit: Payer: Self-pay

## 2023-06-29 DIAGNOSIS — S31149A Puncture wound of abdominal wall with foreign body, unspecified quadrant without penetration into peritoneal cavity, initial encounter: Principal | ICD-10-CM | POA: Insufficient documentation

## 2023-06-29 DIAGNOSIS — Z8673 Personal history of transient ischemic attack (TIA), and cerebral infarction without residual deficits: Secondary | ICD-10-CM | POA: Insufficient documentation

## 2023-06-29 DIAGNOSIS — I4891 Unspecified atrial fibrillation: Secondary | ICD-10-CM | POA: Insufficient documentation

## 2023-06-29 DIAGNOSIS — I110 Hypertensive heart disease with heart failure: Secondary | ICD-10-CM | POA: Insufficient documentation

## 2023-06-29 DIAGNOSIS — S31119A Laceration without foreign body of abdominal wall, unspecified quadrant without penetration into peritoneal cavity, initial encounter: Secondary | ICD-10-CM | POA: Diagnosis present

## 2023-06-29 DIAGNOSIS — I7121 Aneurysm of the ascending aorta, without rupture: Secondary | ICD-10-CM | POA: Diagnosis not present

## 2023-06-29 DIAGNOSIS — Z7982 Long term (current) use of aspirin: Secondary | ICD-10-CM | POA: Diagnosis not present

## 2023-06-29 DIAGNOSIS — Z79899 Other long term (current) drug therapy: Secondary | ICD-10-CM | POA: Diagnosis not present

## 2023-06-29 DIAGNOSIS — I480 Paroxysmal atrial fibrillation: Secondary | ICD-10-CM | POA: Insufficient documentation

## 2023-06-29 DIAGNOSIS — X58XXXA Exposure to other specified factors, initial encounter: Secondary | ICD-10-CM | POA: Insufficient documentation

## 2023-06-29 DIAGNOSIS — I959 Hypotension, unspecified: Secondary | ICD-10-CM | POA: Diagnosis present

## 2023-06-29 DIAGNOSIS — I5020 Unspecified systolic (congestive) heart failure: Secondary | ICD-10-CM | POA: Diagnosis not present

## 2023-06-29 DIAGNOSIS — Z7901 Long term (current) use of anticoagulants: Secondary | ICD-10-CM | POA: Insufficient documentation

## 2023-06-29 DIAGNOSIS — I10 Essential (primary) hypertension: Secondary | ICD-10-CM | POA: Insufficient documentation

## 2023-06-29 DIAGNOSIS — T148XXA Other injury of unspecified body region, initial encounter: Principal | ICD-10-CM | POA: Insufficient documentation
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
## 2023-06-30 ENCOUNTER — Emergency Department (HOSPITAL_COMMUNITY): Payer: MEDICAID

## 2023-06-30 DIAGNOSIS — S31119A Laceration without foreign body of abdominal wall, unspecified quadrant without penetration into peritoneal cavity, initial encounter: Secondary | ICD-10-CM | POA: Diagnosis present

## 2023-06-30 DIAGNOSIS — I10 Essential (primary) hypertension: Secondary | ICD-10-CM | POA: Insufficient documentation

## 2023-06-30 DIAGNOSIS — I5020 Unspecified systolic (congestive) heart failure: Secondary | ICD-10-CM | POA: Insufficient documentation

## 2023-06-30 DIAGNOSIS — T148XXA Other injury of unspecified body region, initial encounter: Principal | ICD-10-CM | POA: Insufficient documentation

## 2023-06-30 DIAGNOSIS — I4891 Unspecified atrial fibrillation: Secondary | ICD-10-CM | POA: Insufficient documentation

## 2023-06-30 DIAGNOSIS — I480 Paroxysmal atrial fibrillation: Secondary | ICD-10-CM | POA: Insufficient documentation

## 2023-06-30 DIAGNOSIS — I959 Hypotension, unspecified: Secondary | ICD-10-CM

## 2023-06-30 LAB — HEMOGLOBIN AND HEMATOCRIT, BLOOD
HCT: 40.4 % (ref 39.0–52.0)
Hemoglobin: 13.2 g/dL (ref 13.0–17.0)

## 2023-06-30 LAB — CBC WITH DIFFERENTIAL/PLATELET
Abs Immature Granulocytes: 0.04 10*3/uL (ref 0.00–0.07)
Basophils Absolute: 0.1 10*3/uL (ref 0.0–0.1)
Basophils Relative: 1 %
Eosinophils Absolute: 0.2 10*3/uL (ref 0.0–0.5)
Eosinophils Relative: 2 %
HCT: 49.6 % (ref 39.0–52.0)
Hemoglobin: 16 g/dL (ref 13.0–17.0)
Immature Granulocytes: 0 %
Lymphocytes Relative: 26 %
Lymphs Abs: 2.8 10*3/uL (ref 0.7–4.0)
MCH: 29.6 pg (ref 26.0–34.0)
MCHC: 32.3 g/dL (ref 30.0–36.0)
MCV: 91.9 fL (ref 80.0–100.0)
Monocytes Absolute: 1 10*3/uL (ref 0.1–1.0)
Monocytes Relative: 9 %
Neutro Abs: 6.8 10*3/uL (ref 1.7–7.7)
Neutrophils Relative %: 62 %
Platelets: 250 10*3/uL (ref 150–400)
RBC: 5.4 MIL/uL (ref 4.22–5.81)
RDW: 13.2 % (ref 11.5–15.5)
WBC: 10.9 10*3/uL — ABNORMAL HIGH (ref 4.0–10.5)
nRBC: 0 % (ref 0.0–0.2)

## 2023-06-30 LAB — COMPREHENSIVE METABOLIC PANEL
ALT: 18 U/L (ref 0–44)
AST: 28 U/L (ref 15–41)
Albumin: 4.1 g/dL (ref 3.5–5.0)
Alkaline Phosphatase: 47 U/L (ref 38–126)
Anion gap: 9 (ref 5–15)
BUN: 18 mg/dL (ref 8–23)
CO2: 32 mmol/L (ref 22–32)
Calcium: 9.4 mg/dL (ref 8.9–10.3)
Chloride: 95 mmol/L — ABNORMAL LOW (ref 98–111)
Creatinine, Ser: 1.58 mg/dL — ABNORMAL HIGH (ref 0.61–1.24)
GFR, Estimated: 49 mL/min — ABNORMAL LOW (ref >60)
Glucose, Bld: 87 mg/dL (ref 70–99)
Potassium: 3.7 mmol/L (ref 3.5–5.1)
Sodium: 136 mmol/L (ref 135–145)
Total Bilirubin: 1.7 mg/dL — ABNORMAL HIGH (ref 0.0–1.2)
Total Protein: 7.6 g/dL (ref 6.5–8.1)

## 2023-06-30 LAB — RAPID URINE DRUG SCREEN, HOSP PERFORMED
Amphetamines: POSITIVE — AB
Barbiturates: NOT DETECTED
Benzodiazepines: NOT DETECTED
Cocaine: NOT DETECTED
Opiates: POSITIVE — AB
Tetrahydrocannabinol: POSITIVE — AB

## 2023-06-30 LAB — TYPE AND SCREEN

## 2023-06-30 LAB — CBC
HCT: 40.7 % (ref 39.0–52.0)
Hemoglobin: 13.1 g/dL (ref 13.0–17.0)
MCH: 29.6 pg (ref 26.0–34.0)
MCHC: 32.2 g/dL (ref 30.0–36.0)
MCV: 92.1 fL (ref 80.0–100.0)
Platelets: 187 10*3/uL (ref 150–400)
RBC: 4.42 MIL/uL (ref 4.22–5.81)
RDW: 13.2 % (ref 11.5–15.5)
WBC: 6.8 10*3/uL (ref 4.0–10.5)
nRBC: 0 % (ref 0.0–0.2)

## 2023-06-30 LAB — ETHANOL: Alcohol, Ethyl (B): <10 mg/dL (ref <10)

## 2023-06-30 MED ORDER — METOPROLOL SUCCINATE ER 25 MG PO TB24
25.0000 mg | ORAL_TABLET | Freq: Every day | ORAL | Status: DC
  Administered 2023-07-01: 25 mg via ORAL
  Filled 2023-06-30: qty 1

## 2023-06-30 MED ORDER — ORAL CARE MOUTH RINSE: 15.0000 mL | OROMUCOSAL | Status: DC | PRN

## 2023-06-30 MED ORDER — IOHEXOL 350 MG/ML SOLN
75.0000 mL | Freq: Once | INTRAVENOUS | Status: AC | PRN
  Administered 2023-06-30: 75 mL via INTRAVENOUS

## 2023-06-30 MED ORDER — METOPROLOL SUCCINATE ER 25 MG PO TB24
50.0000 mg | ORAL_TABLET | Freq: Every day | ORAL | Status: DC
  Administered 2023-06-30: 50 mg via ORAL
  Filled 2023-06-30: qty 2

## 2023-06-30 MED ORDER — RIVAROXABAN 20 MG PO TABS
20.0000 mg | ORAL_TABLET | Freq: Every day | ORAL | Status: DC
  Administered 2023-07-01: 20 mg via ORAL
  Filled 2023-06-30: qty 1

## 2023-06-30 MED ORDER — SODIUM CHLORIDE 0.9 % IV BOLUS
250.0000 mL | Freq: Once | INTRAVENOUS | Status: AC
  Administered 2023-06-30: 250 mL via INTRAVENOUS
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
## 2023-06-30 NOTE — Assessment & Plan Note (Addendum)
Chronic and stable.  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   Today, appears euvolemic. Most recently saw cardiology 06/17/2023, resumed Jardiance as part of his GDMT -Hold home torsemide for now given soft blood pressures -Continue home metoprolol succinate 50 mg daily
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
## 2023-06-30 NOTE — Assessment & Plan Note (Addendum)
Chronic.  Blood pressure is on the lower end, 90s to 100 over 60s to 80s, but no concern for internal bleeding at this time. -Monitor BP, caution with excessive IV fluids given heart failure with reduced ejection fraction.  If necessary, can involve CCM. -Hold home metoprolol if MAP less than 60
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
## 2023-06-30 NOTE — ED Provider Notes (Signed)
63 year old male received in signout pending CT scan.  See previous note for full details.  Physical Exam  BP 100/75   Pulse 72   Temp 97.7 F (36.5 C) (Oral)   Resp 18   Ht 6\' 3"  (1.905 m)   Wt 108.9 kg   SpO2 95%   BMI 30.00 kg/m     Procedures  Procedures  ED Course / MDM    Medical Decision Making Amount and/or Complexity of Data Reviewed Labs: ordered. Radiology: ordered.  Risk Prescription drug management. Decision regarding hospitalization.   CT scan resulted.  No new or concerning finding.  Discussed with family practice.  They will evaluate patient for admission.       Marita Kansas, PA-C 06/30/23 1610    Ernie Avena, MD 06/30/23 [phone]

## 2023-06-30 NOTE — Plan of Care (Signed)
FMTS Interim Progress Note  S: Sleeping in bed   O: BP 106/73   Pulse 74   Temp 97.7 F (36.5 C) (Oral)   Resp 17   Ht 6\' 3"  (1.905 m)   Wt 108.9 kg   SpO2 100%   BMI 30.00 kg/m    A/P: Continue with plan per day team, monitor BP with poor heart function, prevents aggressive fluid resuscitation   Alfredo Martinez, MD 06/30/2023, 8:40 PM PGY-3, [facility] Family Medicine Service pager [phone]

## 2023-06-30 NOTE — Assessment & Plan Note (Addendum)
Chronic and stable.  Chads Vascor of 4. -Continue home metoprolol succinate 50 mg daily -Hold Xarelto for the next 24 hours given high bleeding risk
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
## 2023-06-30 NOTE — Progress Notes (Signed)
Securechat sent with Dr. Jena Gauss, no second IV access needed at this time.

## 2023-07-01 LAB — CBC WITH DIFFERENTIAL/PLATELET
Abs Immature Granulocytes: 0.02 10*3/uL (ref 0.00–0.07)
Basophils Absolute: 0.1 10*3/uL (ref 0.0–0.1)
Basophils Relative: 1 %
Eosinophils Absolute: 0.2 10*3/uL (ref 0.0–0.5)
Eosinophils Relative: 2 %
HCT: 42.3 % (ref 39.0–52.0)
Hemoglobin: 13.8 g/dL (ref 13.0–17.0)
Immature Granulocytes: 0 %
Lymphocytes Relative: 29 %
Lymphs Abs: 2 10*3/uL (ref 0.7–4.0)
MCH: 29.6 pg (ref 26.0–34.0)
MCHC: 32.6 g/dL (ref 30.0–36.0)
MCV: 90.8 fL (ref 80.0–100.0)
Monocytes Absolute: 0.6 10*3/uL (ref 0.1–1.0)
Monocytes Relative: 9 %
Neutro Abs: 4.1 10*3/uL (ref 1.7–7.7)
Neutrophils Relative %: 59 %
Platelets: 178 10*3/uL (ref 150–400)
RBC: 4.66 MIL/uL (ref 4.22–5.81)
RDW: 13.3 % (ref 11.5–15.5)
WBC: 7 10*3/uL (ref 4.0–10.5)
nRBC: 0 % (ref 0.0–0.2)

## 2023-07-01 LAB — BASIC METABOLIC PANEL
Anion gap: 9 (ref 5–15)
BUN: 17 mg/dL (ref 8–23)
CO2: 27 mmol/L (ref 22–32)
Calcium: 8.8 mg/dL — ABNORMAL LOW (ref 8.9–10.3)
Chloride: 98 mmol/L (ref 98–111)
Creatinine, Ser: 1.3 mg/dL — ABNORMAL HIGH (ref 0.61–1.24)
GFR, Estimated: >60 mL/min (ref >60)
Glucose, Bld: 89 mg/dL (ref 70–99)
Potassium: 4.3 mmol/L (ref 3.5–5.1)
Sodium: 134 mmol/L — ABNORMAL LOW (ref 135–145)

## 2023-07-01 MED ORDER — RIVAROXABAN 20 MG PO TABS: 20.0000 mg | ORAL_TABLET | Freq: Every day | ORAL | Status: DC

## 2023-07-01 NOTE — Progress Notes (Signed)
RN called about patient possibly being in the ED after not being in room. Security called and unable to find patient. Patient reported prior to leaving that his car was in the ED parking lot.
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
## 2023-07-01 NOTE — Assessment & Plan Note (Deleted)
Chronic and stable.  Chads Vascor of 4. -Continue home metoprolol succinate 50 mg daily -Hold Xarelto for the next 24 hours given high bleeding risk - resume today *** ?

## 2023-07-01 NOTE — Assessment & Plan Note (Deleted)
Chronic.  Blood pressure stable this morning. Low concern for internal bleeding at this time. -Monitor BP, caution with excessive IV fluids given heart failure with reduced ejection fraction.  If necessary, can involve CCM. -Hold home metoprolol if MAP less than 60
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
## 2023-07-01 NOTE — Assessment & Plan Note (Deleted)
Chronic and stable.  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   Today, appears euvolemic. Most recently saw cardiology 06/17/2023, resumed Jardiance as part of his GDMT -Hold home torsemide for now given soft blood pressures -Continue home metoprolol succinate 50 mg daily -AM CBC, BMP, Mg - replete electrolytes for K> 4, Mg> 2

## 2023-07-03 ENCOUNTER — Telehealth (HOSPITAL_BASED_OUTPATIENT_CLINIC_OR_DEPARTMENT_OTHER): Payer: Self-pay | Admitting: Licensed Clinical Social Worker

## 2023-07-03 NOTE — Telephone Encounter (Signed)
H&V Care Navigation CSW Progress Note  Clinical Social Worker received a text from pt requesting assistance with referral to PCP. I shared I could make an appt at the community clinic in [place] if interested or in [place] if needed. Pt shares [name] is okay. I shared that I noted he left AMA from hospital recently and they could have assisted with completing this- encouraged him next time to complete care. Pt texted "yes kinda unusual situation." Did not ask any further questions. Pt then let me know via text he was denied for disability and does not know why. Provided him Ria Comment number and extension and he will reach out, explained our team is not able to see determination reasons or assist with appeals so [facility] is his best resource. (It has been noted by [facility] that pt likely is already receiving maximum social security benefit $1700 a month).   LCSW was able to get [facility] at [facility], 11:20am. Sent pt details, address and number to cancel or change as needed.   Patient is participating in a Managed Medicaid Plan:  No, Medicaid pending  SDOH Screenings   Food Insecurity: No Food Insecurity (06/30/2023)  Recent Concern: Food Insecurity - Food Insecurity Present (05/04/2023)  Housing: Low Risk  (06/30/2023)  Recent Concern: Housing - Medium Risk (05/04/2023)  Transportation Needs: No Transportation Needs (06/30/2023)  Utilities: Not At Risk (06/30/2023)  Financial Resource Strain: Low Risk  (04/20/2023)  Tobacco Use: Low Risk  (06/29/2023)  Health Literacy: Adequate Health Literacy (04/20/2023)    Caleb [name], MSW, LCSW Clinical Social Worker II [facility] Health Heart/Vascular Care Navigation  [phone]- work cell phone (preferred) [phone]- desk phone

## 2023-07-13 ENCOUNTER — Telehealth: Payer: Self-pay | Admitting: Licensed Clinical Social Worker

## 2023-07-13 NOTE — Telephone Encounter (Signed)
H&V Care Navigation CSW Progress Note  Clinical Social Worker contacted patient by phone to f/u on text message requesting "recommendations for a decent senior place to rent." LCSW was able to reach pt at [phone]. Clarified we are not allowed to recommend a specific landlord or leasing agency for liability purposes. Pt was given housing resources, went to [employer] but wasn't able to provide documentation they asked for. Still living in car- states he called [employer] but they never contacted him back. Gives me permission to reach out to them. He was sent [employer] number as well as they have their own list of Senior specific housing. Also sent info about [employer] points this week published weekly by [employer] team via text at his request. Will f/u if I hear back from [employer].   Patient is participating in a Managed Medicaid Plan:  No, self pay only  SDOH Screenings   Food Insecurity: No Food Insecurity (06/30/2023)  Recent Concern: Food Insecurity - Food Insecurity Present (05/04/2023)  Housing: Low Risk  (06/30/2023)  Recent Concern: Housing - Medium Risk (05/04/2023)  Transportation Needs: No Transportation Needs (06/30/2023)  Utilities: Not At Risk (06/30/2023)  Financial Resource Strain: Low Risk  (04/20/2023)  Tobacco Use: Low Risk  (06/29/2023)  Health Literacy: Adequate Health Literacy (04/20/2023)    Octavio Graves, MSW, LCSW Clinical Social Worker II [facility] Health Heart/Vascular Care Navigation  [phone]- work cell phone (preferred) [phone]- desk phone

## 2023-07-13 NOTE — Telephone Encounter (Signed)
H&V Care Navigation CSW Progress Note  Clinical Social Worker contacted patient by phone to f/u on response from [employer] team. Per team member Genesis with [employer] "I do not see anything in our system related to this client making contact with [place]. He is more than welcome to attend one of our [employer] Access Points to complete a housing assessment. Once that assessment is completed, he then will be placed on the waiting list for housing programs in [place]. I will include the dates for the next two weeks. If these do not work for him, please let me know and I can provide the schedule for the following week."  I let pt know that they did not have him in their system as having contacted them but that he was strongly encouraged by their team and our team to meet with them at Access Points which have been provided to pt.  Patient is participating in a Managed Medicaid Plan:  No, self pay, Medicaid pending  SDOH Screenings   Food Insecurity: No Food Insecurity (06/30/2023)  Recent Concern: Food Insecurity - Food Insecurity Present (05/04/2023)  Housing: Low Risk  (06/30/2023)  Recent Concern: Housing - Medium Risk (05/04/2023)  Transportation Needs: No Transportation Needs (06/30/2023)  Utilities: Not At Risk (06/30/2023)  Financial Resource Strain: Low Risk  (04/20/2023)  Tobacco Use: Low Risk  (06/29/2023)  Health Literacy: Adequate Health Literacy (04/20/2023)    Octavio Graves, MSW, LCSW Clinical Social Worker II [facility] Health Heart/Vascular Care Navigation  [phone]- work cell phone (preferred) [phone]- desk phone

## 2023-07-16 ENCOUNTER — Telehealth (HOSPITAL_BASED_OUTPATIENT_CLINIC_OR_DEPARTMENT_OTHER): Payer: Self-pay | Admitting: Licensed Clinical Social Worker

## 2023-07-16 NOTE — Telephone Encounter (Signed)
 H&V Care Navigation CSW Progress Note  Clinical Social Worker contacted patient by phone to f/u on [employer] entry intake provided to pt. He was reached at [phone]. Confirmed he hasn't attended any of the access points for intake. He then states he already talked to them and has their card. I clarified again what [employer] is and that when I reached out to them last week he was not in any system as having spoken to them. I shared I knew he had gone to [employer] which is separate from [employer] process. I encouraged him to please go to an access point next week and will send those when available. He has asked several times for housing and we have given several resources asked him to contact them and had challenges with documentation status and information needed. He had been provided with some nonprofits that may be able to address some of those challenges and it is hard to tell where if anywhere he has been to other than [facility] which he completed re-screening and was denied for additional Social Security income.  He confirms he is still able to access food at this time, getting medications from his mothers home. Once I have access points for next week will send to pt.   Patient is participating in a Managed Medicaid Plan:  No, self pay only  SDOH Screenings   Food Insecurity: No Food Insecurity (06/30/2023)  Recent Concern: Food Insecurity - Food Insecurity Present (05/04/2023)  Housing: Low Risk  (06/30/2023)  Recent Concern: Housing - Medium Risk (05/04/2023)  Transportation Needs: No Transportation Needs (06/30/2023)  Utilities: Not At Risk (06/30/2023)  Financial Resource Strain: Low Risk  (04/20/2023)  Tobacco Use: Low Risk  (06/29/2023)  Health Literacy: Adequate Health Literacy (04/20/2023)    Marit Lark, MSW, LCSW Clinical Social Worker II [facility] Health Heart/Vascular Care Navigation  [phone]- work cell phone (preferred) [phone]-  desk phone

## 2023-07-30 ENCOUNTER — Telehealth (HOSPITAL_BASED_OUTPATIENT_CLINIC_OR_DEPARTMENT_OTHER): Payer: Self-pay | Admitting: Licensed Clinical Social Worker

## 2023-07-30 NOTE — Telephone Encounter (Signed)
H&V Care Navigation CSW Progress Note  Clinical Social Worker contacted patient by phone to provide reminder for upcoming PCP appt. Was unable to leave voicemail at [phone], pt did however call me back. Reminded him of appt and texted him information again. He says he will be able to get to the appt without issue. Otherwise no concerns/questions when asked at this time. Remain available as needed.  Patient is participating in a Managed Medicaid Plan:  No, self pay only  SDOH Screenings   Food Insecurity: No Food Insecurity (06/30/2023)  Recent Concern: Food Insecurity - Food Insecurity Present (05/04/2023)  Housing: Low Risk  (06/30/2023)  Recent Concern: Housing - Medium Risk (05/04/2023)  Transportation Needs: No Transportation Needs (06/30/2023)  Utilities: Not At Risk (06/30/2023)  Financial Resource Strain: Low Risk  (04/20/2023)  Tobacco Use: Low Risk  (06/29/2023)  Health Literacy: Adequate Health Literacy (04/20/2023)    Caleb [name], MSW, LCSW Clinical Social Worker II [facility] Health Heart/Vascular Care Navigation  [phone]- work cell phone (preferred) [phone]- desk phone

## 2023-08-03 ENCOUNTER — Ambulatory Visit (INDEPENDENT_AMBULATORY_CARE_PROVIDER_SITE_OTHER): Payer: No Typology Code available for payment source | Admitting: Nurse Practitioner

## 2023-08-03 ENCOUNTER — Encounter: Payer: Self-pay | Admitting: Nurse Practitioner

## 2023-08-03 VITALS — BP 87/63 | HR 77 | Temp 96.3°F | Ht 75.0 in | Wt 262.0 lb

## 2023-08-03 DIAGNOSIS — F1990 Other psychoactive substance use, unspecified, uncomplicated: Secondary | ICD-10-CM

## 2023-08-03 DIAGNOSIS — I639 Cerebral infarction, unspecified: Secondary | ICD-10-CM

## 2023-08-03 DIAGNOSIS — S31119S Laceration without foreign body of abdominal wall, unspecified quadrant without penetration into peritoneal cavity, sequela: Secondary | ICD-10-CM

## 2023-08-03 DIAGNOSIS — I5020 Unspecified systolic (congestive) heart failure: Secondary | ICD-10-CM

## 2023-08-03 DIAGNOSIS — Z8673 Personal history of transient ischemic attack (TIA), and cerebral infarction without residual deficits: Secondary | ICD-10-CM | POA: Insufficient documentation

## 2023-08-03 DIAGNOSIS — I4891 Unspecified atrial fibrillation: Secondary | ICD-10-CM

## 2023-08-03 DIAGNOSIS — I10 Essential (primary) hypertension: Secondary | ICD-10-CM

## 2023-08-03 MED ORDER — METOPROLOL SUCCINATE ER 25 MG PO TB24: 25.0000 mg | ORAL_TABLET | Freq: Every day | ORAL | 3 refills | Status: DC

## 2023-08-03 NOTE — Patient Instructions (Signed)
 1. Atrial fibrillation, unspecified type (HCC) (Primary)  - metoprolol succinate (TOPROL-XL) 25 MG 24 hr tablet; Take 1 tablet (25 mg total) by mouth daily.  Dispense: 90 tablet; Refill: 3 - Basic Metabolic Panel  2. HFrEF (heart failure with reduced ejection fraction) (HCC)  - metoprolol succinate (TOPROL-XL) 25 MG 24 hr tablet; Take 1 tablet (25 mg total) by mouth daily.  Dispense: 90 tablet; Refill: 3 - Basic Metabolic Panel   Around 3 times per week, check your blood pressure 2 times per day. once in the morning and once in the evening. The readings should be at least one minute apart. Write down these values and bring them to your next nurse visit/appointment.  When you check your BP, make sure you have been doing something calm/relaxing 5 minutes prior to checking. Both feet should be flat on the floor and you should be sitting. Use your left arm and make sure it is in a relaxed position (on a table), and that the cuff is at the approximate level/height of your heart.    It is important that you exercise regularly at least 30 minutes 5 times a week as tolerated  Think about what you will eat, plan ahead. Choose " clean, green, fresh or frozen" over canned, processed or packaged foods which are more sugary, salty and fatty. 70 to 75% of food eaten should be vegetables and fruit. Three meals at set times with snacks allowed between meals, but they must be fruit or vegetables. Aim to eat over a 12 hour period , example 7 am to 7 pm, and STOP after  your last meal of the day. Drink water,generally about 64 ounces per day, no other drink is as healthy. Fruit juice is best enjoyed in a healthy way, by EATING the fruit.  Thanks for choosing [facility] we consider it a privelige to serve you.

## 2023-08-03 NOTE — Assessment & Plan Note (Signed)
 Need to avoid use of illicit drugs discussed
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
## 2023-08-03 NOTE — Assessment & Plan Note (Signed)
 Continue Crestor 20 mg daily Will check fasting lipid panel at next visit in 4 weeks

## 2023-08-03 NOTE — Assessment & Plan Note (Signed)
 BP Readings from Last 3 Encounters:  08/03/23 (!) 87/63  07/01/23 109/79  06/17/23 110/60  Due to hypotension metoprolol decreased to 25 mg daily, continue torsemide 20 mg daily Encouraged to monitor blood pressure at home, blood pressure goal is less than 130/80 Discussed DASH diet and dietary sodium restrictions Continue to increase dietary efforts and exercise.  Follow-up in 4 weeks

## 2023-08-03 NOTE — Assessment & Plan Note (Addendum)
 Followed by cardiology Continue torsemide 20 mg daily, Jardiance 10 mg daily ,will decrease to 25 mg daily due to hypotension

## 2023-08-03 NOTE — Assessment & Plan Note (Addendum)
 Chronic and stable Continue Xarelto 20 mg daily, metoprolol decreased to 25 mg daily due to hypotension

## 2023-08-03 NOTE — Assessment & Plan Note (Signed)
 Site is completely healed Unable to remove all sutures, patient to go to urgent care for suture removal

## 2023-08-21 ENCOUNTER — Other Ambulatory Visit: Payer: Self-pay

## 2023-09-01 ENCOUNTER — Encounter: Payer: Self-pay | Admitting: Nurse Practitioner

## 2023-09-01 ENCOUNTER — Ambulatory Visit (INDEPENDENT_AMBULATORY_CARE_PROVIDER_SITE_OTHER): Payer: Self-pay | Admitting: Nurse Practitioner

## 2023-09-01 VITALS — BP 94/67 | HR 68 | Temp 97.9°F | Wt 265.6 lb

## 2023-09-01 DIAGNOSIS — L089 Local infection of the skin and subcutaneous tissue, unspecified: Secondary | ICD-10-CM

## 2023-09-01 DIAGNOSIS — I4891 Unspecified atrial fibrillation: Secondary | ICD-10-CM

## 2023-09-01 DIAGNOSIS — I10 Essential (primary) hypertension: Secondary | ICD-10-CM

## 2023-09-01 DIAGNOSIS — T148XXA Other injury of unspecified body region, initial encounter: Secondary | ICD-10-CM

## 2023-09-01 DIAGNOSIS — I5020 Unspecified systolic (congestive) heart failure: Secondary | ICD-10-CM

## 2023-09-01 DIAGNOSIS — Z8673 Personal history of transient ischemic attack (TIA), and cerebral infarction without residual deficits: Secondary | ICD-10-CM

## 2023-09-01 MED ORDER — DOXYCYCLINE HYCLATE 100 MG PO TABS: 100.0000 mg | ORAL_TABLET | Freq: Two times a day (BID) | ORAL | 0 refills | Status: DC

## 2023-09-01 NOTE — Patient Instructions (Signed)

## 2023-09-01 NOTE — Assessment & Plan Note (Addendum)
 He has stopped taking metoprolol Continue Xarelto 20 mg daily Need to be on and rhythm  control medication due to  A-fib discussed encouraged him to follow-up with cardiology

## 2023-09-01 NOTE — Assessment & Plan Note (Signed)
 Continue torsemide 20 mg daily, Jardiance 10 mg daily Patient has stopped taking metoprolol Encouraged to maintain close follow-up with cardiology

## 2023-09-01 NOTE — Assessment & Plan Note (Addendum)
 Lab Results  Component Value Date   CHOL 126 03/12/2023   HDL 32 (L) 03/12/2023   LDLCALC 81 03/12/2023   TRIG 65 03/12/2023   CHOLHDL 3.9 03/12/2023  Need to take Crestor 20 mg daily as ordered discussed but the patient states that he is not going to take the medication.
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
## 2023-09-01 NOTE — Assessment & Plan Note (Signed)
-   doxycycline (VIBRA-TABS) 100 MG tablet; Take 1 tablet (100 mg total) by mouth 2 (two) times daily.  Dispense: 14 tablet; Refill: 0  Encouraged to keep sites clean and dry, follow-up in the office if wound does not get better after completion of antibiotics Patient encouraged to go to the emergency room to have sutures removed

## 2023-09-01 NOTE — Assessment & Plan Note (Signed)
 BP Readings from Last 3 Encounters:  09/01/23 94/67  08/03/23 (!) 87/63  07/01/23 109/79   HTN Controlled  On torsemide 20 mg daily Continue current medications. No changes in management. Discussed DASH diet and dietary sodium restrictions Continue to increase dietary efforts and exercise.

## 2023-09-02 ENCOUNTER — Other Ambulatory Visit: Payer: Self-pay

## 2023-09-02 ENCOUNTER — Encounter (HOSPITAL_COMMUNITY): Payer: Self-pay

## 2023-09-02 ENCOUNTER — Inpatient Hospital Stay (HOSPITAL_COMMUNITY)
Admission: EM | Admit: 2023-09-02 | Discharge: 2023-09-04 | DRG: 603 | Discharge status: Against Medical Advice | Payer: Self-pay | Attending: Family Medicine | Admitting: Family Medicine

## 2023-09-02 ENCOUNTER — Emergency Department (HOSPITAL_COMMUNITY): Payer: Self-pay

## 2023-09-02 DIAGNOSIS — Z9889 Other specified postprocedural states: Secondary | ICD-10-CM

## 2023-09-02 DIAGNOSIS — I714 Abdominal aortic aneurysm, without rupture, unspecified: Secondary | ICD-10-CM | POA: Diagnosis present

## 2023-09-02 DIAGNOSIS — Z79899 Other long term (current) drug therapy: Secondary | ICD-10-CM

## 2023-09-02 DIAGNOSIS — Z833 Family history of diabetes mellitus: Secondary | ICD-10-CM

## 2023-09-02 DIAGNOSIS — E785 Hyperlipidemia, unspecified: Secondary | ICD-10-CM | POA: Diagnosis present

## 2023-09-02 DIAGNOSIS — Z8673 Personal history of transient ischemic attack (TIA), and cerebral infarction without residual deficits: Secondary | ICD-10-CM

## 2023-09-02 DIAGNOSIS — Z5329 Procedure and treatment not carried out because of patient's decision for other reasons: Secondary | ICD-10-CM | POA: Diagnosis present

## 2023-09-02 DIAGNOSIS — Z8701 Personal history of pneumonia (recurrent): Secondary | ICD-10-CM

## 2023-09-02 DIAGNOSIS — Z9089 Acquired absence of other organs: Secondary | ICD-10-CM

## 2023-09-02 DIAGNOSIS — S51811A Laceration without foreign body of right forearm, initial encounter: Secondary | ICD-10-CM | POA: Diagnosis present

## 2023-09-02 DIAGNOSIS — Z952 Presence of prosthetic heart valve: Secondary | ICD-10-CM

## 2023-09-02 DIAGNOSIS — I4891 Unspecified atrial fibrillation: Secondary | ICD-10-CM | POA: Diagnosis present

## 2023-09-02 DIAGNOSIS — Z7901 Long term (current) use of anticoagulants: Secondary | ICD-10-CM

## 2023-09-02 DIAGNOSIS — I420 Dilated cardiomyopathy: Secondary | ICD-10-CM | POA: Diagnosis present

## 2023-09-02 DIAGNOSIS — L03113 Cellulitis of right upper limb: Principal | ICD-10-CM | POA: Diagnosis present

## 2023-09-02 DIAGNOSIS — T148XXA Other injury of unspecified body region, initial encounter: Secondary | ICD-10-CM | POA: Diagnosis present

## 2023-09-02 DIAGNOSIS — Z8781 Personal history of (healed) traumatic fracture: Secondary | ICD-10-CM

## 2023-09-02 DIAGNOSIS — I130 Hypertensive heart and chronic kidney disease with heart failure and stage 1 through stage 4 chronic kidney disease, or unspecified chronic kidney disease: Secondary | ICD-10-CM | POA: Diagnosis present

## 2023-09-02 DIAGNOSIS — I5022 Chronic systolic (congestive) heart failure: Secondary | ICD-10-CM | POA: Diagnosis present

## 2023-09-02 DIAGNOSIS — L089 Local infection of the skin and subcutaneous tissue, unspecified: Secondary | ICD-10-CM | POA: Diagnosis present

## 2023-09-02 DIAGNOSIS — Z7982 Long term (current) use of aspirin: Secondary | ICD-10-CM

## 2023-09-02 DIAGNOSIS — N182 Chronic kidney disease, stage 2 (mild): Secondary | ICD-10-CM | POA: Diagnosis present

## 2023-09-02 DIAGNOSIS — R739 Hyperglycemia, unspecified: Secondary | ICD-10-CM | POA: Diagnosis present

## 2023-09-02 DIAGNOSIS — L02413 Cutaneous abscess of right upper limb: Secondary | ICD-10-CM | POA: Diagnosis present

## 2023-09-02 DIAGNOSIS — I5042 Chronic combined systolic (congestive) and diastolic (congestive) heart failure: Secondary | ICD-10-CM | POA: Diagnosis present

## 2023-09-02 DIAGNOSIS — E871 Hypo-osmolality and hyponatremia: Secondary | ICD-10-CM | POA: Diagnosis present

## 2023-09-02 DIAGNOSIS — I480 Paroxysmal atrial fibrillation: Secondary | ICD-10-CM | POA: Diagnosis present

## 2023-09-02 DIAGNOSIS — Z87828 Personal history of other (healed) physical injury and trauma: Secondary | ICD-10-CM

## 2023-09-02 DIAGNOSIS — Z8679 Personal history of other diseases of the circulatory system: Secondary | ICD-10-CM

## 2023-09-02 DIAGNOSIS — Z7984 Long term (current) use of oral hypoglycemic drugs: Secondary | ICD-10-CM

## 2023-09-02 DIAGNOSIS — I428 Other cardiomyopathies: Secondary | ICD-10-CM

## 2023-09-02 DIAGNOSIS — Z8249 Family history of ischemic heart disease and other diseases of the circulatory system: Secondary | ICD-10-CM

## 2023-09-02 DIAGNOSIS — I4821 Permanent atrial fibrillation: Secondary | ICD-10-CM | POA: Diagnosis present

## 2023-09-02 DIAGNOSIS — I7121 Aneurysm of the ascending aorta, without rupture: Secondary | ICD-10-CM | POA: Diagnosis present

## 2023-09-02 LAB — CBC WITH DIFFERENTIAL/PLATELET
Abs Immature Granulocytes: 0.03 10*3/uL (ref 0.00–0.07)
Basophils Absolute: 0.1 10*3/uL (ref 0.0–0.1)
Basophils Relative: 1 %
Eosinophils Absolute: 0.2 10*3/uL (ref 0.0–0.5)
Eosinophils Relative: 2 %
HCT: 48.3 % (ref 39.0–52.0)
Hemoglobin: 14.9 g/dL (ref 13.0–17.0)
Immature Granulocytes: 0 %
Lymphocytes Relative: 17 %
Lymphs Abs: 1.7 10*3/uL (ref 0.7–4.0)
MCH: 26.8 pg (ref 26.0–34.0)
MCHC: 30.8 g/dL (ref 30.0–36.0)
MCV: 86.7 fL (ref 80.0–100.0)
Monocytes Absolute: 0.7 10*3/uL (ref 0.1–1.0)
Monocytes Relative: 6 %
Neutro Abs: 7.5 10*3/uL (ref 1.7–7.7)
Neutrophils Relative %: 74 %
Platelets: 239 10*3/uL (ref 150–400)
RBC: 5.57 MIL/uL (ref 4.22–5.81)
RDW: 13.6 % (ref 11.5–15.5)
WBC: 10.1 10*3/uL (ref 4.0–10.5)
nRBC: 0 % (ref 0.0–0.2)

## 2023-09-02 LAB — COMPREHENSIVE METABOLIC PANEL
ALT: 14 U/L (ref 0–44)
AST: 22 U/L (ref 15–41)
Albumin: 4.1 g/dL (ref 3.5–5.0)
Alkaline Phosphatase: 55 U/L (ref 38–126)
Anion gap: 9 (ref 5–15)
BUN: 19 mg/dL (ref 8–23)
CO2: 28 mmol/L (ref 22–32)
Calcium: 8.9 mg/dL (ref 8.9–10.3)
Chloride: 97 mmol/L — ABNORMAL LOW (ref 98–111)
Creatinine, Ser: 1.34 mg/dL — ABNORMAL HIGH (ref 0.61–1.24)
GFR, Estimated: 60 mL/min — ABNORMAL LOW (ref >60)
Glucose, Bld: 117 mg/dL — ABNORMAL HIGH (ref 70–99)
Potassium: 3.8 mmol/L (ref 3.5–5.1)
Sodium: 134 mmol/L — ABNORMAL LOW (ref 135–145)
Total Bilirubin: 1.7 mg/dL — ABNORMAL HIGH (ref 0.0–1.2)
Total Protein: 7.9 g/dL (ref 6.5–8.1)

## 2023-09-02 LAB — I-STAT CG4 LACTIC ACID, ED: Lactic Acid, Venous: 1.1 mmol/L (ref 0.5–1.9)

## 2023-09-02 LAB — HEMOGLOBIN A1C
Hgb A1c MFr Bld: 5.3 % (ref 4.8–5.6)
Mean Plasma Glucose: 105.41 mg/dL

## 2023-09-02 MED ORDER — METOPROLOL SUCCINATE ER 25 MG PO TB24
25.0000 mg | ORAL_TABLET | Freq: Every day | ORAL | Status: DC
  Filled 2023-09-02: qty 1

## 2023-09-02 MED ORDER — ASPIRIN 81 MG PO CHEW
81.0000 mg | CHEWABLE_TABLET | Freq: Every day | ORAL | Status: DC
  Filled 2023-09-02: qty 1

## 2023-09-02 MED ORDER — ACETAMINOPHEN 650 MG RE SUPP: 650.0000 mg | Freq: Three times a day (TID) | RECTAL | Status: DC

## 2023-09-02 MED ORDER — OXYCODONE HCL 5 MG PO TABS
5.0000 mg | ORAL_TABLET | ORAL | Status: DC | PRN
  Administered 2023-09-03 – 2023-09-04 (×4): 5 mg via ORAL
  Filled 2023-09-02 (×5): qty 1

## 2023-09-02 MED ORDER — TORSEMIDE 20 MG PO TABS
20.0000 mg | ORAL_TABLET | Freq: Every day | ORAL | Status: DC
  Administered 2023-09-03 – 2023-09-04 (×2): 20 mg via ORAL
  Filled 2023-09-02 (×2): qty 1

## 2023-09-02 MED ORDER — EMPAGLIFLOZIN 10 MG PO TABS
10.0000 mg | ORAL_TABLET | Freq: Every day | ORAL | Status: DC
  Administered 2023-09-03 – 2023-09-04 (×2): 10 mg via ORAL
  Filled 2023-09-02 (×2): qty 1

## 2023-09-02 MED ORDER — HYDROMORPHONE HCL 1 MG/ML IJ SOLN
0.5000 mg | INTRAMUSCULAR | Status: DC | PRN
  Administered 2023-09-03: 0.5 mg via INTRAVENOUS
  Filled 2023-09-02 (×2): qty 1

## 2023-09-02 MED ORDER — LIDOCAINE-EPINEPHRINE (PF) 2 %-1:200000 IJ SOLN
20.0000 mL | Freq: Once | INTRAMUSCULAR | Status: AC
  Administered 2023-09-02: 20 mL
  Filled 2023-09-02: qty 20

## 2023-09-02 MED ORDER — ROSUVASTATIN CALCIUM 20 MG PO TABS
20.0000 mg | ORAL_TABLET | Freq: Every day | ORAL | Status: DC
  Administered 2023-09-03: 20 mg via ORAL
  Filled 2023-09-02: qty 1

## 2023-09-02 MED ORDER — VANCOMYCIN HCL 1750 MG/350ML IV SOLN: 1750.0000 mg | INTRAVENOUS | Status: DC

## 2023-09-02 MED ORDER — MORPHINE SULFATE (PF) 4 MG/ML IV SOLN
4.0000 mg | Freq: Once | INTRAVENOUS | Status: AC
  Administered 2023-09-02: 4 mg via INTRAVENOUS
  Filled 2023-09-02: qty 1

## 2023-09-02 MED ORDER — VANCOMYCIN HCL IN DEXTROSE 1-5 GM/200ML-% IV SOLN
1000.0000 mg | Freq: Once | INTRAVENOUS | Status: AC
  Administered 2023-09-02: 1000 mg via INTRAVENOUS
  Filled 2023-09-02: qty 200

## 2023-09-02 MED ORDER — VANCOMYCIN HCL 1500 MG/300ML IV SOLN
1500.0000 mg | Freq: Once | INTRAVENOUS | Status: AC
  Administered 2023-09-02: 1500 mg via INTRAVENOUS
  Filled 2023-09-02: qty 300

## 2023-09-02 MED ORDER — SENNOSIDES-DOCUSATE SODIUM 8.6-50 MG PO TABS: 1.0000 | ORAL_TABLET | Freq: Every evening | ORAL | Status: DC | PRN

## 2023-09-02 MED ORDER — ACETAMINOPHEN 500 MG PO TABS
1000.0000 mg | ORAL_TABLET | Freq: Three times a day (TID) | ORAL | Status: DC
  Administered 2023-09-03 (×2): 1000 mg via ORAL
  Filled 2023-09-02 (×5): qty 2

## 2023-09-02 MED ORDER — RIVAROXABAN 10 MG PO TABS
20.0000 mg | ORAL_TABLET | Freq: Every day | ORAL | Status: DC
  Administered 2023-09-03 – 2023-09-04 (×2): 20 mg via ORAL
  Filled 2023-09-02 (×2): qty 2

## 2023-09-02 NOTE — ED Notes (Signed)
 Wound on right forearm cleaned and dry dressing placed

## 2023-09-02 NOTE — Progress Notes (Signed)
 ED Pharmacy Antibiotic Sign Off An antibiotic consult was received from an ED provider for vancomycin per pharmacy dosing for cellulitis/abscess. A chart review was completed to assess appropriateness.   The following one time order(s) were placed:  Vancomycin 2500 mg  Further antibiotic and/or antibiotic pharmacy consults should be ordered by the admitting provider if indicated.   Thank you for allowing pharmacy to be a part of this patient's care.   Pricilla Riffle, PharmD, BCPS Clinical Pharmacist 09/02/2023 5:59 PM
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
## 2023-09-03 DIAGNOSIS — I420 Dilated cardiomyopathy: Secondary | ICD-10-CM

## 2023-09-03 DIAGNOSIS — I5022 Chronic systolic (congestive) heart failure: Secondary | ICD-10-CM

## 2023-09-03 DIAGNOSIS — I428 Other cardiomyopathies: Secondary | ICD-10-CM

## 2023-09-03 DIAGNOSIS — L03113 Cellulitis of right upper limb: Secondary | ICD-10-CM | POA: Diagnosis present

## 2023-09-03 LAB — CBC
HCT: 45.7 % (ref 39.0–52.0)
Hemoglobin: 14.3 g/dL (ref 13.0–17.0)
MCH: 26.8 pg (ref 26.0–34.0)
MCHC: 31.3 g/dL (ref 30.0–36.0)
MCV: 85.7 fL (ref 80.0–100.0)
Platelets: 207 10*3/uL (ref 150–400)
RBC: 5.33 MIL/uL (ref 4.22–5.81)
RDW: 13.6 % (ref 11.5–15.5)
WBC: 9.1 10*3/uL (ref 4.0–10.5)
nRBC: 0 % (ref 0.0–0.2)

## 2023-09-03 LAB — CBG MONITORING, ED: Glucose-Capillary: 89 mg/dL (ref 70–99)

## 2023-09-03 LAB — GLUCOSE, CAPILLARY: Glucose-Capillary: 100 mg/dL — ABNORMAL HIGH (ref 70–99)

## 2023-09-03 LAB — BASIC METABOLIC PANEL WITH GFR
Anion gap: 7 (ref 5–15)
BUN: 17 mg/dL (ref 8–23)
CO2: 28 mmol/L (ref 22–32)
Calcium: 8.6 mg/dL — ABNORMAL LOW (ref 8.9–10.3)
Chloride: 98 mmol/L (ref 98–111)
Creatinine, Ser: 1.16 mg/dL (ref 0.61–1.24)
GFR, Estimated: >60 mL/min (ref >60)
Glucose, Bld: 97 mg/dL (ref 70–99)
Potassium: 3.7 mmol/L (ref 3.5–5.1)
Sodium: 133 mmol/L — ABNORMAL LOW (ref 135–145)

## 2023-09-03 MED ORDER — MEDIHONEY WOUND/BURN DRESSING EX PSTE
1.0000 | PASTE | Freq: Every day | CUTANEOUS | Status: DC
  Administered 2023-09-03 – 2023-09-04 (×2): 1 via TOPICAL
  Filled 2023-09-03 (×2): qty 44

## 2023-09-03 MED ORDER — VANCOMYCIN HCL IN DEXTROSE 1-5 GM/200ML-% IV SOLN
1000.0000 mg | Freq: Two times a day (BID) | INTRAVENOUS | Status: DC
  Administered 2023-09-03 – 2023-09-04 (×3): 1000 mg via INTRAVENOUS
  Filled 2023-09-03 (×4): qty 200

## 2023-09-03 NOTE — ED Notes (Signed)
 RN called to let secretary know patient is about to be transported

## 2023-09-03 NOTE — Progress Notes (Signed)
 Pharmacy Antibiotic Note  C[facility] [name] is a 63 y.o. male admitted on 09/02/2023 with UTI and wound infection .  Pharmacy has been consulted for Vanco dosing.  ID: Wound infection R arm with purulent cellulitis + wound to L thigh of unknown origin. Afebrile, WBC 9.1, Scr 1.16 down  Vanco 3/27>>  3/26: BC x 2>>  Plan: Change to Vanco 1g IV q12hr (AUC 482, Scr 1.15, VD 0.5)   Height: 6\' 3"  (190.5 cm) Weight: 120 kg (264 lb 8.8 oz) IBW/kg (Calculated) : 84.5  Temp (24hrs), Avg:98.1 F (36.7 C), Min:97.9 F (36.6 C), Max:98.3 F (36.8 C)  Recent Labs  Lab 09/02/23 1731 09/02/23 1739 09/03/23 0553  WBC 10.1  --  9.1  CREATININE 1.34*  --  1.16  LATICACIDVEN  --  1.1  --     Estimated Creatinine Clearance: 91 mL/min (by C-G formula based on SCr of 1.16 mg/dL).    No Known Allergies  [name] S. [name], PharmD, BCPS Clinical Staff Pharmacist  [name] 09/03/2023 2:42 PM

## 2023-09-03 NOTE — Plan of Care (Signed)
  Problem: Clinical Measurements: Goal: Ability to avoid or minimize complications of infection will improve Outcome: Progressing   Problem: Skin Integrity: Goal: Skin integrity will improve Outcome: Progressing   Problem: Education: Goal: Knowledge of disease or condition will improve Outcome: Progressing Goal: Understanding of medication regimen will improve Outcome: Progressing Goal: Individualized Educational Video(s) Outcome: Progressing   Problem: Activity: Goal: Ability to tolerate increased activity will improve Outcome: Progressing   Problem: Cardiac: Goal: Ability to achieve and maintain adequate cardiopulmonary perfusion will improve Outcome: Progressing   Problem: Health Behavior/Discharge Planning: Goal: Ability to safely manage health-related needs after discharge will improve Outcome: Progressing

## 2023-09-03 NOTE — Plan of Care (Signed)
  Problem: Education: Goal: Ability to describe self-care measures that may prevent or decrease complications (Diabetes Survival Skills Education) will improve Outcome: Progressing Goal: Individualized Educational Video(s) Outcome: Progressing   Problem: Coping: Goal: Ability to adjust to condition or change in health will improve Outcome: Progressing   Problem: Fluid Volume: Goal: Ability to maintain a balanced intake and output will improve Outcome: Progressing   Problem: Health Behavior/Discharge Planning: Goal: Ability to identify and utilize available resources and services will improve Outcome: Progressing Goal: Ability to manage health-related needs will improve Outcome: Progressing   Problem: Metabolic: Goal: Ability to maintain appropriate glucose levels will improve Outcome: Progressing   Problem: Nutritional: Goal: Maintenance of adequate nutrition will improve Outcome: Progressing Goal: Progress toward achieving an optimal weight will improve Outcome: Progressing   Problem: Skin Integrity: Goal: Risk for impaired skin integrity will decrease Outcome: Progressing   Problem: Tissue Perfusion: Goal: Adequacy of tissue perfusion will improve Outcome: Progressing   Problem: Education: Goal: Knowledge of General Education information will improve Description: Including pain rating scale, medication(s)/side effects and non-pharmacologic comfort measures Outcome: Progressing   Problem: Health Behavior/Discharge Planning: Goal: Ability to manage health-related needs will improve Outcome: Progressing   Problem: Clinical Measurements: Goal: Ability to maintain clinical measurements within normal limits will improve Outcome: Progressing Goal: Will remain free from infection Outcome: Progressing Goal: Diagnostic test results will improve Outcome: Progressing Goal: Respiratory complications will improve Outcome: Progressing Goal: Cardiovascular complication will  be avoided Outcome: Progressing   Problem: Activity: Goal: Risk for activity intolerance will decrease Outcome: Progressing   Problem: Nutrition: Goal: Adequate nutrition will be maintained Outcome: Progressing   Problem: Coping: Goal: Level of anxiety will decrease Outcome: Progressing   Problem: Elimination: Goal: Will not experience complications related to bowel motility Outcome: Progressing Goal: Will not experience complications related to urinary retention Outcome: Progressing   Problem: Pain Managment: Goal: General experience of comfort will improve and/or be controlled Outcome: Progressing   Problem: Safety: Goal: Ability to remain free from injury will improve Outcome: Progressing   Problem: Skin Integrity: Goal: Risk for impaired skin integrity will decrease Outcome: Progressing   Problem: Clinical Measurements: Goal: Ability to avoid or minimize complications of infection will improve Outcome: Progressing   Problem: Skin Integrity: Goal: Skin integrity will improve Outcome: Progressing   Problem: Education: Goal: Knowledge of disease or condition will improve Outcome: Progressing Goal: Understanding of medication regimen will improve Outcome: Progressing Goal: Individualized Educational Video(s) Outcome: Progressing   Problem: Activity: Goal: Ability to tolerate increased activity will improve Outcome: Progressing   Problem: Cardiac: Goal: Ability to achieve and maintain adequate cardiopulmonary perfusion will improve Outcome: Progressing   Problem: Health Behavior/Discharge Planning: Goal: Ability to safely manage health-related needs after discharge will improve Outcome: Progressing
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
## 2023-09-03 NOTE — ED Notes (Signed)
 Patient refused morning meds states his primary doctor stopped them.
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
## 2023-09-04 DIAGNOSIS — I4891 Unspecified atrial fibrillation: Secondary | ICD-10-CM

## 2023-09-04 LAB — GLUCOSE, CAPILLARY: Glucose-Capillary: 97 mg/dL (ref 70–99)

## 2023-09-04 MED ORDER — METOPROLOL SUCCINATE ER 25 MG PO TB24
25.0000 mg | ORAL_TABLET | Freq: Every day | ORAL | Status: DC
  Administered 2023-09-04: 25 mg via ORAL
  Filled 2023-09-04: qty 1

## 2023-09-04 NOTE — Progress Notes (Signed)
     Patient Name: Caleb [name]           DOB: 14-Mar-1961  MRN: [id number]      Admission Date: 09/02/2023  Attending Provider: Standley Brooking, MD  Primary Diagnosis: Cellulitis of right forearm   Level of care: Med-Surg   OVERNIGHT PROGRESS REPORT   At 2110, I was notified by the bedside RN that the patient was not present in the room during the shift assessment at 2048. The patient is currently not on the unit, and it is assumed that they have left the facility. Nursing staff, [facility], and security have conducted a search, but the patient has not been located.  According to the RN, the patient was alert and oriented. The patient's IV was found in the room, placed on the sink.    Anthoney Harada, DNP, ACNPC- AG [facility]

## 2023-09-04 NOTE — Progress Notes (Signed)
 Went to patient room to complete shift assessment and patient not in room or bathroom. IV had been removed with cath intact and placed on the sink in the room. Patient not found on the unit. AC made aware and went outside to look for the patient with no success.

## 2023-09-04 NOTE — Progress Notes (Signed)
 On-Call NP Virgel Manifold) made aware patient not present in room when going to perform shift assessment and the patient had removed IV placing it on the sink.

## 2023-09-04 NOTE — Progress Notes (Signed)
 Security advised the patient was seen leaving the facility with family around 2000 via Doctor, general practice.

## 2023-09-04 NOTE — Plan of Care (Signed)
  Problem: Coping: Goal: Ability to adjust to condition or change in health will improve Outcome: Progressing   Problem: Nutritional: Goal: Maintenance of adequate nutrition will improve Outcome: Progressing   Problem: Activity: Goal: Risk for activity intolerance will decrease Outcome: Progressing   Problem: Nutrition: Goal: Adequate nutrition will be maintained Outcome: Progressing   Problem: Coping: Goal: Level of anxiety will decrease Outcome: Progressing   Problem: Elimination: Goal: Will not experience complications related to urinary retention Outcome: Progressing   Problem: Pain Managment: Goal: General experience of comfort will improve and/or be controlled Outcome: Progressing   Problem: Safety: Goal: Ability to remain free from injury will improve Outcome: Progressing   Problem: Skin Integrity: Goal: Risk for impaired skin integrity will decrease Outcome: Progressing   Problem: Cardiac: Goal: Ability to achieve and maintain adequate cardiopulmonary perfusion will improve Outcome: Progressing
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
## 2023-09-07 LAB — CULTURE, BLOOD (ROUTINE X 2)
Culture: NO GROWTH
Culture: NO GROWTH
Special Requests: ADEQUATE
Special Requests: ADEQUATE

## 2023-09-09 ENCOUNTER — Telehealth: Payer: Self-pay | Admitting: Licensed Clinical Social Worker

## 2023-09-09 NOTE — Telephone Encounter (Signed)
 H&V Care Navigation CSW Progress Note  Clinical Social Worker  received call from pt  to let me know that he received a Medicaid card in the mail and wants to know if valid. Also has questions about disability application. I let him know that I will check on these and verify any updates with pt. I shared with pt that I noted he was staying with son per notes from [facility] at admission. He states he is not living with a son, has been staying with friends. Inquired why he left AMA- he states the bed was uncomfortable and he didn't think anyone was listnening when he mentioned it was uncomfortable. I encouraged him to finish treatment plans prior to leaving hospital since this is now the second time he has left prior to medical clearance.   LCSW reached out to [name] with [facility] for disability updates. I also reached out to Cathlyn Parsons, [facility] DSS to see if any updates on Medicaid.   I received the following from Lauren, "Yes, Mr. Traweek is receiving full Disability Medicaid benefits effective 03/11/23.  Due to being over the income limit, he was eligible for Deductible Medicaid so will owe some portion of his hospital bills from October. If he has any other questions, or needs another copy of the notices explaining this, the caseworker listed is Junius Roads, [phone]."  This information was sent to pt, let him know I haven't received any updates regarding disability at this time.   Patient is participating in a Managed Medicaid Plan:  No, Medicaid [place] only  SDOH Screenings   Food Insecurity: No Food Insecurity (09/03/2023)  Housing: Low Risk  (09/03/2023)  Transportation Needs: No Transportation Needs (09/03/2023)  Utilities: Not At Risk (09/03/2023)  Depression (PHQ2-9): Low Risk  (08/03/2023)  Financial Resource Strain: Low Risk  (04/20/2023)  Tobacco Use: Low Risk  (09/02/2023)  Health Literacy: Adequate Health Literacy (04/20/2023)   Octavio Graves, MSW, LCSW Clinical Social  Worker II [facility] Health Heart/Vascular Care Navigation  [phone]- work cell phone (preferred) [phone]- desk phone

## 2023-09-11 ENCOUNTER — Telehealth: Payer: Self-pay | Admitting: Licensed Clinical Social Worker

## 2023-09-11 NOTE — Telephone Encounter (Signed)
 H&V Care Navigation CSW Progress Note  Clinical Social Worker  received text message from pt  to inquire if he has EBT and concerns that family may have his card. I shared that I am not able to access the EBT benefits system- if he has concerns about whether or not he qualified/was sent a card I encouraged him to go to DSS. He asks for DSS caseworkers number I had previously provided- clarified that was his Medicaid caseworker and I am not sure if they are able to access in their system. Provided him Medicaid caseworker's name Junius Roads, [phone] and the DSS address, encouraged him to go in person to verify benefits.   Patient is participating in a Managed Medicaid Plan:  yes- [id number] O   SDOH Screenings   Food Insecurity: No Food Insecurity (09/03/2023)  Housing: Low Risk  (09/03/2023)  Transportation Needs: No Transportation Needs (09/03/2023)  Utilities: Not At Risk (09/03/2023)  Depression (PHQ2-9): Low Risk  (08/03/2023)  Financial Resource Strain: Low Risk  (04/20/2023)  Tobacco Use: Low Risk  (09/02/2023)  Health Literacy: Adequate Health Literacy (04/20/2023)    Caleb [name], MSW, LCSW Clinical Social Worker II [facility] Heart/Vascular Care Navigation  [phone]- work cell phone (preferred) [phone]- desk phone

## 2023-09-17 ENCOUNTER — Telehealth: Payer: Self-pay | Admitting: Licensed Clinical Social Worker

## 2023-09-17 NOTE — Telephone Encounter (Signed)
 H&V Care Navigation CSW Progress Note  Clinical Social Worker  received call from pt [phone])  to share that he received several letters from [employer] and he is not sure what to do since they seem contradictory. Pt concerned also as it noted he was using multiple names for benefits- states he needs to speak with [place]- I clarify that he works with Lawson Fiscal for disability, she may have a person named [place] on her team but should contact Lori at [facility] as may be related to his claim. Also encouraged him if concerned with security of his benefits etc that he needs to call or go to [employer] office with documents showing proof of identity etc to try and remedy any concerns. Pt states understanding. Also sent f/u email securely to [facility] with [facility].   Patient is participating in a Managed Medicaid Plan:  No, Medicaid of [place]  SDOH Screenings   Food Insecurity: No Food Insecurity (09/03/2023)  Housing: Low Risk  (09/03/2023)  Transportation Needs: No Transportation Needs (09/03/2023)  Utilities: Not At Risk (09/03/2023)  Depression (PHQ2-9): Low Risk  (08/03/2023)  Financial Resource Strain: Low Risk  (04/20/2023)  Tobacco Use: Low Risk  (09/02/2023)  Health Literacy: Adequate Health Literacy (04/20/2023)    Octavio Graves, MSW, LCSW Clinical Social Worker II [facility] Health Heart/Vascular Care Navigation  [phone]- work cell phone (preferred) [phone]- desk phone

## 2023-10-01 ENCOUNTER — Telehealth: Payer: Self-pay

## 2023-10-01 NOTE — Telephone Encounter (Signed)
 Pt was left sample of Xarelto  20 mg tablet. Pt never pick up sample and it was restock to the sample closet.   Lot#  [id number]  Exp# 02/2025
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
## 2023-10-21 ENCOUNTER — Encounter: Payer: Self-pay | Admitting: General Practice

## 2023-10-21 ENCOUNTER — Ambulatory Visit: Payer: PRIVATE HEALTH INSURANCE | Attending: General Practice | Admitting: General Practice

## 2023-10-21 VITALS — BP 124/88 | HR 93 | Ht 75.0 in | Wt 263.4 lb

## 2023-10-21 DIAGNOSIS — I5020 Unspecified systolic (congestive) heart failure: Secondary | ICD-10-CM | POA: Diagnosis not present

## 2023-10-21 DIAGNOSIS — I10 Essential (primary) hypertension: Secondary | ICD-10-CM | POA: Diagnosis not present

## 2023-10-21 DIAGNOSIS — I4891 Unspecified atrial fibrillation: Secondary | ICD-10-CM | POA: Diagnosis not present

## 2023-10-21 DIAGNOSIS — S56521D Laceration of other extensor muscle, fascia and tendon at forearm level, right arm, subsequent encounter: Secondary | ICD-10-CM | POA: Diagnosis not present

## 2023-10-21 NOTE — Patient Instructions (Addendum)
 Medication Instructions:  The current medical regimen is effective;  continue present plan and medications as directed. Please refer to the Current Medication list given to you today.  *If you need a refill on your cardiac medications before your next appointment, please call your pharmacy*  Lab Work: NONE  Testing/Procedures: Your physician has requested that you have an echocardiogram. Echocardiography is a painless test that uses sound waves to create images of your heart. It provides your doctor with information about the size and shape of your heart and how well your heart's chambers and valves are working. This procedure takes approximately one hour. There are no restrictions for this procedure. Please do NOT wear cologne, perfume, aftershave, or lotions (deodorant is allowed). Please arrive 15 minutes prior to your appointment time.  Please note: We ask at that you not bring children with you during ultrasound (echo/ vascular) testing. Due to room size and safety concerns, children are not allowed in the ultrasound rooms during exams. Our front office staff cannot provide observation of children in our lobby area while testing is being conducted. An adult accompanying a patient to their appointment will only be allowed in the ultrasound room at the discretion of the ultrasound technician under special circumstances. We apologize for any inconvenience.   Follow-Up: At [facility], you and your health needs are our priority.  As part of our continuing mission to provide you with exceptional heart care, our providers are all part of one team.  This team includes your primary Cardiologist (physician) and Advanced Practice Providers or APPs (Physician Assistants and Nurse Practitioners) who all work together to provide you with the care you need, when you need it.  Your next appointment:   4-6 month(s)  Provider:   Luana Rumple, MD    We recommend signing up for the patient  portal called "MyChart".  Sign up information is provided on this After Visit Summary.  MyChart is used to connect with patients for Virtual Visits (Telemedicine).  Patients are able to view lab/test results, encounter notes, upcoming appointments, etc.  Non-urgent messages can be sent to your provider as well.   To learn more about what you can do with MyChart, go to [email or web address].   Other Instructions CONTINUE LOW SALT DIET MONITOR WEIGHT  MAINTAIN YOUR PHYSICAL ACTIVITY

## 2023-10-29 ENCOUNTER — Telehealth (HOSPITAL_BASED_OUTPATIENT_CLINIC_OR_DEPARTMENT_OTHER): Payer: Self-pay | Admitting: Licensed Clinical Social Worker

## 2023-10-29 NOTE — Telephone Encounter (Signed)
 H&V Care Navigation CSW Progress Note  Clinical Social Worker received message [phone] from pt inquiring as to who is his PCP is and clinic telephone number. Let him know it is Folshade Paseda, Patient [facility], [phone].   Patient is participating in a Managed Medicaid Plan:  No, Medicaid non managed  SDOH Screenings   Food Insecurity: No Food Insecurity (09/03/2023)  Housing: Low Risk  (09/03/2023)  Transportation Needs: No Transportation Needs (09/03/2023)  Utilities: Not At Risk (09/03/2023)  Depression (PHQ2-9): Low Risk  (08/03/2023)  Financial Resource Strain: Low Risk  (04/20/2023)  Tobacco Use: Low Risk  (10/21/2023)  Health Literacy: Adequate Health Literacy (04/20/2023)    Nathen Balder, MSW, LCSW Clinical Social Worker II [facility] Heart/Vascular Care Navigation  [phone]- [phone] cell phone (preferred)

## 2023-10-30 ENCOUNTER — Ambulatory Visit: Payer: Self-pay | Admitting: Nurse Practitioner

## 2023-10-30 ENCOUNTER — Inpatient Hospital Stay (HOSPITAL_COMMUNITY)
Admission: EM | Admit: 2023-10-30 | Discharge: 2023-11-01 | DRG: 389 | Discharge status: Against Medical Advice | Attending: Internal Medicine | Admitting: Internal Medicine

## 2023-10-30 ENCOUNTER — Other Ambulatory Visit: Payer: Self-pay

## 2023-10-30 ENCOUNTER — Ambulatory Visit (INDEPENDENT_AMBULATORY_CARE_PROVIDER_SITE_OTHER): Admitting: Nurse Practitioner

## 2023-10-30 ENCOUNTER — Emergency Department (HOSPITAL_COMMUNITY)

## 2023-10-30 ENCOUNTER — Encounter (HOSPITAL_COMMUNITY): Payer: Self-pay

## 2023-10-30 VITALS — BP 143/104 | HR 75

## 2023-10-30 DIAGNOSIS — R112 Nausea with vomiting, unspecified: Secondary | ICD-10-CM | POA: Insufficient documentation

## 2023-10-30 DIAGNOSIS — I5042 Chronic combined systolic (congestive) and diastolic (congestive) heart failure: Secondary | ICD-10-CM | POA: Diagnosis present

## 2023-10-30 DIAGNOSIS — N1831 Chronic kidney disease, stage 3a: Secondary | ICD-10-CM | POA: Diagnosis present

## 2023-10-30 DIAGNOSIS — I4821 Permanent atrial fibrillation: Secondary | ICD-10-CM | POA: Diagnosis present

## 2023-10-30 DIAGNOSIS — Z7901 Long term (current) use of anticoagulants: Secondary | ICD-10-CM

## 2023-10-30 DIAGNOSIS — Z7984 Long term (current) use of oral hypoglycemic drugs: Secondary | ICD-10-CM

## 2023-10-30 DIAGNOSIS — E66811 Obesity, class 1: Secondary | ICD-10-CM | POA: Insufficient documentation

## 2023-10-30 DIAGNOSIS — Z79899 Other long term (current) drug therapy: Secondary | ICD-10-CM | POA: Diagnosis not present

## 2023-10-30 DIAGNOSIS — Z833 Family history of diabetes mellitus: Secondary | ICD-10-CM

## 2023-10-30 DIAGNOSIS — I5021 Acute systolic (congestive) heart failure: Secondary | ICD-10-CM | POA: Diagnosis not present

## 2023-10-30 DIAGNOSIS — Z8673 Personal history of transient ischemic attack (TIA), and cerebral infarction without residual deficits: Secondary | ICD-10-CM

## 2023-10-30 DIAGNOSIS — I10 Essential (primary) hypertension: Secondary | ICD-10-CM | POA: Diagnosis present

## 2023-10-30 DIAGNOSIS — I428 Other cardiomyopathies: Secondary | ICD-10-CM | POA: Diagnosis present

## 2023-10-30 DIAGNOSIS — Z8701 Personal history of pneumonia (recurrent): Secondary | ICD-10-CM | POA: Diagnosis not present

## 2023-10-30 DIAGNOSIS — Z8679 Personal history of other diseases of the circulatory system: Secondary | ICD-10-CM | POA: Diagnosis not present

## 2023-10-30 DIAGNOSIS — Z01810 Encounter for preprocedural cardiovascular examination: Secondary | ICD-10-CM | POA: Diagnosis not present

## 2023-10-30 DIAGNOSIS — Z5321 Procedure and treatment not carried out due to patient leaving prior to being seen by health care provider: Secondary | ICD-10-CM | POA: Diagnosis present

## 2023-10-30 DIAGNOSIS — R7989 Other specified abnormal findings of blood chemistry: Secondary | ICD-10-CM | POA: Diagnosis not present

## 2023-10-30 DIAGNOSIS — I130 Hypertensive heart and chronic kidney disease with heart failure and stage 1 through stage 4 chronic kidney disease, or unspecified chronic kidney disease: Secondary | ICD-10-CM | POA: Diagnosis present

## 2023-10-30 DIAGNOSIS — K56609 Unspecified intestinal obstruction, unspecified as to partial versus complete obstruction: Secondary | ICD-10-CM | POA: Diagnosis present

## 2023-10-30 DIAGNOSIS — I480 Paroxysmal atrial fibrillation: Secondary | ICD-10-CM | POA: Diagnosis present

## 2023-10-30 DIAGNOSIS — R1084 Generalized abdominal pain: Secondary | ICD-10-CM | POA: Diagnosis not present

## 2023-10-30 DIAGNOSIS — Z952 Presence of prosthetic heart valve: Secondary | ICD-10-CM | POA: Diagnosis not present

## 2023-10-30 DIAGNOSIS — I4891 Unspecified atrial fibrillation: Secondary | ICD-10-CM | POA: Diagnosis present

## 2023-10-30 DIAGNOSIS — Z6833 Body mass index (BMI) 33.0-33.9, adult: Secondary | ICD-10-CM

## 2023-10-30 DIAGNOSIS — Z8774 Personal history of (corrected) congenital malformations of heart and circulatory system: Secondary | ICD-10-CM

## 2023-10-30 DIAGNOSIS — Z91199 Patient's noncompliance with other medical treatment and regimen due to unspecified reason: Secondary | ICD-10-CM | POA: Diagnosis not present

## 2023-10-30 DIAGNOSIS — E785 Hyperlipidemia, unspecified: Secondary | ICD-10-CM | POA: Diagnosis present

## 2023-10-30 DIAGNOSIS — Z8249 Family history of ischemic heart disease and other diseases of the circulatory system: Secondary | ICD-10-CM | POA: Diagnosis not present

## 2023-10-30 LAB — COMPREHENSIVE METABOLIC PANEL WITH GFR
ALT: 20 U/L (ref 0–44)
AST: 29 U/L (ref 15–41)
Albumin: 3.9 g/dL (ref 3.5–5.0)
Alkaline Phosphatase: 50 U/L (ref 38–126)
Anion gap: 8 (ref 5–15)
BUN: 25 mg/dL — ABNORMAL HIGH (ref 8–23)
CO2: 30 mmol/L (ref 22–32)
Calcium: 9.4 mg/dL (ref 8.9–10.3)
Chloride: 95 mmol/L — ABNORMAL LOW (ref 98–111)
Creatinine, Ser: 1.04 mg/dL (ref 0.61–1.24)
GFR, Estimated: >60 mL/min (ref >60)
Glucose, Bld: 121 mg/dL — ABNORMAL HIGH (ref 70–99)
Potassium: 3.6 mmol/L (ref 3.5–5.1)
Sodium: 133 mmol/L — ABNORMAL LOW (ref 135–145)
Total Bilirubin: 1.9 mg/dL — ABNORMAL HIGH (ref 0.0–1.2)
Total Protein: 8.2 g/dL — ABNORMAL HIGH (ref 6.5–8.1)

## 2023-10-30 LAB — POCT I-STAT, CHEM 8
BUN: 27 mg/dL — ABNORMAL HIGH (ref 8–23)
Calcium, Ion: 1.06 mmol/L — ABNORMAL LOW (ref 1.15–1.40)
Chloride: 96 mmol/L — ABNORMAL LOW (ref 98–111)
Creatinine, Ser: 1.1 mg/dL (ref 0.61–1.24)
Glucose, Bld: 127 mg/dL — ABNORMAL HIGH (ref 70–99)
HCT: 50 % (ref 39.0–52.0)
Hemoglobin: 17 g/dL (ref 13.0–17.0)
Potassium: 3.5 mmol/L (ref 3.5–5.1)
Sodium: 135 mmol/L (ref 135–145)
TCO2: 29 mmol/L (ref 22–32)

## 2023-10-30 LAB — CG4 I-STAT (LACTIC ACID)
Lactic Acid, Venous: 1.9 mmol/L (ref 0.5–1.9)
Lactic Acid, Venous: 2 mmol/L (ref 0.5–1.9)

## 2023-10-30 LAB — URINALYSIS, ROUTINE W REFLEX MICROSCOPIC
Bacteria, UA: NONE SEEN
Bilirubin Urine: NEGATIVE
Glucose, UA: >=500 mg/dL — AB
Hgb urine dipstick: NEGATIVE
Ketones, ur: NEGATIVE mg/dL
Leukocytes,Ua: NEGATIVE
Nitrite: NEGATIVE
Protein, ur: 100 mg/dL — AB
Specific Gravity, Urine: 1.029 (ref 1.005–1.030)
pH: 5 (ref 5.0–8.0)

## 2023-10-30 LAB — LIPASE, BLOOD: Lipase: 42 U/L (ref 11–51)

## 2023-10-30 LAB — CBC
HCT: 52.9 % — ABNORMAL HIGH (ref 39.0–52.0)
Hemoglobin: 16.4 g/dL (ref 13.0–17.0)
MCH: 25 pg — ABNORMAL LOW (ref 26.0–34.0)
MCHC: 31 g/dL (ref 30.0–36.0)
MCV: 80.6 fL (ref 80.0–100.0)
Platelets: 233 10*3/uL (ref 150–400)
RBC: 6.56 MIL/uL — ABNORMAL HIGH (ref 4.22–5.81)
RDW: 17.2 % — ABNORMAL HIGH (ref 11.5–15.5)
WBC: 11.2 10*3/uL — ABNORMAL HIGH (ref 4.0–10.5)
nRBC: 0 % (ref 0.0–0.2)

## 2023-10-30 LAB — PROTIME-INR
INR: 1.4 — ABNORMAL HIGH (ref 0.8–1.2)
Prothrombin Time: 17.6 s — ABNORMAL HIGH (ref 11.4–15.2)

## 2023-10-30 LAB — TROPONIN I (HIGH SENSITIVITY)
Troponin I (High Sensitivity): 24 ng/L — ABNORMAL HIGH (ref <18)
Troponin I (High Sensitivity): 27 ng/L — ABNORMAL HIGH (ref <18)

## 2023-10-30 LAB — APTT: aPTT: 28 s (ref 24–36)

## 2023-10-30 LAB — HEPARIN LEVEL (UNFRACTIONATED): Heparin Unfractionated: >1.1 [IU]/mL — ABNORMAL HIGH (ref 0.30–0.70)

## 2023-10-30 LAB — LACTIC ACID, PLASMA: Lactic Acid, Venous: 1.3 mmol/L (ref 0.5–1.9)

## 2023-10-30 MED ORDER — MENTHOL 3 MG MT LOZG: 1.0000 | LOZENGE | OROMUCOSAL | Status: DC | PRN

## 2023-10-30 MED ORDER — HYDROMORPHONE HCL 1 MG/ML IJ SOLN
0.5000 mg | Freq: Once | INTRAMUSCULAR | Status: AC
  Administered 2023-10-30: 0.5 mg via INTRAVENOUS
  Filled 2023-10-30: qty 1

## 2023-10-30 MED ORDER — PHENOL 1.4 % MT LIQD: 2.0000 | OROMUCOSAL | Status: DC | PRN

## 2023-10-30 MED ORDER — PROCHLORPERAZINE EDISYLATE 10 MG/2ML IJ SOLN
5.0000 mg | INTRAMUSCULAR | Status: DC | PRN
  Administered 2023-10-30 – 2023-11-01 (×2): 10 mg via INTRAVENOUS
  Filled 2023-10-30 (×2): qty 2

## 2023-10-30 MED ORDER — LACTATED RINGERS IV BOLUS
1000.0000 mL | Freq: Once | INTRAVENOUS | Status: DC
  Administered 2023-10-30: 1000 mL via INTRAVENOUS

## 2023-10-30 MED ORDER — MAGIC MOUTHWASH: 15.0000 mL | Freq: Four times a day (QID) | ORAL | Status: DC | PRN

## 2023-10-30 MED ORDER — HEPARIN (PORCINE) 25000 UT/250ML-% IV SOLN
2100.0000 [IU]/h | INTRAVENOUS | Status: DC
  Administered 2023-10-30: 1600 [IU]/h via INTRAVENOUS
  Administered 2023-10-31: 2100 [IU]/h via INTRAVENOUS
  Filled 2023-10-30 (×3): qty 250

## 2023-10-30 MED ORDER — METHOCARBAMOL 1000 MG/10ML IJ SOLN: 1000.0000 mg | Freq: Four times a day (QID) | INTRAMUSCULAR | Status: DC | PRN

## 2023-10-30 MED ORDER — BISACODYL 10 MG RE SUPP
10.0000 mg | Freq: Every day | RECTAL | Status: DC
  Administered 2023-10-30: 10 mg via RECTAL
  Filled 2023-10-30 (×2): qty 1

## 2023-10-30 MED ORDER — SODIUM CHLORIDE 0.9 % IV SOLN: INTRAVENOUS | Status: AC

## 2023-10-30 MED ORDER — HYDROMORPHONE HCL 1 MG/ML IJ SOLN
0.5000 mg | INTRAMUSCULAR | Status: DC | PRN
  Administered 2023-10-30: 1 mg via INTRAVENOUS
  Administered 2023-10-30 – 2023-11-01 (×5): 2 mg via INTRAVENOUS
  Filled 2023-10-30 (×3): qty 2
  Filled 2023-10-30: qty 1
  Filled 2023-10-30 (×2): qty 2

## 2023-10-30 MED ORDER — METOPROLOL TARTRATE 5 MG/5ML IV SOLN: 5.0000 mg | Freq: Four times a day (QID) | INTRAVENOUS | Status: DC | PRN

## 2023-10-30 MED ORDER — SIMETHICONE 40 MG/0.6ML PO SUSP: 80.0000 mg | Freq: Four times a day (QID) | ORAL | Status: DC | PRN

## 2023-10-30 MED ORDER — ACETAMINOPHEN 650 MG RE SUPP: 650.0000 mg | Freq: Four times a day (QID) | RECTAL | Status: DC | PRN

## 2023-10-30 MED ORDER — LACTATED RINGERS IV BOLUS: 1000.0000 mL | Freq: Three times a day (TID) | INTRAVENOUS | Status: DC | PRN

## 2023-10-30 MED ORDER — DIPHENHYDRAMINE HCL 50 MG/ML IJ SOLN: 12.5000 mg | Freq: Four times a day (QID) | INTRAMUSCULAR | Status: DC | PRN

## 2023-10-30 MED ORDER — HEPARIN (PORCINE) 25000 UT/250ML-% IV SOLN: 1800.0000 [IU]/h | INTRAVENOUS | Status: DC

## 2023-10-30 MED ORDER — ONDANSETRON HCL 4 MG/2ML IJ SOLN: 4.0000 mg | Freq: Four times a day (QID) | INTRAMUSCULAR | Status: DC | PRN

## 2023-10-30 MED ORDER — SODIUM CHLORIDE 0.9 % IV BOLUS
1000.0000 mL | Freq: Once | INTRAVENOUS | Status: AC
  Administered 2023-10-30: 1000 mL via INTRAVENOUS

## 2023-10-30 MED ORDER — ALUM & MAG HYDROXIDE-SIMETH 200-200-20 MG/5ML PO SUSP: 30.0000 mL | Freq: Four times a day (QID) | ORAL | Status: DC | PRN

## 2023-10-30 MED ORDER — DIATRIZOATE MEGLUMINE & SODIUM 66-10 % PO SOLN
90.0000 mL | Freq: Once | ORAL | Status: AC
  Administered 2023-10-30: 90 mL via NASOGASTRIC
  Filled 2023-10-30: qty 90

## 2023-10-30 MED ORDER — SALINE SPRAY 0.65 % NA SOLN: 1.0000 | Freq: Four times a day (QID) | NASAL | Status: DC | PRN

## 2023-10-30 MED ORDER — IOHEXOL 300 MG/ML  SOLN
100.0000 mL | Freq: Once | INTRAMUSCULAR | Status: AC | PRN
  Administered 2023-10-30: 100 mL via INTRAVENOUS

## 2023-10-30 MED ORDER — ONDANSETRON HCL 4 MG/2ML IJ SOLN
4.0000 mg | Freq: Once | INTRAMUSCULAR | Status: AC
  Administered 2023-10-30: 4 mg via INTRAVENOUS
  Filled 2023-10-30: qty 2

## 2023-10-30 MED ORDER — NAPHAZOLINE-GLYCERIN 0.012-0.25 % OP SOLN: 1.0000 [drp] | Freq: Four times a day (QID) | OPHTHALMIC | Status: DC | PRN
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
## 2023-10-30 NOTE — ED Notes (Signed)
 NG tube successfully inserted c help of patty rn

## 2023-10-30 NOTE — ED Provider Notes (Signed)
 [place] EMERGENCY DEPARTMENT AT [facility] Provider Note   CSN: [id number] Arrival date & time: 10/30/23  1034     History  Chief Complaint  Patient presents with   Abdominal Pain    Caleb [name] is a 63 y.o. male who presents today with a sudden onset of upper abdominal pain that began at approximately 1 AM today.  He states the pain feels like intermittent cramping type pain that does not radiate and is localized to the left and right upper quadrants with the right upper quadrant greater than the left.  Further he has vomited 3 times without any noted hematemesis, he denies any loose stools stating that his last bowel movement was midday yesterday and was normal.  Prior to going to sleep last night the patient felt well and had no pain or discomfort of note.  Of note he does have a previous medical history of AAA, CHF, as well as hypertension.  Abdominal Pain Associated symptoms: nausea and vomiting   Associated symptoms: no chest pain, no chills, no constipation, no diarrhea and no fever        Home Medications Prior to Admission medications   Medication Sig Start Date End Date Taking? Authorizing Provider  Ascorbic Acid (VITAMIN C PO) Take 1 tablet by mouth daily.    [provider]  empagliflozin  (JARDIANCE ) 10 MG TABS tablet Take 1 tablet (10 mg total) by mouth daily. 04/13/23   Croitoru, Mihai, MD  L-ARGININE PO Take 1 tablet by mouth daily.    [provider]  NON FORMULARY Take 1 capsule by mouth See admin instructions. Sea Moss- Take 1 capsule by mouth once a day    [provider]  potassium chloride  SA (KLOR-CON  M) 20 MEQ tablet Take 1 tablet (20 mEq total) by mouth daily. 05/14/23   Croitoru, Mihai, MD  rivaroxaban  (XARELTO ) 20 MG TABS tablet Take 1 tablet (20 mg total) by mouth daily with supper. 04/13/23   Croitoru, Mihai, MD  rosuvastatin  (CRESTOR ) 20 MG tablet Take 1 tablet (20 mg total) by mouth daily. 05/14/23   Croitoru,  Mihai, MD  torsemide  (DEMADEX ) 20 MG tablet Take 1 tablet (20 mg total) by mouth daily. 05/14/23   Croitoru, Mihai, MD  Vitamin D-Vitamin K (VITAMIN K2-VITAMIN D3 PO) Take 1 tablet by mouth daily.    [provider]  zinc gluconate 50 MG tablet Take 50 mg by mouth daily.    [provider]      Allergies    Patient has no known allergies.    Review of Systems   Review of Systems  Constitutional:  Negative for chills and fever.  Cardiovascular:  Negative for chest pain and leg swelling.  Gastrointestinal:  Positive for abdominal pain, nausea and vomiting. Negative for abdominal distention, blood in stool, constipation and diarrhea.  Genitourinary:  Negative for flank pain.  All other systems reviewed and are negative.   Physical Exam Updated Vital Signs BP (!) 139/109   Pulse 79   Temp 97.9 F (36.6 C) (Oral)   Resp 16   Ht 6\' 3"  (1.905 m)   Wt 120 kg   SpO2 98%   BMI 33.07 kg/m  Physical Exam Vitals and nursing note reviewed.  Constitutional:      General: He is awake. He is not in acute distress.    Appearance: Normal appearance. He is ill-appearing. He is not diaphoretic.  HENT:     Head: Normocephalic and atraumatic.  Mouth/Throat:     Mouth: Mucous membranes are moist.     Pharynx: Oropharynx is clear.  Eyes:     Extraocular Movements: Extraocular movements intact.     Conjunctiva/sclera: Conjunctivae normal.     Pupils: Pupils are equal, round, and reactive to light.  Cardiovascular:     Rate and Rhythm: Normal rate and regular rhythm.     Pulses: Normal pulses.     Heart sounds: Normal heart sounds. No murmur heard.    No friction rub. No gallop.  Pulmonary:     Effort: Pulmonary effort is normal.     Breath sounds: Normal breath sounds.  Abdominal:     General: Abdomen is flat. Bowel sounds are normal. There is no distension or abdominal bruit.     Palpations: Abdomen is soft. There is no hepatomegaly, splenomegaly, mass or pulsatile  mass.     Tenderness: There is abdominal tenderness in the right upper quadrant, epigastric area and left upper quadrant. Negative signs include Murphy's sign and McBurney's sign.     Comments: Tenderness of the upper abdomen is greater on the right than the left.  Musculoskeletal:        General: Normal range of motion.     Cervical back: Normal range of motion and neck supple.     Right lower leg: No edema.     Left lower leg: No edema.  Skin:    General: Skin is warm and dry.     Capillary Refill: Capillary refill takes less than 2 seconds.  Neurological:     General: No focal deficit present.     Mental Status: He is alert. Mental status is at baseline.  Psychiatric:        Mood and Affect: Mood normal.        Behavior: Behavior is cooperative.     ED Results / Procedures / Treatments   Labs (all labs ordered are listed, but only abnormal results are displayed) Labs Reviewed  COMPREHENSIVE METABOLIC PANEL WITH GFR - Abnormal; Notable for the following components:      Result Value   Sodium 133 (*)    Chloride 95 (*)    Glucose, Bld 121 (*)    BUN 25 (*)    Total Protein 8.2 (*)    Total Bilirubin 1.9 (*)    All other components within normal limits  CBC - Abnormal; Notable for the following components:   WBC 11.2 (*)    RBC 6.56 (*)    HCT 52.9 (*)    MCH 25.0 (*)    RDW 17.2 (*)    All other components within normal limits  TROPONIN I (HIGH SENSITIVITY) - Abnormal; Notable for the following components:   Troponin I (High Sensitivity) 24 (*)    All other components within normal limits  LIPASE, BLOOD  URINALYSIS, ROUTINE W REFLEX MICROSCOPIC  I-STAT CG4 LACTIC ACID, ED  I-STAT CHEM 8, ED  TROPONIN I (HIGH SENSITIVITY)    EKG None  Radiology CT ABDOMEN PELVIS W CONTRAST Result Date: 10/30/2023 CLINICAL DATA:  Abdominal pain, acute, nonlocalized, vomiting. EXAM: CT ABDOMEN AND PELVIS WITH CONTRAST TECHNIQUE: Multidetector CT imaging of the abdomen and pelvis  was performed using the standard protocol following bolus administration of intravenous contrast. RADIATION DOSE REDUCTION: This exam was performed according to the departmental dose-optimization program which includes automated exposure control, adjustment of the mA and/or kV according to patient size and/or use of iterative reconstruction technique. CONTRAST:  OMNIPAQUE  IOHEXOL  300  MG/ML  SOLN COMPARISON:  Oct 30, 2023, June 30, 2023 FINDINGS: Lower chest: Subsegmental atelectasis in the left lower lobe. Posterior bibasilar dependent atelectasis.Moderate cardiomegaly. Hepatobiliary: No mass.A couple of small radiopaque gallstones in the gallbladder neck. No wall thickening or pericholecystic inflammation. No intrahepatic or extrahepatic biliary ductal dilation. Pancreas: No mass or main ductal dilation. No peripancreatic inflammation or fluid collection. Spleen: Normal size. No mass. Adrenals/Urinary Tract: No adrenal masses. No renal mass. No nephrolithiasis or hydronephrosis. The urinary bladder is distended without focal abnormality. Stomach/Bowel: Moderately distended stomach with ingested material present. Abnormal fluid-filled dilation of multiple segments of small bowel in the mid abdomen measuring up to 4 cm. Abrupt transition point with small bowel feces sign in the mid abdomen (axial 46). The downstream small bowel is completely decompressed. No small bowel wall thickening or inflammation. No small bowel obstruction.Normal appendix. Descending colonic diverticulosis. No changes of acute diverticulitis. Vascular/Lymphatic: No aortic aneurysm. Scattered aortoiliac atherosclerosis. No intraabdominal or pelvic lymphadenopathy. Reproductive: Mild prostatomegaly.Small volume free pelvic fluid, likely reactive. Other: No pneumoperitoneum.  Mild, focal mesenteric edema. Musculoskeletal: No acute fracture or destructive lesion. Multilevel degenerative disc disease of the spine. Thoracic DISH. IMPRESSION:  1. Findings consistent with a small-bowel obstruction. Abrupt transition point with small bowel feces sign in the mid small bowel, possibly due to bowel adhesions. Mild mesenteric edema and small volume reactive free fluid in the pelvis. No pneumatosis or portal venous gas. 2. Cholecystolithiasis. No changes of acute cholecystitis. 3. Mild prostatomegaly. 4. Moderate cardiomegaly. Electronically Signed   By: Rance Burrows M.D.   On: 10/30/2023 14:16   DG Abd Acute W/Chest Result Date: 10/30/2023 CLINICAL DATA:  upper abdominal painOctober 3, 2024 EXAM: DG ABDOMEN ACUTE WITH 1 VIEW CHEST COMPARISON:  None Available. FINDINGS: Abnormal gaseous distension of a few segments of small bowel in the upper abdomen measuring up to 4 cm. No significant small bowel gas in the lower abdomen. Minimal gas in the right colon. No pneumoperitoneum. No organomegaly or radiopaque calculi. No acute fracture or destructive lesion. No focal airspace consolidation, pleural effusion, or pneumothorax.Moderate cardiomegaly. Aortic valve replacement and sternotomy wires. Tortuous aorta. Multilevel degenerative disc disease of the spine. IMPRESSION: Findings worrisome for a small bowel obstruction. A follow-up CT of the abdomen and pelvis has already been ordered at the time of this dictation, please see that report for further characterization. Electronically Signed   By: Rance Burrows M.D.   On: 10/30/2023 13:59    Procedures Procedures    Medications Ordered in ED Medications  sodium chloride  0.9 % bolus 1,000 mL (0 mLs Intravenous Stopped 10/30/23 1405)    And  0.9 %  sodium chloride  infusion ( Intravenous New Bag/Given 10/30/23 1330)  HYDROmorphone  (DILAUDID ) injection 0.5 mg (0.5 mg Intravenous Given 10/30/23 1209)  ondansetron  (ZOFRAN ) injection 4 mg (4 mg Intravenous Given 10/30/23 1209)  iohexol  (OMNIPAQUE ) 300 MG/ML solution 100 mL (100 mLs Intravenous Contrast Given 10/30/23 1232)    ED Course/ Medical Decision  Making/ A&P                                 Medical Decision Making Amount and/or Complexity of Data Reviewed Labs: ordered. Radiology: ordered.  Risk Prescription drug management. Decision regarding hospitalization.   Medical Decision Making:   MANSOUR BALBOA is a 63 y.o. male who presented to the ED today with upper abdominal pain detailed above.     Complete initial  physical exam performed, notably the patient  was alert and oriented and not in apparent distress but obviously ill-appearing.  He states that he must lay on his left side which helps relieve his pain.  Palpation of the abdomen shows point tenderness to the right and left upper quadrants with the right being greater than the left.  There are no palpable masses, no pulsatile masses noted, Murphy sign negative.     Reviewed and confirmed nursing documentation for past medical history, family history, social history.    Initial Assessment:   With the patient's presentation of upper abdominal pain, most likely diagnosis is acute biliary process or acute pancreatitis. Other diagnoses were considered including (but not limited to) acute mesenteric ischemia, dissecting aortic aneurysm. These are considered less likely due to history of present illness and physical exam findings.     Initial Plan:  Obtain upright abdominal plain film along with CT imaging of the abdomen to assess for acute abdominal pathology. Screening labs including CBC and Metabolic panel to evaluate for infectious or metabolic etiology of disease.  Obtain serum lipase to assess for acute pancreatic pathology Urinalysis with reflex culture ordered to evaluate for UTI or relevant urologic/nephrologic pathology.  EKG and serial troponin to evaluate for cardiac pathology. Objective evaluation as below reviewed   Initial Study Results:   Laboratory  All laboratory results reviewed without evidence of clinically relevant pathology.   Exceptions include:  Leukocytosis 11.2.  Elevated troponin of 24.  EKG EKG was reviewed independently. Rate, rhythm, axis, intervals all examined and without medically relevant abnormality. ST segments without concerns for elevations.    Radiology:  All images reviewed independently. Agree with radiology report at this time.   CT ABDOMEN PELVIS W CONTRAST Result Date: 10/30/2023 CLINICAL DATA:  Abdominal pain, acute, nonlocalized, vomiting. EXAM: CT ABDOMEN AND PELVIS WITH CONTRAST TECHNIQUE: Multidetector CT imaging of the abdomen and pelvis was performed using the standard protocol following bolus administration of intravenous contrast. RADIATION DOSE REDUCTION: This exam was performed according to the departmental dose-optimization program which includes automated exposure control, adjustment of the mA and/or kV according to patient size and/or use of iterative reconstruction technique. CONTRAST:  OMNIPAQUE  IOHEXOL  300 MG/ML  SOLN COMPARISON:  Oct 30, 2023, June 30, 2023 FINDINGS: Lower chest: Subsegmental atelectasis in the left lower lobe. Posterior bibasilar dependent atelectasis.Moderate cardiomegaly. Hepatobiliary: No mass.A couple of small radiopaque gallstones in the gallbladder neck. No wall thickening or pericholecystic inflammation. No intrahepatic or extrahepatic biliary ductal dilation. Pancreas: No mass or main ductal dilation. No peripancreatic inflammation or fluid collection. Spleen: Normal size. No mass. Adrenals/Urinary Tract: No adrenal masses. No renal mass. No nephrolithiasis or hydronephrosis. The urinary bladder is distended without focal abnormality. Stomach/Bowel: Moderately distended stomach with ingested material present. Abnormal fluid-filled dilation of multiple segments of small bowel in the mid abdomen measuring up to 4 cm. Abrupt transition point with small bowel feces sign in the mid abdomen (axial 46). The downstream small bowel is completely decompressed. No small bowel wall  thickening or inflammation. No small bowel obstruction.Normal appendix. Descending colonic diverticulosis. No changes of acute diverticulitis. Vascular/Lymphatic: No aortic aneurysm. Scattered aortoiliac atherosclerosis. No intraabdominal or pelvic lymphadenopathy. Reproductive: Mild prostatomegaly.Small volume free pelvic fluid, likely reactive. Other: No pneumoperitoneum.  Mild, focal mesenteric edema. Musculoskeletal: No acute fracture or destructive lesion. Multilevel degenerative disc disease of the spine. Thoracic DISH. IMPRESSION: 1. Findings consistent with a small-bowel obstruction. Abrupt transition point with small bowel feces sign in the mid small  bowel, possibly due to bowel adhesions. Mild mesenteric edema and small volume reactive free fluid in the pelvis. No pneumatosis or portal venous gas. 2. Cholecystolithiasis. No changes of acute cholecystitis. 3. Mild prostatomegaly. 4. Moderate cardiomegaly. Electronically Signed   By: Rance Burrows M.D.   On: 10/30/2023 14:16   DG Abd Acute W/Chest Result Date: 10/30/2023 CLINICAL DATA:  upper abdominal painOctober 3, 2024 EXAM: DG ABDOMEN ACUTE WITH 1 VIEW CHEST COMPARISON:  None Available. FINDINGS: Abnormal gaseous distension of a few segments of small bowel in the upper abdomen measuring up to 4 cm. No significant small bowel gas in the lower abdomen. Minimal gas in the right colon. No pneumoperitoneum. No organomegaly or radiopaque calculi. No acute fracture or destructive lesion. No focal airspace consolidation, pleural effusion, or pneumothorax.Moderate cardiomegaly. Aortic valve replacement and sternotomy wires. Tortuous aorta. Multilevel degenerative disc disease of the spine. IMPRESSION: Findings worrisome for a small bowel obstruction. A follow-up CT of the abdomen and pelvis has already been ordered at the time of this dictation, please see that report for further characterization. Electronically Signed   By: Rance Burrows M.D.   On:  10/30/2023 13:59      Consults: Case discussed with general surgery..   Reassessment and Plan:   Given physical exam findings extensive workup including imaging, likely etiology of this patient's condition is small bowel obstruction.  As such patient will require inpatient admission for definitive management.  Have placed orders for patient to have NG tube placed, and consultation with inpatient general surgery has been made.  At time of reassessment the patient states that his pain is adequately managed at this time and does not request any further pain management.  Further he does not have any further nausea and is not requesting any new antiemetics.  He is continuing to get IV fluids as noted.  This with the patient need for admission.  He is in agreement with this and consents to consultation for inpatient admission.          Final Clinical Impression(s) / ED Diagnoses Final diagnoses:  None    Rx / DC Orders ED Discharge Orders     None         Juanetta Nordmann, PA 10/30/23 1440    Deatra Face, MD 10/31/23 1355

## 2023-10-30 NOTE — Assessment & Plan Note (Addendum)
 On admission. -echo in 2024 showing LVEF less than 20%, severely decreased LV function, indeterminate diastolic parameters, severely dilated LA and RA, moderately reduced RV systolic function. -euvolemic on exam  -resume Toprol -XL 25 mg daily, Jardiance  10 mg daily, torsemide  20 mg daily when back to PO intake  -strict I/O and daily weights   10-31-2023 cardiology consulted. Echo obtained.   11-01-2023 echo shows slightly improved LVEF now of 25-30%. Stop IV lopressor . Start low dose lopressor  12.5 mg bid.

## 2023-10-30 NOTE — ED Notes (Signed)
 Advanced NG tube to 69 after Dr Inga Manges looked at x-ray.
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
## 2023-10-30 NOTE — Assessment & Plan Note (Addendum)
 On admission. Minimally elevated troponin leak, flat  Cardiac catheterization noted normal coronaries in 2019  Denies any chest pain  Echo pending, on tele   10-31-2023 cardiology consulted.  11-01-2023 echo shows slightly improved LVEF now of 25-30%. Stop IV lopressor . Start low dose lopressor  12.5 mg bid.

## 2023-10-30 NOTE — ED Triage Notes (Addendum)
 Patient has left and right upper quadrant abdominal pain since last night. Vomited x3. No blood in vomit. No diarrhea. Bent over in wheelchair due to pain. Unable to sit still.

## 2023-10-30 NOTE — Assessment & Plan Note (Addendum)
 10-31-2023 hold crestor  while NPO. Not essential at this point.

## 2023-10-30 NOTE — Assessment & Plan Note (Addendum)
 On admission. Baseline creatinine around 1.3-1.4. -stable, trend   10-31-2023 stable  11-01-2023 stable.

## 2023-10-30 NOTE — Assessment & Plan Note (Addendum)
 On admission. -rate controlled on telel  -CHA2DS2-VASc score 4  -hold xarelto , starting heparin    10-31-2023 continue IV heparin  for now.  11-01-2023 stop IV heparin  gtts as SBO has resolved and pt will not need surgery. Restart Xarelto .

## 2023-10-30 NOTE — Assessment & Plan Note (Addendum)
 On admission. 63 year old presenting to ED with 1 day history of sudden abdominal pain with associated nausea/vomiting found to have SBO with abrupt transition point with small bowel feces sign in the mid small bowel by CT.  -Admit to tele/NPO  -general surgery consulted -NG tube placed -very Gentle IV fluid hydration with EF around 20%, reduce to 50cc/hour and will give small bolus if lactic elevated  -no rebound or guarding or need for emergent surgery at this time  -Pain medication -encourage ambulation -Monitor electrolytes -he has no hx of abdominal surgeries/colonoscopy.  -cardiology consult for perioperative risk, echo has been ordered   10-31-2023 management per general surgery. KUB from this AM does not show any contrast in large bowel yet. Continue with NG.  11-01-2023 resolved based on KUB this AM. On clear liquid diet.
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
## 2023-10-30 NOTE — Assessment & Plan Note (Deleted)
 Permanent, rate controlled.

## 2023-10-30 NOTE — Patient Instructions (Signed)
 1. Abdominal pain, generalized (Primary)   2. Nausea and vomiting, unspecified vomiting type    It is important that you exercise regularly at least 30 minutes 5 times a week as tolerated  Think about what you will eat, plan ahead. Choose " clean, green, fresh or frozen" over canned, processed or packaged foods which are more sugary, salty and fatty. 70 to 75% of food eaten should be vegetables and fruit. Three meals at set times with snacks allowed between meals, but they must be fruit or vegetables. Aim to eat over a 12 hour period , example 7 am to 7 pm, and STOP after  your last meal of the day. Drink water,generally about 64 ounces per day, no other drink is as healthy. Fruit juice is best enjoyed in a healthy way, by EATING the fruit.  Thanks for choosing [facility] we consider it a privelige to serve you.

## 2023-10-30 NOTE — Assessment & Plan Note (Addendum)
 10-31-2023 PRN lopressor  for SBP >160 while NPO   11-01-2023 start lopressor  12.5 mg bid. Continue to hold demadex  for now.
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
## 2023-10-30 NOTE — Assessment & Plan Note (Signed)
 Patient taken to the emergency room by the CMA for further evaluation

## 2023-10-30 NOTE — Assessment & Plan Note (Addendum)
 10-31-2023 Resume crestor /xarelto  upon PO intake   11-01-2023 resume xarelto . He can restart crestor  at discharge.

## 2023-10-31 ENCOUNTER — Inpatient Hospital Stay (HOSPITAL_COMMUNITY)

## 2023-10-31 DIAGNOSIS — I5021 Acute systolic (congestive) heart failure: Secondary | ICD-10-CM

## 2023-10-31 DIAGNOSIS — N1831 Chronic kidney disease, stage 3a: Secondary | ICD-10-CM | POA: Diagnosis not present

## 2023-10-31 DIAGNOSIS — I5042 Chronic combined systolic (congestive) and diastolic (congestive) heart failure: Secondary | ICD-10-CM

## 2023-10-31 DIAGNOSIS — I4821 Permanent atrial fibrillation: Secondary | ICD-10-CM

## 2023-10-31 DIAGNOSIS — R7989 Other specified abnormal findings of blood chemistry: Secondary | ICD-10-CM | POA: Diagnosis not present

## 2023-10-31 DIAGNOSIS — Z01810 Encounter for preprocedural cardiovascular examination: Secondary | ICD-10-CM

## 2023-10-31 DIAGNOSIS — I10 Essential (primary) hypertension: Secondary | ICD-10-CM

## 2023-10-31 DIAGNOSIS — Z8673 Personal history of transient ischemic attack (TIA), and cerebral infarction without residual deficits: Secondary | ICD-10-CM

## 2023-10-31 DIAGNOSIS — K56609 Unspecified intestinal obstruction, unspecified as to partial versus complete obstruction: Secondary | ICD-10-CM | POA: Diagnosis not present

## 2023-10-31 LAB — APTT
aPTT: 46 s — ABNORMAL HIGH (ref 24–36)
aPTT: 48 s — ABNORMAL HIGH (ref 24–36)
aPTT: 52 s — ABNORMAL HIGH (ref 24–36)
aPTT: 72 s — ABNORMAL HIGH (ref 24–36)

## 2023-10-31 LAB — CBC
HCT: 47.9 % (ref 39.0–52.0)
Hemoglobin: 14.9 g/dL (ref 13.0–17.0)
MCH: 25.4 pg — ABNORMAL LOW (ref 26.0–34.0)
MCHC: 31.1 g/dL (ref 30.0–36.0)
MCV: 81.6 fL (ref 80.0–100.0)
Platelets: 193 10*3/uL (ref 150–400)
RBC: 5.87 MIL/uL — ABNORMAL HIGH (ref 4.22–5.81)
RDW: 16.3 % — ABNORMAL HIGH (ref 11.5–15.5)
WBC: 10.7 10*3/uL — ABNORMAL HIGH (ref 4.0–10.5)
nRBC: 0 % (ref 0.0–0.2)

## 2023-10-31 LAB — BASIC METABOLIC PANEL WITH GFR
Anion gap: 9 (ref 5–15)
BUN: 19 mg/dL (ref 8–23)
CO2: 26 mmol/L (ref 22–32)
Calcium: 9 mg/dL (ref 8.9–10.3)
Chloride: 99 mmol/L (ref 98–111)
Creatinine, Ser: 0.93 mg/dL (ref 0.61–1.24)
GFR, Estimated: >60 mL/min (ref >60)
Glucose, Bld: 116 mg/dL — ABNORMAL HIGH (ref 70–99)
Potassium: 3.7 mmol/L (ref 3.5–5.1)
Sodium: 134 mmol/L — ABNORMAL LOW (ref 135–145)

## 2023-10-31 LAB — HEPARIN LEVEL (UNFRACTIONATED): Heparin Unfractionated: 0.66 [IU]/mL (ref 0.30–0.70)

## 2023-10-31 LAB — ECHOCARDIOGRAM COMPLETE
AV Mean grad: 10 mmHg
AV Peak grad: 16.8 mmHg
Ao pk vel: 2.05 m/s
Height: 75 in
MV M vel: 5.17 m/s
MV Peak grad: 106.9 mmHg
Weight: 4192 [oz_av]

## 2023-10-31 LAB — MAGNESIUM: Magnesium: 2 mg/dL (ref 1.7–2.4)

## 2023-10-31 MED ORDER — HEPARIN BOLUS VIA INFUSION
1500.0000 [IU] | Freq: Once | INTRAVENOUS | Status: AC
  Administered 2023-10-31: 1500 [IU] via INTRAVENOUS
  Filled 2023-10-31: qty 1500

## 2023-10-31 MED ORDER — HEPARIN BOLUS VIA INFUSION
3000.0000 [IU] | Freq: Once | INTRAVENOUS | Status: AC
  Administered 2023-10-31: 3000 [IU] via INTRAVENOUS
  Filled 2023-10-31: qty 3000

## 2023-10-31 MED ORDER — KCL IN DEXTROSE-NACL 20-5-0.9 MEQ/L-%-% IV SOLN
INTRAVENOUS | Status: DC
  Filled 2023-10-31 (×2): qty 1000

## 2023-10-31 MED ORDER — PERFLUTREN LIPID MICROSPHERE
1.0000 mL | INTRAVENOUS | Status: AC | PRN
  Administered 2023-10-31: 2 mL via INTRAVENOUS

## 2023-10-31 MED ORDER — METOPROLOL TARTRATE 5 MG/5ML IV SOLN
5.0000 mg | Freq: Four times a day (QID) | INTRAVENOUS | Status: DC
  Administered 2023-10-31 (×2): 5 mg via INTRAVENOUS
  Filled 2023-10-31 (×2): qty 5

## 2023-10-31 NOTE — Plan of Care (Signed)

## 2023-10-31 NOTE — Plan of Care (Signed)
  Problem: Education: Goal: Knowledge of General Education information will improve Description: Including pain rating scale, medication(s)/side effects and non-pharmacologic comfort measures 10/31/2023 0645 by Prudy Brownie, RN Outcome: Progressing 10/31/2023 0533 by Prudy Brownie, RN Outcome: Progressing   Problem: Health Behavior/Discharge Planning: Goal: Ability to manage health-related needs will improve 10/31/2023 0645 by Prudy Brownie, RN Outcome: Progressing 10/31/2023 0533 by Prudy Brownie, RN Outcome: Progressing   Problem: Clinical Measurements: Goal: Ability to maintain clinical measurements within normal limits will improve 10/31/2023 0645 by Prudy Brownie, RN Outcome: Progressing 10/31/2023 0533 by Prudy Brownie, RN Outcome: Progressing Goal: Will remain free from infection Outcome: Progressing
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
## 2023-10-31 NOTE — Progress Notes (Signed)
 Subjective/Chief Complaint: Patient states abdominal pain is better.  He is passing gas but no bowel movement.  Has more back pain from the bed he states.   Objective: Vital signs in last 24 hours: Temp:  [97.6 F (36.4 C)-98.9 F (37.2 C)] 97.6 F (36.4 C) (05/24 0357) Pulse Rate:  [43-128] 109 (05/24 0357) Resp:  [13-22] 22 (05/24 0357) BP: (114-165)/(11-132) 155/106 (05/24 0357) SpO2:  [88 %-100 %] 95 % (05/24 0357) Weight:  [118.8 kg-120 kg] 118.8 kg (05/23 1637) Last BM Date : 10/28/23  Intake/Output from previous day: 05/23 0701 - 05/24 0700 In: 2667.6 [I.V.:672.8; NG/GT:90; IV Piggyback:1904.8] Out: 2270 [Urine:1120; Emesis/NG output:1150] Intake/Output this shift: No intake/output data recorded.  Abdomen: Mild distention.  Soft though.  No rebound guarding or significant tenderness to palpation today.  Lab Results:  Recent Labs    10/30/23 1049 10/30/23 1217 10/31/23 0000  WBC 11.2*  --  10.7*  HGB 16.4 17.0 14.9  HCT 52.9* 50.0 47.9  PLT 233  --  193   BMET Recent Labs    10/30/23 1049 10/30/23 1217 10/31/23 0000  NA 133* 135 134*  K 3.6 3.5 3.7  CL 95* 96* 99  CO2 30  --  26  GLUCOSE 121* 127* 116*  BUN 25* 27* 19  CREATININE 1.04 1.10 0.93  CALCIUM  9.4  --  9.0   PT/INR Recent Labs    10/30/23 1645  LABPROT 17.6*  INR 1.4*   ABG No results for input(s): "PHART", "HCO3" in the last 72 hours.  Invalid input(s): "PCO2", "PO2"  Studies/Results: DG Abd Portable 1V-Small Bowel Obstruction Protocol-initial, 8 hr delay Result Date: 10/31/2023 CLINICAL DATA:  Small-bowel obstruction-8 hour delay EXAM: PORTABLE ABDOMEN - 1 VIEW COMPARISON:  Radiograph 10/30/2023 FINDINGS: Partially visualized enteric tube with side port likely near the gastroesophageal junction. Gaseous dilation of multiple loops of small bowel. There is some enteric contrast within the small bowel. No definite contrast within the colon. IMPRESSION: Persistent small bowel  dilation.  No definite contrast in the colon. Enteric tube tip side port near the gastroesophageal junction. Consider advancement 3 cm. Electronically Signed   By: Rozell Cornet M.D.   On: 10/31/2023 02:51   DG Abdomen 1 View Result Date: 10/30/2023 CLINICAL DATA:  NG tube placement EXAM: ABDOMEN - 1 VIEW COMPARISON:  10/30/2023 FINDINGS: Sternotomy and valve prosthesis. Enteric tube tip near GE junction, side-port at the level of distal esophagus. Air distended bowel in the upper abdomen IMPRESSION: Enteric tube tip near GE junction, side-port at the level of distal esophagus. Recommend advancement by at least 10 cm for more optimal positioning. Electronically Signed   By: Esmeralda Hedge M.D.   On: 10/30/2023 17:57   CT ABDOMEN PELVIS W CONTRAST Result Date: 10/30/2023 CLINICAL DATA:  Abdominal pain, acute, nonlocalized, vomiting. EXAM: CT ABDOMEN AND PELVIS WITH CONTRAST TECHNIQUE: Multidetector CT imaging of the abdomen and pelvis was performed using the standard protocol following bolus administration of intravenous contrast. RADIATION DOSE REDUCTION: This exam was performed according to the departmental dose-optimization program which includes automated exposure control, adjustment of the mA and/or kV according to patient size and/or use of iterative reconstruction technique. CONTRAST:  OMNIPAQUE  IOHEXOL  300 MG/ML  SOLN COMPARISON:  Oct 30, 2023, June 30, 2023 FINDINGS: Lower chest: Subsegmental atelectasis in the left lower lobe. Posterior bibasilar dependent atelectasis.Moderate cardiomegaly. Hepatobiliary: No mass.A couple of small radiopaque gallstones in the gallbladder neck. No wall thickening or pericholecystic inflammation. No intrahepatic or extrahepatic biliary  ductal dilation. Pancreas: No mass or main ductal dilation. No peripancreatic inflammation or fluid collection. Spleen: Normal size. No mass. Adrenals/Urinary Tract: No adrenal masses. No renal mass. No nephrolithiasis or  hydronephrosis. The urinary bladder is distended without focal abnormality. Stomach/Bowel: Moderately distended stomach with ingested material present. Abnormal fluid-filled dilation of multiple segments of small bowel in the mid abdomen measuring up to 4 cm. Abrupt transition point with small bowel feces sign in the mid abdomen (axial 46). The downstream small bowel is completely decompressed. No small bowel wall thickening or inflammation. No small bowel obstruction.Normal appendix. Descending colonic diverticulosis. No changes of acute diverticulitis. Vascular/Lymphatic: No aortic aneurysm. Scattered aortoiliac atherosclerosis. No intraabdominal or pelvic lymphadenopathy. Reproductive: Mild prostatomegaly.Small volume free pelvic fluid, likely reactive. Other: No pneumoperitoneum.  Mild, focal mesenteric edema. Musculoskeletal: No acute fracture or destructive lesion. Multilevel degenerative disc disease of the spine. Thoracic DISH. IMPRESSION: 1. Findings consistent with a small-bowel obstruction. Abrupt transition point with small bowel feces sign in the mid small bowel, possibly due to bowel adhesions. Mild mesenteric edema and small volume reactive free fluid in the pelvis. No pneumatosis or portal venous gas. 2. Cholecystolithiasis. No changes of acute cholecystitis. 3. Mild prostatomegaly. 4. Moderate cardiomegaly. Electronically Signed   By: Rance Burrows M.D.   On: 10/30/2023 14:16   DG Abd Acute W/Chest Result Date: 10/30/2023 CLINICAL DATA:  upper abdominal painOctober 3, 2024 EXAM: DG ABDOMEN ACUTE WITH 1 VIEW CHEST COMPARISON:  None Available. FINDINGS: Abnormal gaseous distension of a few segments of small bowel in the upper abdomen measuring up to 4 cm. No significant small bowel gas in the lower abdomen. Minimal gas in the right colon. No pneumoperitoneum. No organomegaly or radiopaque calculi. No acute fracture or destructive lesion. No focal airspace consolidation, pleural effusion, or  pneumothorax.Moderate cardiomegaly. Aortic valve replacement and sternotomy wires. Tortuous aorta. Multilevel degenerative disc disease of the spine. IMPRESSION: Findings worrisome for a small bowel obstruction. A follow-up CT of the abdomen and pelvis has already been ordered at the time of this dictation, please see that report for further characterization. Electronically Signed   By: Rance Burrows M.D.   On: 10/30/2023 13:59    Anti-infectives: Anti-infectives (From admission, onward)    None       Assessment/Plan: Small bowel obstruction-films earlier this morning were nondiagnostic.  Clinically, he is softer and has much less abdominal pain.  NG tube output is unclear looking at the chart.  They have him on heparin  for now which is fine.  Repeat KUB later today and tomorrow morning.  Hopefully he will open up without operative intervention.  He has no signs of peritonitis or progressive pain indicative of worsening bowel obstruction.   LOS: 1 day    Caleb [name] 10/31/2023 High complexity
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
## 2023-10-31 NOTE — Hospital Course (Signed)
 HPI: Caleb [name] is a 63 y.o. male with medical history significant of AAA s/p ascending aortic repair, systolic CHF/NICM, hx of CVA, CKD stage IIIa, HTN, atrial fibrillation on xarelto , hx or AI secondary to bicuspid valve s/p AVR in 2019 who presented to ED with complaints of acute mid abdominal pain. He states las tnight he started to have acute pain that was cramping in nature and 10/10 with associated nausea/vomiting. He had 3-4 episodes of emesis. Pain continued and prompted him to come to ED. He states his last bowel movement was 2 days ago and was hard. No blood in stool. He has had no BM yesterday or today, but is still passing gas. No hx of abdominal surgeries or colonoscopies.    Denies any fever, but had chills yesterday. NO vision changes/headaches, chest pain or palpitations, shortness of breath or cough, diarrhea,  dysuria or leg swelling.    Last took xarelto  sometime last night.    He does not smoke or drink on a regular basis.   Significant Events: Admitted 10/30/2023 for SBO   Admission Labs: WBC 11.2, HgB 16.4, plt 233 Na 133, K 3.6, CO2 of 30, BUN 25, scr 1.04, glu 121 Lipase 42 Lactic acid 1.3 UA green urine, gluc >500, protein 100, negative nitrite, negative LE.  Admission Imaging Studies: AAS Findings worrisome for a small bowel obstruction. A follow-up CT of the abdomen and pelvis has already been ordered at the time of this dictation, please see that report for further characterization. CT abd/pelvis Findings consistent with a small-bowel obstruction. Abrupt transition point with small bowel feces sign in the mid small bowel, possibly due to bowel adhesions. Mild mesenteric edema and small volume reactive free fluid in the pelvis. No pneumatosis or portal venous gas. 2. Cholecystolithiasis. No changes of acute cholecystitis. 3. Mild prostatomegaly. 4. Moderate cardiomegaly Abd xr Enteric tube tip near GE junction, side-port at the level of distal esophagus. Recommend  advancement by at least 10 cm for more optimal positioning.  Significant Labs:   Significant Imaging Studies:   Antibiotic Therapy: Anti-infectives (From admission, onward)    None       Procedures:   Consultants: General surgery cardiology

## 2023-10-31 NOTE — Progress Notes (Signed)
 PROGRESS NOTE    Caleb [name]  ZOX:[id number] DOB: [birth year] DOA: 10/30/2023 PCP: Paseda, Folashade R, FNP  Subjective: Pt seen and examined. Pt states he is passing flatus. Abd feels better. Getting echo this AM.   Hospital Course: HPI: Caleb [name] is a 63 y.o. male with medical history significant of AAA s/p ascending aortic repair, systolic CHF/NICM, hx of CVA, CKD stage IIIa, HTN, atrial fibrillation on xarelto , hx or AI secondary to bicuspid valve s/p AVR in 2019 who presented to ED with complaints of acute mid abdominal pain. He states las tnight he started to have acute pain that was cramping in nature and 10/10 with associated nausea/vomiting. He had 3-4 episodes of emesis. Pain continued and prompted him to come to ED. He states his last bowel movement was 2 days ago and was hard. No blood in stool. He has had no BM yesterday or today, but is still passing gas. No hx of abdominal surgeries or colonoscopies.    Denies any fever, but had chills yesterday. NO vision changes/headaches, chest pain or palpitations, shortness of breath or cough, diarrhea,  dysuria or leg swelling.    Last took xarelto  sometime last night.    He does not smoke or drink on a regular basis.   Significant Events: Admitted 10/30/2023 for SBO   Admission Labs: WBC 11.2, HgB 16.4, plt 233 Na 133, K 3.6, CO2 of 30, BUN 25, scr 1.04, glu 121 Lipase 42 Lactic acid 1.3 UA green urine, gluc >500, protein 100, negative nitrite, negative LE.  Admission Imaging Studies: AAS Findings worrisome for a small bowel obstruction. A follow-up CT of the abdomen and pelvis has already been ordered at the time of this dictation, please see that report for further characterization. CT abd/pelvis Findings consistent with a small-bowel obstruction. Abrupt transition point with small bowel feces sign in the mid small bowel, possibly due to bowel adhesions. Mild mesenteric edema and small volume reactive free fluid in the  pelvis. No pneumatosis or portal venous gas. 2. Cholecystolithiasis. No changes of acute cholecystitis. 3. Mild prostatomegaly. 4. Moderate cardiomegaly Abd xr Enteric tube tip near GE junction, side-port at the level of distal esophagus. Recommend advancement by at least 10 cm for more optimal positioning.  Significant Labs:   Significant Imaging Studies:   Antibiotic Therapy: Anti-infectives (From admission, onward)    None       Procedures:   Consultants: General surgery cardiology    Assessment and Plan: * Small bowel obstruction (HCC) On admission. 63 year old presenting to ED with 1 day history of sudden abdominal pain with associated nausea/vomiting found to have SBO with abrupt transition point with small bowel feces sign in the mid small bowel by CT.  -Admit to tele/NPO  -general surgery consulted -NG tube placed -very Gentle IV fluid hydration with EF around 20%, reduce to 50cc/hour and will give small bolus if lactic elevated  -no rebound or guarding or need for emergent surgery at this time  -Pain medication -encourage ambulation -Monitor electrolytes -he has no hx of abdominal surgeries/colonoscopy.  -cardiology consult for perioperative risk, echo has been ordered   10-31-2023 management per general surgery. KUB from this AM does not show any contrast in large bowel yet. Continue with NG.  Elevated troponin On admission. Minimally elevated troponin leak, flat  Cardiac catheterization noted normal coronaries in 2019  Denies any chest pain  Echo pending, on tele   10-31-2023 cardiology consulted.   Hyperlipidemia 10-31-2023 hold crestor  while  NPO. Not essential at this point.  Chronic kidney disease, stage 3a (HCC) On admission. Baseline creatinine around 1.3-1.4. -stable, trend   10-31-2023 stable  History of CVA (cerebrovascular accident) 10-31-2023 Resume crestor /xarelto  upon PO intake   Essential hypertension 10-31-2023 PRN lopressor  for  SBP >160 while NPO   Permanent atrial fibrillation (HCC) On admission. -rate controlled on telel  -CHA2DS2-VASc score 4  -hold xarelto , starting heparin    10-31-2023 continue IV heparin  for now.  Chronic combined systolic and diastolic heart failure (HCC) On admission. -echo in 2024 showing LVEF less than 20%, severely decreased LV function, indeterminate diastolic parameters, severely dilated LA and RA, moderately reduced RV systolic function. -euvolemic on exam  -resume Toprol -XL 25 mg daily, Jardiance  10 mg daily, torsemide  20 mg daily when back to PO intake  -strict I/O and daily weights   10-31-2023 cardiology consulted. Echo obtained.   DVT prophylaxis:   IV Heparin    Code Status: Full Code Family Communication: no family at bedside. Pt is decisional. Disposition Plan: return home Reason for continuing need for hospitalization: remains with NT tube.  Objective: Vitals:   10/30/23 2236 10/31/23 0041 10/31/23 0357 10/31/23 1219  BP: (!) 124/108 (!) 114/91 (!) 155/106 (!) 156/110  Pulse: (!) 50 85 (!) 109 96  Resp: 16  (!) 22   Temp:  98.9 F (37.2 C) 97.6 F (36.4 C) (!) 97.4 F (36.3 C)  TempSrc:   Oral Oral  SpO2: (!) 88% 96% 95% 97%  Weight:      Height:        Intake/Output Summary (Last 24 hours) at 10/31/2023 1301 Last data filed at 10/31/2023 1100 Gross per 24 hour  Intake 2667.58 ml  Output 2820 ml  Net -152.42 ml   Filed Weights   10/30/23 1038 10/30/23 1637  Weight: 120 kg 118.8 kg    Examination:  Physical Exam Vitals and nursing note reviewed.  Constitutional:      General: He is not in acute distress.    Appearance: He is obese. He is not toxic-appearing or diaphoretic.  HENT:     Head: Normocephalic and atraumatic.     Nose: Nose normal.  Cardiovascular:     Rate and Rhythm: Normal rate and regular rhythm.  Pulmonary:     Effort: Pulmonary effort is normal.     Breath sounds: Normal breath sounds.  Abdominal:     General: Bowel  sounds are normal. There is no distension.     Palpations: Abdomen is soft.     Tenderness: There is no abdominal tenderness.  Musculoskeletal:     Right lower leg: No edema.     Left lower leg: No edema.  Skin:    General: Skin is warm and dry.     Capillary Refill: Capillary refill takes less than 2 seconds.  Neurological:     General: No focal deficit present.     Mental Status: He is alert and oriented to person, place, and time.     Data Reviewed: I have personally reviewed following labs and imaging studies  CBC: Recent Labs  Lab 10/30/23 1049 10/30/23 1217 10/31/23 0000  WBC 11.2*  --  10.7*  HGB 16.4 17.0 14.9  HCT 52.9* 50.0 47.9  MCV 80.6  --  81.6  PLT 233  --  193   Basic Metabolic Panel: Recent Labs  Lab 10/30/23 1049 10/30/23 1217 10/31/23 0000  NA 133* 135 134*  K 3.6 3.5 3.7  CL 95* 96* 99  CO2  30  --  26  GLUCOSE 121* 127* 116*  BUN 25* 27* 19  CREATININE 1.04 1.10 0.93  CALCIUM  9.4  --  9.0  MG  --   --  2.0   GFR: Estimated Creatinine Clearance: 112.9 mL/min (by C-G formula based on SCr of 0.93 mg/dL). Liver Function Tests: Recent Labs  Lab 10/30/23 1049  AST 29  ALT 20  ALKPHOS 50  BILITOT 1.9*  PROT 8.2*  ALBUMIN  3.9   Recent Labs  Lab 10/30/23 1049  LIPASE 42   Coagulation Profile: Recent Labs  Lab 10/30/23 1645  INR 1.4*   BNP (last 3 results) Recent Labs    03/12/23 0053  BNP 794.9*   Sepsis Labs: Recent Labs  Lab 10/30/23 1215 10/30/23 1216 10/30/23 1645  LATICACIDVEN 1.9 2.0* 1.3   Radiology Studies: ECHOCARDIOGRAM COMPLETE Result Date: 10/31/2023    ECHOCARDIOGRAM REPORT   Patient Name:   STEVENSON WINDMILLER Date of Exam: 10/31/2023 Medical Rec #:  [id number]     Height:       75.0 in Accession #:    [id number]    Weight:       262.0 lb Date of Birth:  1961-04-26      BSA:          2.461 m Patient Age:    62 years      BP:           155/106 mmHg Patient Gender: M             HR:           102 bpm. Exam Location:   Inpatient Procedure: 2D Echo, Cardiac Doppler and Color Doppler (Both Spectral and Color            Flow Doppler were utilized during procedure). Indications:    I50.21 Acute systolic (congestive) heart failure  History:        Patient has prior history of Echocardiogram examinations, most                 recent 03/12/2023.                 Aortic Valve: bioprosthetic valve is present in the aortic                 position.  Sonographer:    Andrena Bang Referring Phys: [id number] [employer]  Sonographer Comments: Suboptimal study due to pt moving during exam. IMPRESSIONS  1. Left ventricular ejection fraction, by estimation, is 25 to 30%. The left ventricle has severely decreased function. The left ventricle demonstrates global hypokinesis. The left ventricular internal cavity size was moderately dilated. Left ventricular diastolic parameters are indeterminate.  2. Right ventricular systolic function was not well visualized. The right ventricular size is not well visualized.  3. Left atrial size was severely dilated.  4. The mitral valve is normal in structure. Mild to moderate mitral valve regurgitation. Posterior MR jet, not well visualized. May be underestimating.  5. The aortic valve has been repaired/replaced. Aortic valve regurgitation is not visualized. Not well visualized. There is a bioprosthetic valve present in the aortic position. Vmax 2.1 m/s, MG , DI 0.39 FINDINGS  Left Ventricle: Left ventricular ejection fraction, by estimation, is 25 to 30%. The left ventricle has severely decreased function. The left ventricle demonstrates global hypokinesis. Definity  contrast agent was given IV to delineate the left ventricular endocardial borders. The left ventricular internal cavity size was moderately dilated. There is no left ventricular  hypertrophy. Left ventricular diastolic parameters are indeterminate. Right Ventricle: The right ventricular size is not well visualized. Right vetricular wall thickness was  not well visualized. Right ventricular systolic function was not well visualized. Left Atrium: Left atrial size was severely dilated. Right Atrium: Right atrial size was not well visualized. Pericardium: There is no evidence of pericardial effusion. Mitral Valve: The mitral valve is normal in structure. Mild to moderate mitral valve regurgitation. Tricuspid Valve: The tricuspid valve is normal in structure. Tricuspid valve regurgitation is trivial. Aortic Valve: The aortic valve has been repaired/replaced. Aortic valve regurgitation is not visualized. Aortic valve mean gradient measures 10.0 mmHg. Aortic valve peak gradient measures 16.8 mmHg. There is a bioprosthetic valve present in the aortic position. Pulmonic Valve: The pulmonic valve was not well visualized. Pulmonic valve regurgitation is trivial. Aorta: The aortic root is normal in size and structure. IAS/Shunts: The interatrial septum was not well visualized.  LEFT ATRIUM              Index LA Vol (A2C):   177.0 ml 71.92 ml/m LA Vol (A4C):   144.0 ml 58.51 ml/m LA Biplane Vol: 166.0 ml 67.45 ml/m  AORTIC VALVE AV Vmax:           205.00 cm/s AV Vmean:          145.000 cm/s AV VTI:            0.306 m AV Peak Grad:      16.8 mmHg AV Mean Grad:      10.0 mmHg LVOT Vmax:         75.60 cm/s LVOT Vmean:        40.200 cm/s LVOT VTI:          0.124 m LVOT/AV VTI ratio: 0.41  AORTA Ao Asc diam: 3.80 cm MR Peak grad: 106.9 mmHg MR Mean grad: 58.0 mmHg   SHUNTS MR Vmax:      517.00 cm/s Systemic VTI: 0.12 m MR Vmean:     352.0 cm/s Carson Clara MD Electronically signed by Carson Clara MD Signature Date/Time: 10/31/2023/12:50:50 PM    Final    DG Abd Portable 1V-Small Bowel Obstruction Protocol-initial, 8 hr delay Result Date: 10/31/2023 CLINICAL DATA:  Small-bowel obstruction-8 hour delay EXAM: PORTABLE ABDOMEN - 1 VIEW COMPARISON:  Radiograph 10/30/2023 FINDINGS: Partially visualized enteric tube with side port likely near the gastroesophageal  junction. Gaseous dilation of multiple loops of small bowel. There is some enteric contrast within the small bowel. No definite contrast within the colon. IMPRESSION: Persistent small bowel dilation.  No definite contrast in the colon. Enteric tube tip side port near the gastroesophageal junction. Consider advancement 3 cm. Electronically Signed   By: Rozell Cornet M.D.   On: 10/31/2023 02:51   DG Abdomen 1 View Result Date: 10/30/2023 CLINICAL DATA:  NG tube placement EXAM: ABDOMEN - 1 VIEW COMPARISON:  10/30/2023 FINDINGS: Sternotomy and valve prosthesis. Enteric tube tip near GE junction, side-port at the level of distal esophagus. Air distended bowel in the upper abdomen IMPRESSION: Enteric tube tip near GE junction, side-port at the level of distal esophagus. Recommend advancement by at least 10 cm for more optimal positioning. Electronically Signed   By: Esmeralda Hedge M.D.   On: 10/30/2023 17:57   CT ABDOMEN PELVIS W CONTRAST Result Date: 10/30/2023 CLINICAL DATA:  Abdominal pain, acute, nonlocalized, vomiting. EXAM: CT ABDOMEN AND PELVIS WITH CONTRAST TECHNIQUE: Multidetector CT imaging of the abdomen and pelvis was performed using the standard protocol following  bolus administration of intravenous contrast. RADIATION DOSE REDUCTION: This exam was performed according to the departmental dose-optimization program which includes automated exposure control, adjustment of the mA and/or kV according to patient size and/or use of iterative reconstruction technique. CONTRAST:  OMNIPAQUE  IOHEXOL  300 MG/ML  SOLN COMPARISON:  Oct 30, 2023, June 30, 2023 FINDINGS: Lower chest: Subsegmental atelectasis in the left lower lobe. Posterior bibasilar dependent atelectasis.Moderate cardiomegaly. Hepatobiliary: No mass.A couple of small radiopaque gallstones in the gallbladder neck. No wall thickening or pericholecystic inflammation. No intrahepatic or extrahepatic biliary ductal dilation. Pancreas: No mass or  main ductal dilation. No peripancreatic inflammation or fluid collection. Spleen: Normal size. No mass. Adrenals/Urinary Tract: No adrenal masses. No renal mass. No nephrolithiasis or hydronephrosis. The urinary bladder is distended without focal abnormality. Stomach/Bowel: Moderately distended stomach with ingested material present. Abnormal fluid-filled dilation of multiple segments of small bowel in the mid abdomen measuring up to 4 cm. Abrupt transition point with small bowel feces sign in the mid abdomen (axial 46). The downstream small bowel is completely decompressed. No small bowel wall thickening or inflammation. No small bowel obstruction.Normal appendix. Descending colonic diverticulosis. No changes of acute diverticulitis. Vascular/Lymphatic: No aortic aneurysm. Scattered aortoiliac atherosclerosis. No intraabdominal or pelvic lymphadenopathy. Reproductive: Mild prostatomegaly.Small volume free pelvic fluid, likely reactive. Other: No pneumoperitoneum.  Mild, focal mesenteric edema. Musculoskeletal: No acute fracture or destructive lesion. Multilevel degenerative disc disease of the spine. Thoracic DISH. IMPRESSION: 1. Findings consistent with a small-bowel obstruction. Abrupt transition point with small bowel feces sign in the mid small bowel, possibly due to bowel adhesions. Mild mesenteric edema and small volume reactive free fluid in the pelvis. No pneumatosis or portal venous gas. 2. Cholecystolithiasis. No changes of acute cholecystitis. 3. Mild prostatomegaly. 4. Moderate cardiomegaly. Electronically Signed   By: Rance Burrows M.D.   On: 10/30/2023 14:16   DG Abd Acute W/Chest Result Date: 10/30/2023 CLINICAL DATA:  upper abdominal painOctober 3, 2024 EXAM: DG ABDOMEN ACUTE WITH 1 VIEW CHEST COMPARISON:  None Available. FINDINGS: Abnormal gaseous distension of a few segments of small bowel in the upper abdomen measuring up to 4 cm. No significant small bowel gas in the lower abdomen. Minimal  gas in the right colon. No pneumoperitoneum. No organomegaly or radiopaque calculi. No acute fracture or destructive lesion. No focal airspace consolidation, pleural effusion, or pneumothorax.Moderate cardiomegaly. Aortic valve replacement and sternotomy wires. Tortuous aorta. Multilevel degenerative disc disease of the spine. IMPRESSION: Findings worrisome for a small bowel obstruction. A follow-up CT of the abdomen and pelvis has already been ordered at the time of this dictation, please see that report for further characterization. Electronically Signed   By: Rance Burrows M.D.   On: 10/30/2023 13:59    Scheduled Meds:  bisacodyl   10 mg Rectal Daily   metoprolol  tartrate  5 mg Intravenous Q6H   Continuous Infusions:  heparin  2,100 Units/hr (10/31/23 1043)   lactated ringers        LOS: 1 day   Time spent: 45 minutes  Unk Garb, DO  [facility]  10/31/2023, 1:01 PM

## 2023-10-31 NOTE — Progress Notes (Signed)
*  PRELIMINARY RESULTS* Echocardiogram 2D Echocardiogram has been performed.  Caleb [name] 10/31/2023, 9:43 AM

## 2023-10-31 NOTE — Subjective & Objective (Signed)
 Pt seen and examined. Pt states he is passing flatus. Abd feels better. Getting echo this AM.

## 2023-11-01 ENCOUNTER — Inpatient Hospital Stay (HOSPITAL_COMMUNITY)

## 2023-11-01 DIAGNOSIS — N1831 Chronic kidney disease, stage 3a: Secondary | ICD-10-CM | POA: Diagnosis not present

## 2023-11-01 DIAGNOSIS — K56609 Unspecified intestinal obstruction, unspecified as to partial versus complete obstruction: Secondary | ICD-10-CM | POA: Diagnosis not present

## 2023-11-01 DIAGNOSIS — I5042 Chronic combined systolic (congestive) and diastolic (congestive) heart failure: Secondary | ICD-10-CM | POA: Diagnosis not present

## 2023-11-01 DIAGNOSIS — R7989 Other specified abnormal findings of blood chemistry: Secondary | ICD-10-CM | POA: Diagnosis not present

## 2023-11-01 DIAGNOSIS — E66811 Obesity, class 1: Secondary | ICD-10-CM | POA: Insufficient documentation

## 2023-11-01 LAB — CBC
HCT: 43.9 % (ref 39.0–52.0)
Hemoglobin: 13.2 g/dL (ref 13.0–17.0)
MCH: 24.9 pg — ABNORMAL LOW (ref 26.0–34.0)
MCHC: 30.1 g/dL (ref 30.0–36.0)
MCV: 82.7 fL (ref 80.0–100.0)
Platelets: 171 10*3/uL (ref 150–400)
RBC: 5.31 MIL/uL (ref 4.22–5.81)
RDW: 16.2 % — ABNORMAL HIGH (ref 11.5–15.5)
WBC: 7.5 10*3/uL (ref 4.0–10.5)
nRBC: 0 % (ref 0.0–0.2)

## 2023-11-01 LAB — BASIC METABOLIC PANEL WITH GFR
Anion gap: 10 (ref 5–15)
BUN: 21 mg/dL (ref 8–23)
CO2: 26 mmol/L (ref 22–32)
Calcium: 8.5 mg/dL — ABNORMAL LOW (ref 8.9–10.3)
Chloride: 101 mmol/L (ref 98–111)
Creatinine, Ser: 1.1 mg/dL (ref 0.61–1.24)
GFR, Estimated: >60 mL/min (ref >60)
Glucose, Bld: 97 mg/dL (ref 70–99)
Potassium: 3.8 mmol/L (ref 3.5–5.1)
Sodium: 137 mmol/L (ref 135–145)

## 2023-11-01 LAB — HEPARIN LEVEL (UNFRACTIONATED): Heparin Unfractionated: 0.71 [IU]/mL — ABNORMAL HIGH (ref 0.30–0.70)

## 2023-11-01 MED ORDER — METOPROLOL TARTRATE 25 MG PO TABS
12.5000 mg | ORAL_TABLET | Freq: Two times a day (BID) | ORAL | Status: DC
  Administered 2023-11-01: 12.5 mg via ORAL
  Filled 2023-11-01: qty 1

## 2023-11-01 MED ORDER — RIVAROXABAN 20 MG PO TABS
20.0000 mg | ORAL_TABLET | Freq: Every day | ORAL | Status: DC
  Administered 2023-11-01: 20 mg via ORAL
  Filled 2023-11-01: qty 1

## 2023-11-01 NOTE — Assessment & Plan Note (Signed)
Body mass index is 33.01 kg/m².

## 2023-11-01 NOTE — Progress Notes (Signed)
       Overnight   NAME: DENISE BRAMBLETT MRN: [id number] DOB : 1960/12/05    Date of Service   11/01/2023   HPI/Events of Note    Notified by RN for patient removing multiple NGT insertions and refusing to be NPO. Earlier reports of patient threatening to leave AMA, but decided to stay after speaking with Nursing staff.  Currently reported as intaking large volumes of water in spite of warnings by Nursing staff about NPO orders and the risks of this in his current condition.  Patient is advised that Staff will not be providing fluid or anything PO and that they will follow Surgical orders in this respect.     Interventions/ Plan   Follow Attending and Specialty orders as allowed.       Denece Finger BSN MSNA MSN ACNPC-AG Acute Care Nurse Practitioner [facility]

## 2023-11-01 NOTE — Progress Notes (Signed)
 PROGRESS NOTE    Caleb [name]  YQM:[id number] DOB: Apr 13, 1961 DOA: 10/30/2023 PCP: Paseda, Folashade R, FNP  Subjective: Pt seen and examined.  Pt pulled out multiple NG tube last night. Refused to have another one placed. Was found to be drinking large amounts of water last night. No vomiting.  Has passed flatus and had large BM early this AM.  KUB on my read shows resolved SBO.  Surgery placed him on clear liquid diet. Pt wants to go home. Was threatening to leave AMA last night.   Hospital Course: HPI: Caleb [name] is a 63 y.o. male with medical history significant of AAA s/p ascending aortic repair, systolic CHF/NICM, hx of CVA, CKD stage IIIa, HTN, atrial fibrillation on xarelto , hx or AI secondary to bicuspid valve s/p AVR in 2019 who presented to ED with complaints of acute mid abdominal pain. He states las tnight he started to have acute pain that was cramping in nature and 10/10 with associated nausea/vomiting. He had 3-4 episodes of emesis. Pain continued and prompted him to come to ED. He states his last bowel movement was 2 days ago and was hard. No blood in stool. He has had no BM yesterday or today, but is still passing gas. No hx of abdominal surgeries or colonoscopies.    Denies any fever, but had chills yesterday. NO vision changes/headaches, chest pain or palpitations, shortness of breath or cough, diarrhea,  dysuria or leg swelling.    Last took xarelto  sometime last night.    He does not smoke or drink on a regular basis.   Significant Events: Admitted 10/30/2023 for SBO   Admission Labs: WBC 11.2, HgB 16.4, plt 233 Na 133, K 3.6, CO2 of 30, BUN 25, scr 1.04, glu 121 Lipase 42 Lactic acid 1.3 UA green urine, gluc >500, protein 100, negative nitrite, negative LE.  Admission Imaging Studies: AAS Findings worrisome for a small bowel obstruction. A follow-up CT of the abdomen and pelvis has already been ordered at the time of this dictation, please see that report  for further characterization. CT abd/pelvis Findings consistent with a small-bowel obstruction. Abrupt transition point with small bowel feces sign in the mid small bowel, possibly due to bowel adhesions. Mild mesenteric edema and small volume reactive free fluid in the pelvis. No pneumatosis or portal venous gas. 2. Cholecystolithiasis. No changes of acute cholecystitis. 3. Mild prostatomegaly. 4. Moderate cardiomegaly Abd xr Enteric tube tip near GE junction, side-port at the level of distal esophagus. Recommend advancement by at least 10 cm for more optimal positioning.  Significant Labs:   Significant Imaging Studies:   Antibiotic Therapy: Anti-infectives (From admission, onward)    None       Procedures:   Consultants: General surgery cardiology    Assessment and Plan: * Small bowel obstruction (HCC) On admission. 63 year old presenting to ED with 1 day history of sudden abdominal pain with associated nausea/vomiting found to have SBO with abrupt transition point with small bowel feces sign in the mid small bowel by CT.  -Admit to tele/NPO  -general surgery consulted -NG tube placed -very Gentle IV fluid hydration with EF around 20%, reduce to 50cc/hour and will give small bolus if lactic elevated  -no rebound or guarding or need for emergent surgery at this time  -Pain medication -encourage ambulation -Monitor electrolytes -he has no hx of abdominal surgeries/colonoscopy.  -cardiology consult for perioperative risk, echo has been ordered   10-31-2023 management per general surgery. KUB from  this AM does not show any contrast in large bowel yet. Continue with NG.  11-01-2023 resolved based on KUB this AM. On clear liquid diet.  Elevated troponin On admission. Minimally elevated troponin leak, flat  Cardiac catheterization noted normal coronaries in 2019  Denies any chest pain  Echo pending, on tele   10-31-2023 cardiology consulted.  11-01-2023 echo shows  slightly improved LVEF now of 25-30%. Stop IV lopressor . Start low dose lopressor  12.5 mg bid.   Hyperlipidemia 10-31-2023 hold crestor  while NPO. Not essential at this point.  Chronic kidney disease, stage 3a (HCC) On admission. Baseline creatinine around 1.3-1.4. -stable, trend   10-31-2023 stable  11-01-2023 stable.  History of CVA (cerebrovascular accident) 10-31-2023 Resume crestor /xarelto  upon PO intake   11-01-2023 resume xarelto . He can restart crestor  at discharge.  Essential hypertension 10-31-2023 PRN lopressor  for SBP >160 while NPO   11-01-2023 start lopressor  12.5 mg bid. Continue to hold demadex  for now.  Permanent atrial fibrillation (HCC) On admission. -rate controlled on telel  -CHA2DS2-VASc score 4  -hold xarelto , starting heparin    10-31-2023 continue IV heparin  for now.  11-01-2023 stop IV heparin  gtts as SBO has resolved and pt will not need surgery. Restart Xarelto .  Chronic combined systolic and diastolic heart failure (HCC) On admission. -echo in 2024 showing LVEF less than 20%, severely decreased LV function, indeterminate diastolic parameters, severely dilated LA and RA, moderately reduced RV systolic function. -euvolemic on exam  -resume Toprol -XL 25 mg daily, Jardiance  10 mg daily, torsemide  20 mg daily when back to PO intake  -strict I/O and daily weights   10-31-2023 cardiology consulted. Echo obtained.   11-01-2023 echo shows slightly improved LVEF now of 25-30%. Stop IV lopressor . Start low dose lopressor  12.5 mg bid.   Obesity, Class I, BMI 30-34.9 Body mass index is 33.01 kg/m.   DVT prophylaxis:  rivaroxaban  (XARELTO ) tablet 20 mg     Code Status: Full Code Family Communication: no family at bedside Disposition Plan: return home Reason for continuing need for hospitalization: medically stable. May be home today.  Objective: Vitals:   10/31/23 0357 10/31/23 1219 10/31/23 2026 11/01/23 0449  BP: (!) 155/106 (!) 156/110 (!)  124/103 125/89  Pulse: (!) 109 96 96 97  Resp: (!) 22  20   Temp: 97.6 F (36.4 C) (!) 97.4 F (36.3 C) 98 F (36.7 C) 98.1 F (36.7 C)  TempSrc: Oral Oral  Oral  SpO2: 95% 97% 97% 91%  Weight:    119.8 kg  Height:        Intake/Output Summary (Last 24 hours) at 11/01/2023 0946 Last data filed at 11/01/2023 0900 Gross per 24 hour  Intake 440.95 ml  Output 1950 ml  Net -1509.05 ml   Filed Weights   10/30/23 1038 10/30/23 1637 11/01/23 0449  Weight: 120 kg 118.8 kg 119.8 kg    Examination:  Physical Exam Vitals and nursing note reviewed.  Constitutional:      Appearance: He is obese.  HENT:     Head: Normocephalic and atraumatic.  Cardiovascular:     Rate and Rhythm: Normal rate and regular rhythm.  Pulmonary:     Effort: Pulmonary effort is normal.     Breath sounds: Normal breath sounds.  Abdominal:     General: Bowel sounds are normal. There is no distension.     Palpations: Abdomen is soft.     Tenderness: There is no abdominal tenderness.  Musculoskeletal:     Right lower leg: No edema.  Left lower leg: No edema.  Skin:    General: Skin is warm and dry.     Capillary Refill: Capillary refill takes less than 2 seconds.  Neurological:     General: No focal deficit present.     Mental Status: He is alert and oriented to person, place, and time.     Data Reviewed: I have personally reviewed following labs and imaging studies  CBC: Recent Labs  Lab 10/30/23 1049 10/30/23 1217 10/31/23 0000 11/01/23 0736  WBC 11.2*  --  10.7* 7.5  HGB 16.4 17.0 14.9 13.2  HCT 52.9* 50.0 47.9 43.9  MCV 80.6  --  81.6 82.7  PLT 233  --  193 171   Basic Metabolic Panel: Recent Labs  Lab 10/30/23 1049 10/30/23 1217 10/31/23 0000 11/01/23 0736  NA 133* 135 134* 137  K 3.6 3.5 3.7 3.8  CL 95* 96* 99 101  CO2 30  --  26 26  GLUCOSE 121* 127* 116* 97  BUN 25* 27* 19 21  CREATININE 1.04 1.10 0.93 1.10  CALCIUM  9.4  --  9.0 8.5*  MG  --   --  2.0  --     GFR: Estimated Creatinine Clearance: 95.9 mL/min (by C-G formula based on SCr of 1.1 mg/dL). Liver Function Tests: Recent Labs  Lab 10/30/23 1049  AST 29  ALT 20  ALKPHOS 50  BILITOT 1.9*  PROT 8.2*  ALBUMIN  3.9   Recent Labs  Lab 10/30/23 1049  LIPASE 42    Coagulation Profile: Recent Labs  Lab 10/30/23 1645  INR 1.4*   Radiology Studies: DG Abd 1 View Result Date: 11/01/2023 CLINICAL DATA:  SBO EXAM: ABDOMEN - 1 VIEW COMPARISON:  10/31/2023. FINDINGS: The bowel gas pattern is normal with resolution of dilated small bowel. Oral contrast opacifies colon and rectosigmoid. No radio-opaque calculi or other significant radiographic abnormality are seen. IMPRESSION: Resolved small bowel obstruction. Electronically Signed   By: Sydell Eva M.D.   On: 11/01/2023 09:43   DG Abd Portable 1V Result Date: 10/31/2023 CLINICAL DATA:  Enteric catheter placement EXAM: PORTABLE ABDOMEN - 1 VIEW COMPARISON:  10/31/2023 FINDINGS: Supine frontal view of the lower chest and upper abdomen was obtained, excluding the right hemiabdomen by collimation. Enteric catheter passes below diaphragm, tip and side port projecting over the gastric fundus. Continued gaseous distention of the small bowel consistent with obstruction. IMPRESSION: 1. Enteric catheter tip projecting over the gastric fundus. Electronically Signed   By: Bobbye Burrow M.D.   On: 10/31/2023 20:59   DG Abd 1 View Result Date: 10/31/2023 EXAM: 1 VIEW XRAY OF THE ABDOMEN SUPINE 10/31/2023 09:38:00 AM COMPARISON: 1 view abdomen 10/31/2023 at 02:23 am CT of the abdomen and pelvis with contrast 10/30/2023. CLINICAL HISTORY: 161096 SBO (small bowel obstruction) (HCC) FINDINGS: BOWEL: Mild gaseous distention of the small bowel is similar to the prior studies. No bowel obstruction. PERITONEUM AND SOFT TISSUES: No abnormal calcifications. BONES: No acute osseous abnormality. IMPRESSION: 1. Mild gaseous distention of the small bowel, similar to  prior studies. No bowel obstruction. 2. Gastric tube in stable position. Electronically signed by: Audree Leas MD 10/31/2023 01:32 PM EDT RP Workstation: [id number]   ECHOCARDIOGRAM COMPLETE Result Date: 10/31/2023    ECHOCARDIOGRAM REPORT   Patient Name:   LENORD FRALIX Date of Exam: 10/31/2023 Medical Rec #:  [id number]     Height:       75.0 in Accession #:    [id number]    Weight:  262.0 lb Date of Birth:  08/24/1960      BSA:          2.461 m Patient Age:    62 years      BP:           155/106 mmHg Patient Gender: M             HR:           102 bpm. Exam Location:  Inpatient Procedure: 2D Echo, Cardiac Doppler and Color Doppler (Both Spectral and Color            Flow Doppler were utilized during procedure). Indications:    I50.21 Acute systolic (congestive) heart failure  History:        Patient has prior history of Echocardiogram examinations, most                 recent 03/12/2023.                 Aortic Valve: bioprosthetic valve is present in the aortic                 position.  Sonographer:    Andrena Bang Referring Phys: [id number] [facility]  Sonographer Comments: Suboptimal study due to pt moving during exam. IMPRESSIONS  1. Left ventricular ejection fraction, by estimation, is 25 to 30%. The left ventricle has severely decreased function. The left ventricle demonstrates global hypokinesis. The left ventricular internal cavity size was moderately dilated. Left ventricular diastolic parameters are indeterminate.  2. Right ventricular systolic function was not well visualized. The right ventricular size is not well visualized.  3. Left atrial size was severely dilated.  4. The mitral valve is normal in structure. Mild to moderate mitral valve regurgitation. Posterior MR jet, not well visualized. May be underestimating.  5. The aortic valve has been repaired/replaced. Aortic valve regurgitation is not visualized. Not well visualized. There is a bioprosthetic valve present in the aortic  position. Vmax 2.1 m/s, MG , DI 0.39 FINDINGS  Left Ventricle: Left ventricular ejection fraction, by estimation, is 25 to 30%. The left ventricle has severely decreased function. The left ventricle demonstrates global hypokinesis. Definity  contrast agent was given IV to delineate the left ventricular endocardial borders. The left ventricular internal cavity size was moderately dilated. There is no left ventricular hypertrophy. Left ventricular diastolic parameters are indeterminate. Right Ventricle: The right ventricular size is not well visualized. Right vetricular wall thickness was not well visualized. Right ventricular systolic function was not well visualized. Left Atrium: Left atrial size was severely dilated. Right Atrium: Right atrial size was not well visualized. Pericardium: There is no evidence of pericardial effusion. Mitral Valve: The mitral valve is normal in structure. Mild to moderate mitral valve regurgitation. Tricuspid Valve: The tricuspid valve is normal in structure. Tricuspid valve regurgitation is trivial. Aortic Valve: The aortic valve has been repaired/replaced. Aortic valve regurgitation is not visualized. Aortic valve mean gradient measures 10.0 mmHg. Aortic valve peak gradient measures 16.8 mmHg. There is a bioprosthetic valve present in the aortic position. Pulmonic Valve: The pulmonic valve was not well visualized. Pulmonic valve regurgitation is trivial. Aorta: The aortic root is normal in size and structure. IAS/Shunts: The interatrial septum was not well visualized.  LEFT ATRIUM              Index LA Vol (A2C):   177.0 ml 71.92 ml/m LA Vol (A4C):   144.0 ml 58.51 ml/m LA Biplane Vol: 166.0 ml 67.45 ml/m  AORTIC VALVE AV Vmax:           205.00 cm/s AV Vmean:          145.000 cm/s AV VTI:            0.306 m AV Peak Grad:      16.8 mmHg AV Mean Grad:      10.0 mmHg LVOT Vmax:         75.60 cm/s LVOT Vmean:        40.200 cm/s LVOT VTI:          0.124 m LVOT/AV VTI ratio: 0.41   AORTA Ao Asc diam: 3.80 cm MR Peak grad: 106.9 mmHg MR Mean grad: 58.0 mmHg   SHUNTS MR Vmax:      517.00 cm/s Systemic VTI: 0.12 m MR Vmean:     352.0 cm/s Carson Clara MD Electronically signed by Carson Clara MD Signature Date/Time: 10/31/2023/12:50:50 PM    Final    DG Abd Portable 1V-Small Bowel Obstruction Protocol-initial, 8 hr delay Result Date: 10/31/2023 CLINICAL DATA:  Small-bowel obstruction-8 hour delay EXAM: PORTABLE ABDOMEN - 1 VIEW COMPARISON:  Radiograph 10/30/2023 FINDINGS: Partially visualized enteric tube with side port likely near the gastroesophageal junction. Gaseous dilation of multiple loops of small bowel. There is some enteric contrast within the small bowel. No definite contrast within the colon. IMPRESSION: Persistent small bowel dilation.  No definite contrast in the colon. Enteric tube tip side port near the gastroesophageal junction. Consider advancement 3 cm. Electronically Signed   By: Rozell Cornet M.D.   On: 10/31/2023 02:51   DG Abdomen 1 View Result Date: 10/30/2023 CLINICAL DATA:  NG tube placement EXAM: ABDOMEN - 1 VIEW COMPARISON:  10/30/2023 FINDINGS: Sternotomy and valve prosthesis. Enteric tube tip near GE junction, side-port at the level of distal esophagus. Air distended bowel in the upper abdomen IMPRESSION: Enteric tube tip near GE junction, side-port at the level of distal esophagus. Recommend advancement by at least 10 cm for more optimal positioning. Electronically Signed   By: Esmeralda Hedge M.D.   On: 10/30/2023 17:57   CT ABDOMEN PELVIS W CONTRAST Result Date: 10/30/2023 CLINICAL DATA:  Abdominal pain, acute, nonlocalized, vomiting. EXAM: CT ABDOMEN AND PELVIS WITH CONTRAST TECHNIQUE: Multidetector CT imaging of the abdomen and pelvis was performed using the standard protocol following bolus administration of intravenous contrast. RADIATION DOSE REDUCTION: This exam was performed according to the departmental dose-optimization program  which includes automated exposure control, adjustment of the mA and/or kV according to patient size and/or use of iterative reconstruction technique. CONTRAST:  OMNIPAQUE  IOHEXOL  300 MG/ML  SOLN COMPARISON:  Oct 30, 2023, June 30, 2023 FINDINGS: Lower chest: Subsegmental atelectasis in the left lower lobe. Posterior bibasilar dependent atelectasis.Moderate cardiomegaly. Hepatobiliary: No mass.A couple of small radiopaque gallstones in the gallbladder neck. No wall thickening or pericholecystic inflammation. No intrahepatic or extrahepatic biliary ductal dilation. Pancreas: No mass or main ductal dilation. No peripancreatic inflammation or fluid collection. Spleen: Normal size. No mass. Adrenals/Urinary Tract: No adrenal masses. No renal mass. No nephrolithiasis or hydronephrosis. The urinary bladder is distended without focal abnormality. Stomach/Bowel: Moderately distended stomach with ingested material present. Abnormal fluid-filled dilation of multiple segments of small bowel in the mid abdomen measuring up to 4 cm. Abrupt transition point with small bowel feces sign in the mid abdomen (axial 46). The downstream small bowel is completely decompressed. No small bowel wall thickening or inflammation. No small bowel obstruction.Normal appendix. Descending colonic diverticulosis. No changes of acute  diverticulitis. Vascular/Lymphatic: No aortic aneurysm. Scattered aortoiliac atherosclerosis. No intraabdominal or pelvic lymphadenopathy. Reproductive: Mild prostatomegaly.Small volume free pelvic fluid, likely reactive. Other: No pneumoperitoneum.  Mild, focal mesenteric edema. Musculoskeletal: No acute fracture or destructive lesion. Multilevel degenerative disc disease of the spine. Thoracic DISH. IMPRESSION: 1. Findings consistent with a small-bowel obstruction. Abrupt transition point with small bowel feces sign in the mid small bowel, possibly due to bowel adhesions. Mild mesenteric edema and small volume  reactive free fluid in the pelvis. No pneumatosis or portal venous gas. 2. Cholecystolithiasis. No changes of acute cholecystitis. 3. Mild prostatomegaly. 4. Moderate cardiomegaly. Electronically Signed   By: Rance Burrows M.D.   On: 10/30/2023 14:16   DG Abd Acute W/Chest Result Date: 10/30/2023 CLINICAL DATA:  upper abdominal painOctober 3, 2024 EXAM: DG ABDOMEN ACUTE WITH 1 VIEW CHEST COMPARISON:  None Available. FINDINGS: Abnormal gaseous distension of a few segments of small bowel in the upper abdomen measuring up to 4 cm. No significant small bowel gas in the lower abdomen. Minimal gas in the right colon. No pneumoperitoneum. No organomegaly or radiopaque calculi. No acute fracture or destructive lesion. No focal airspace consolidation, pleural effusion, or pneumothorax.Moderate cardiomegaly. Aortic valve replacement and sternotomy wires. Tortuous aorta. Multilevel degenerative disc disease of the spine. IMPRESSION: Findings worrisome for a small bowel obstruction. A follow-up CT of the abdomen and pelvis has already been ordered at the time of this dictation, please see that report for further characterization. Electronically Signed   By: Rance Burrows M.D.   On: 10/30/2023 13:59    Scheduled Meds:  bisacodyl   10 mg Rectal Daily   metoprolol  tartrate  12.5 mg Oral BID   rivaroxaban   20 mg Oral Q supper   Continuous Infusions:   LOS: 2 days   Time spent: 45 minutes  Unk Garb, DO  [facility]  11/01/2023, 9:46 AM

## 2023-11-01 NOTE — Progress Notes (Signed)
 Rounding Note    Patient Name: LAKE CINQUEMANI Date of Encounter: 11/01/2023  [facility] Cardiologist: Luana Rumple, MD   Subjective   BP 125/89.  Labs not drawn yet this morning.  He removed his NG tube overnight and was threatening to leave AMA.  He denies any chest pain or dyspnea  Inpatient Medications    Scheduled Meds:  bisacodyl   10 mg Rectal Daily   metoprolol  tartrate  5 mg Intravenous Q6H   Continuous Infusions:  dextrose  5 % and 0.9 % NaCl with KCl 20 mEq/L Stopped (10/31/23 1900)   heparin  2,100 Units/hr (10/31/23 2246)   lactated ringers      PRN Meds: acetaminophen , alum & mag hydroxide-simeth, diphenhydrAMINE, HYDROmorphone  (DILAUDID ) injection, lactated ringers , magic mouthwash, menthol-cetylpyridinium, methocarbamol (ROBAXIN) injection, naphazoline-glycerin, ondansetron  (ZOFRAN ) IV, phenol, prochlorperazine, simethicone, sodium chloride    Vital Signs    Vitals:   10/31/23 0357 10/31/23 1219 10/31/23 2026 11/01/23 0449  BP: (!) 155/106 (!) 156/110 (!) 124/103 125/89  Pulse: (!) 109 96 96 97  Resp: (!) 22  20   Temp: 97.6 F (36.4 C) (!) 97.4 F (36.3 C) 98 F (36.7 C) 98.1 F (36.7 C)  TempSrc: Oral Oral  Oral  SpO2: 95% 97% 97% 91%  Weight:    119.8 kg  Height:        Intake/Output Summary (Last 24 hours) at 11/01/2023 0732 Last data filed at 11/01/2023 0600 Gross per 24 hour  Intake 440.95 ml  Output 1950 ml  Net -1509.05 ml      11/01/2023    4:49 AM 10/30/2023    4:37 PM 10/30/2023   10:38 AM  Last 3 Weights  Weight (lbs) 264 lb 1.8 oz 262 lb 264 lb 8.8 oz  Weight (kg) 119.8 kg 118.842 kg 120 kg      Telemetry    A-fib with rate 90s- Personally Reviewed  ECG    No new ECG- Personally Reviewed  Physical Exam   GEN: No acute distress.   Neck: No JVD Cardiac: Irregular, normal rate, 2 out of 6 systolic murmur Respiratory: Clear to auscultation bilaterally. GI: Soft, nontender MS: No edema; No deformity. Neuro:   Nonfocal  Psych: Normal affect   Labs    High Sensitivity Troponin:   Recent Labs  Lab 10/30/23 1056 10/30/23 1321  TROPONINIHS 24* 27*     Chemistry Recent Labs  Lab 10/30/23 1049 10/30/23 1217 10/31/23 0000  NA 133* 135 134*  K 3.6 3.5 3.7  CL 95* 96* 99  CO2 30  --  26  GLUCOSE 121* 127* 116*  BUN 25* 27* 19  CREATININE 1.04 1.10 0.93  CALCIUM  9.4  --  9.0  MG  --   --  2.0  PROT 8.2*  --   --   ALBUMIN  3.9  --   --   AST 29  --   --   ALT 20  --   --   ALKPHOS 50  --   --   BILITOT 1.9*  --   --   GFRNONAA >60  --  >60  ANIONGAP 8  --  9    Lipids No results for input(s): "CHOL", "TRIG", "HDL", "LABVLDL", "LDLCALC", "CHOLHDL" in the last 168 hours.  Hematology Recent Labs  Lab 10/30/23 1049 10/30/23 1217 10/31/23 0000  WBC 11.2*  --  10.7*  RBC 6.56*  --  5.87*  HGB 16.4 17.0 14.9  HCT 52.9* 50.0 47.9  MCV 80.6  --  81.6  MCH 25.0*  --  25.4*  MCHC 31.0  --  31.1  RDW 17.2*  --  16.3*  PLT 233  --  193   Thyroid No results for input(s): "TSH", "FREET4" in the last 168 hours.  BNPNo results for input(s): "BNP", "PROBNP" in the last 168 hours.  DDimer No results for input(s): "DDIMER" in the last 168 hours.   Radiology    DG Abd Portable 1V Result Date: 10/31/2023 CLINICAL DATA:  Enteric catheter placement EXAM: PORTABLE ABDOMEN - 1 VIEW COMPARISON:  10/31/2023 FINDINGS: Supine frontal view of the lower chest and upper abdomen was obtained, excluding the right hemiabdomen by collimation. Enteric catheter passes below diaphragm, tip and side port projecting over the gastric fundus. Continued gaseous distention of the small bowel consistent with obstruction. IMPRESSION: 1. Enteric catheter tip projecting over the gastric fundus. Electronically Signed   By: Bobbye Burrow M.D.   On: 10/31/2023 20:59   DG Abd 1 View Result Date: 10/31/2023 EXAM: 1 VIEW XRAY OF THE ABDOMEN SUPINE 10/31/2023 09:38:00 AM COMPARISON: 1 view abdomen 10/31/2023 at 02:23 am CT of  the abdomen and pelvis with contrast 10/30/2023. CLINICAL HISTORY: [id number] SBO (small bowel obstruction) (HCC) FINDINGS: BOWEL: Mild gaseous distention of the small bowel is similar to the prior studies. No bowel obstruction. PERITONEUM AND SOFT TISSUES: No abnormal calcifications. BONES: No acute osseous abnormality. IMPRESSION: 1. Mild gaseous distention of the small bowel, similar to prior studies. No bowel obstruction. 2. Gastric tube in stable position. Electronically signed by: Audree Leas MD 10/31/2023 01:32 PM EDT RP Workstation: [id number]   ECHOCARDIOGRAM COMPLETE Result Date: 10/31/2023    ECHOCARDIOGRAM REPORT   Patient Name:   ALP GOLDWATER Date of Exam: 10/31/2023 Medical Rec #:  [id number]     Height:       75.0 in Accession #:    [id number]    Weight:       262.0 lb Date of Birth:  [birth year]      BSA:          2.461 m Patient Age:    62 years      BP:           155/106 mmHg Patient Gender: M             HR:           102 bpm. Exam Location:  Inpatient Procedure: 2D Echo, Cardiac Doppler and Color Doppler (Both Spectral and Color            Flow Doppler were utilized during procedure). Indications:    I50.21 Acute systolic (congestive) heart failure  History:        Patient has prior history of Echocardiogram examinations, most                 recent 03/12/2023.                 Aortic Valve: bioprosthetic valve is present in the aortic                 position.  Sonographer:    Andrena Bang Referring Phys: [id number] [facility]  Sonographer Comments: Suboptimal study due to pt moving during exam. IMPRESSIONS  1. Left ventricular ejection fraction, by estimation, is 25 to 30%. The left ventricle has severely decreased function. The left ventricle demonstrates global hypokinesis. The left ventricular internal cavity size was moderately dilated. Left ventricular diastolic parameters are indeterminate.  2. Right ventricular systolic function was  not well visualized. The right ventricular size is not  well visualized.  3. Left atrial size was severely dilated.  4. The mitral valve is normal in structure. Mild to moderate mitral valve regurgitation. Posterior MR jet, not well visualized. May be underestimating.  5. The aortic valve has been repaired/replaced. Aortic valve regurgitation is not visualized. Not well visualized. There is a bioprosthetic valve present in the aortic position. Vmax 2.1 m/s, MG , DI 0.39 FINDINGS  Left Ventricle: Left ventricular ejection fraction, by estimation, is 25 to 30%. The left ventricle has severely decreased function. The left ventricle demonstrates global hypokinesis. Definity  contrast agent was given IV to delineate the left ventricular endocardial borders. The left ventricular internal cavity size was moderately dilated. There is no left ventricular hypertrophy. Left ventricular diastolic parameters are indeterminate. Right Ventricle: The right ventricular size is not well visualized. Right vetricular wall thickness was not well visualized. Right ventricular systolic function was not well visualized. Left Atrium: Left atrial size was severely dilated. Right Atrium: Right atrial size was not well visualized. Pericardium: There is no evidence of pericardial effusion. Mitral Valve: The mitral valve is normal in structure. Mild to moderate mitral valve regurgitation. Tricuspid Valve: The tricuspid valve is normal in structure. Tricuspid valve regurgitation is trivial. Aortic Valve: The aortic valve has been repaired/replaced. Aortic valve regurgitation is not visualized. Aortic valve mean gradient measures 10.0 mmHg. Aortic valve peak gradient measures 16.8 mmHg. There is a bioprosthetic valve present in the aortic position. Pulmonic Valve: The pulmonic valve was not well visualized. Pulmonic valve regurgitation is trivial. Aorta: The aortic root is normal in size and structure. IAS/Shunts: The interatrial septum was not well visualized.  LEFT ATRIUM              Index LA  Vol (A2C):   177.0 ml 71.92 ml/m LA Vol (A4C):   144.0 ml 58.51 ml/m LA Biplane Vol: 166.0 ml 67.45 ml/m  AORTIC VALVE AV Vmax:           205.00 cm/s AV Vmean:          145.000 cm/s AV VTI:            0.306 m AV Peak Grad:      16.8 mmHg AV Mean Grad:      10.0 mmHg LVOT Vmax:         75.60 cm/s LVOT Vmean:        40.200 cm/s LVOT VTI:          0.124 m LVOT/AV VTI ratio: 0.41  AORTA Ao Asc diam: 3.80 cm MR Peak grad: 106.9 mmHg MR Mean grad: 58.0 mmHg   SHUNTS MR Vmax:      517.00 cm/s Systemic VTI: 0.12 m MR Vmean:     352.0 cm/s Carson Clara MD Electronically signed by Carson Clara MD Signature Date/Time: 10/31/2023/12:50:50 PM    Final    DG Abd Portable 1V-Small Bowel Obstruction Protocol-initial, 8 hr delay Result Date: 10/31/2023 CLINICAL DATA:  Small-bowel obstruction-8 hour delay EXAM: PORTABLE ABDOMEN - 1 VIEW COMPARISON:  Radiograph 10/30/2023 FINDINGS: Partially visualized enteric tube with side port likely near the gastroesophageal junction. Gaseous dilation of multiple loops of small bowel. There is some enteric contrast within the small bowel. No definite contrast within the colon. IMPRESSION: Persistent small bowel dilation.  No definite contrast in the colon. Enteric tube tip side port near the gastroesophageal junction. Consider advancement 3 cm. Electronically Signed   By: Christell Cove.D.  On: 10/31/2023 02:51   DG Abdomen 1 View Result Date: 10/30/2023 CLINICAL DATA:  NG tube placement EXAM: ABDOMEN - 1 VIEW COMPARISON:  10/30/2023 FINDINGS: Sternotomy and valve prosthesis. Enteric tube tip near GE junction, side-port at the level of distal esophagus. Air distended bowel in the upper abdomen IMPRESSION: Enteric tube tip near GE junction, side-port at the level of distal esophagus. Recommend advancement by at least 10 cm for more optimal positioning. Electronically Signed   By: Esmeralda Hedge M.D.   On: 10/30/2023 17:57   CT ABDOMEN PELVIS W CONTRAST Result Date:  10/30/2023 CLINICAL DATA:  Abdominal pain, acute, nonlocalized, vomiting. EXAM: CT ABDOMEN AND PELVIS WITH CONTRAST TECHNIQUE: Multidetector CT imaging of the abdomen and pelvis was performed using the standard protocol following bolus administration of intravenous contrast. RADIATION DOSE REDUCTION: This exam was performed according to the departmental dose-optimization program which includes automated exposure control, adjustment of the mA and/or kV according to patient size and/or use of iterative reconstruction technique. CONTRAST:  OMNIPAQUE  IOHEXOL  300 MG/ML  SOLN COMPARISON:  Oct 30, 2023, June 30, 2023 FINDINGS: Lower chest: Subsegmental atelectasis in the left lower lobe. Posterior bibasilar dependent atelectasis.Moderate cardiomegaly. Hepatobiliary: No mass.A couple of small radiopaque gallstones in the gallbladder neck. No wall thickening or pericholecystic inflammation. No intrahepatic or extrahepatic biliary ductal dilation. Pancreas: No mass or main ductal dilation. No peripancreatic inflammation or fluid collection. Spleen: Normal size. No mass. Adrenals/Urinary Tract: No adrenal masses. No renal mass. No nephrolithiasis or hydronephrosis. The urinary bladder is distended without focal abnormality. Stomach/Bowel: Moderately distended stomach with ingested material present. Abnormal fluid-filled dilation of multiple segments of small bowel in the mid abdomen measuring up to 4 cm. Abrupt transition point with small bowel feces sign in the mid abdomen (axial 46). The downstream small bowel is completely decompressed. No small bowel wall thickening or inflammation. No small bowel obstruction.Normal appendix. Descending colonic diverticulosis. No changes of acute diverticulitis. Vascular/Lymphatic: No aortic aneurysm. Scattered aortoiliac atherosclerosis. No intraabdominal or pelvic lymphadenopathy. Reproductive: Mild prostatomegaly.Small volume free pelvic fluid, likely reactive. Other: No  pneumoperitoneum.  Mild, focal mesenteric edema. Musculoskeletal: No acute fracture or destructive lesion. Multilevel degenerative disc disease of the spine. Thoracic DISH. IMPRESSION: 1. Findings consistent with a small-bowel obstruction. Abrupt transition point with small bowel feces sign in the mid small bowel, possibly due to bowel adhesions. Mild mesenteric edema and small volume reactive free fluid in the pelvis. No pneumatosis or portal venous gas. 2. Cholecystolithiasis. No changes of acute cholecystitis. 3. Mild prostatomegaly. 4. Moderate cardiomegaly. Electronically Signed   By: Rance Burrows M.D.   On: 10/30/2023 14:16   DG Abd Acute W/Chest Result Date: 10/30/2023 CLINICAL DATA:  upper abdominal painOctober 3, 2024 EXAM: DG ABDOMEN ACUTE WITH 1 VIEW CHEST COMPARISON:  None Available. FINDINGS: Abnormal gaseous distension of a few segments of small bowel in the upper abdomen measuring up to 4 cm. No significant small bowel gas in the lower abdomen. Minimal gas in the right colon. No pneumoperitoneum. No organomegaly or radiopaque calculi. No acute fracture or destructive lesion. No focal airspace consolidation, pleural effusion, or pneumothorax.Moderate cardiomegaly. Aortic valve replacement and sternotomy wires. Tortuous aorta. Multilevel degenerative disc disease of the spine. IMPRESSION: Findings worrisome for a small bowel obstruction. A follow-up CT of the abdomen and pelvis has already been ordered at the time of this dictation, please see that report for further characterization. Electronically Signed   By: Rance Burrows M.D.   On: 10/30/2023 13:59  Cardiac Studies     Patient Profile     63 y.o. 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  RCRI score 3 (CHF, CVA, intraperitoneal surgery), corresponding to 11% risk of major adverse cardiac event.  Normal coronary arteries on cath in 2019.  Functional capacity greater than 4 METS, reports he works out regularly lifting weights.  Given his comorbidities, he is at elevated but not prohibitive risk for surgery.  Currently appears compensated.  Echocardiogram this admission shows EF 25 to 30%.  He appears to be improving with conservative management of his SBO, though he removed his NG tube and is now refusing.  No further cardiac workup recommended   Chronic combined heart failure: EF less than 20% on echocardiogram 03/2023.  Nonischemic cardiomyopathy, underwent cardiac catheterization in 2019 with normal coronary arteries.  Has not been compliant with GDMT - On Jardiance  10 mg daily, torsemide  20 mg daily at home.  On hold while n.p.o.  Currently appears euvolemic - Closely monitor volume status   Permanent atrial fibrillation: Status post left atrial appendage clipping in 2019.  CHA2DS2-VASc 4 (CHF, hypertension, CVA).  On Xarelto  - Xarelto  on hold while NPO.  Started heparin  drip - Started IV Lopressor  5 mg every 6 hours while NPO.  Heart rate currently appears controlled   Hypertension: BP has been elevated, likely due to pain in setting of SBO.  Has improved with starting IV Lopressor    Hyperlipidemia: On rosuvastatin , on hold while n.p.o.  For questions or updates, please contact  [facility] Please consult [email or web address] for contact info under        Signed, Wendie Hamburg, MD  11/01/2023, 7:32 AM

## 2023-11-01 NOTE — Progress Notes (Signed)
 Subjective/Chief Complaint: Pt pulled out 2 NGT had a BM denies abdominal pain    Objective: Vital signs in last 24 hours: Temp:  [97.4 F (36.3 C)-98.1 F (36.7 C)] 98.1 F (36.7 C) (05/25 0449) Pulse Rate:  [96-97] 97 (05/25 0449) Resp:  [20] 20 (05/24 2026) BP: (124-156)/(89-110) 125/89 (05/25 0449) SpO2:  [91 %-97 %] 91 % (05/25 0449) Weight:  [119.8 kg] 119.8 kg (05/25 0449) Last BM Date : 10/28/23  Intake/Output from previous day: 05/24 0701 - 05/25 0700 In: 441 [I.V.:441] Out: 1950 [Urine:950; Emesis/NG output:1000] Intake/Output this shift: No intake/output data recorded.  Abdomen: soft , NT  min distention   Lab Results:  Recent Labs    10/31/23 0000 11/01/23 0736  WBC 10.7* 7.5  HGB 14.9 13.2  HCT 47.9 43.9  PLT 193 171   BMET Recent Labs    10/30/23 1049 10/30/23 1217 10/31/23 0000  NA 133* 135 134*  K 3.6 3.5 3.7  CL 95* 96* 99  CO2 30  --  26  GLUCOSE 121* 127* 116*  BUN 25* 27* 19  CREATININE 1.04 1.10 0.93  CALCIUM  9.4  --  9.0   PT/INR Recent Labs    10/30/23 1645  LABPROT 17.6*  INR 1.4*   ABG No results for input(s): "PHART", "HCO3" in the last 72 hours.  Invalid input(s): "PCO2", "PO2"  Studies/Results: DG Abd Portable 1V Result Date: 10/31/2023 CLINICAL DATA:  Enteric catheter placement EXAM: PORTABLE ABDOMEN - 1 VIEW COMPARISON:  10/31/2023 FINDINGS: Supine frontal view of the lower chest and upper abdomen was obtained, excluding the right hemiabdomen by collimation. Enteric catheter passes below diaphragm, tip and side port projecting over the gastric fundus. Continued gaseous distention of the small bowel consistent with obstruction. IMPRESSION: 1. Enteric catheter tip projecting over the gastric fundus. Electronically Signed   By: Bobbye Burrow M.D.   On: 10/31/2023 20:59   DG Abd 1 View Result Date: 10/31/2023 EXAM: 1 VIEW XRAY OF THE ABDOMEN SUPINE 10/31/2023 09:38:00 AM COMPARISON: 1 view abdomen 10/31/2023 at 02:23  am CT of the abdomen and pelvis with contrast 10/30/2023. CLINICAL HISTORY: 098119 SBO (small bowel obstruction) (HCC) FINDINGS: BOWEL: Mild gaseous distention of the small bowel is similar to the prior studies. No bowel obstruction. PERITONEUM AND SOFT TISSUES: No abnormal calcifications. BONES: No acute osseous abnormality. IMPRESSION: 1. Mild gaseous distention of the small bowel, similar to prior studies. No bowel obstruction. 2. Gastric tube in stable position. Electronically signed by: Audree Leas MD 10/31/2023 01:32 PM EDT RP Workstation: [id number]   ECHOCARDIOGRAM COMPLETE Result Date: 10/31/2023    ECHOCARDIOGRAM REPORT   Patient Name:   Caleb [name] Date of Exam: 10/31/2023 Medical Rec #:  [id number]     Height:       75.0 in Accession #:    [id number]    Weight:       262.0 lb Date of Birth:  September 23, 1960      BSA:          2.461 m Patient Age:    62 years      BP:           155/106 mmHg Patient Gender: M             HR:           102 bpm. Exam Location:  Inpatient Procedure: 2D Echo, Cardiac Doppler and Color Doppler (Both Spectral and Color  Flow Doppler were utilized during procedure). Indications:    I50.21 Acute systolic (congestive) heart failure  History:        Patient has prior history of Echocardiogram examinations, most                 recent 03/12/2023.                 Aortic Valve: bioprosthetic valve is present in the aortic                 position.  Sonographer:    Andrena Bang Referring Phys: [id number] [facility]  Sonographer Comments: Suboptimal study due to pt moving during exam. IMPRESSIONS  1. Left ventricular ejection fraction, by estimation, is 25 to 30%. The left ventricle has severely decreased function. The left ventricle demonstrates global hypokinesis. The left ventricular internal cavity size was moderately dilated. Left ventricular diastolic parameters are indeterminate.  2. Right ventricular systolic function was not well visualized. The right ventricular  size is not well visualized.  3. Left atrial size was severely dilated.  4. The mitral valve is normal in structure. Mild to moderate mitral valve regurgitation. Posterior MR jet, not well visualized. May be underestimating.  5. The aortic valve has been repaired/replaced. Aortic valve regurgitation is not visualized. Not well visualized. There is a bioprosthetic valve present in the aortic position. Vmax 2.1 m/s, MG , DI 0.39 FINDINGS  Left Ventricle: Left ventricular ejection fraction, by estimation, is 25 to 30%. The left ventricle has severely decreased function. The left ventricle demonstrates global hypokinesis. Definity  contrast agent was given IV to delineate the left ventricular endocardial borders. The left ventricular internal cavity size was moderately dilated. There is no left ventricular hypertrophy. Left ventricular diastolic parameters are indeterminate. Right Ventricle: The right ventricular size is not well visualized. Right vetricular wall thickness was not well visualized. Right ventricular systolic function was not well visualized. Left Atrium: Left atrial size was severely dilated. Right Atrium: Right atrial size was not well visualized. Pericardium: There is no evidence of pericardial effusion. Mitral Valve: The mitral valve is normal in structure. Mild to moderate mitral valve regurgitation. Tricuspid Valve: The tricuspid valve is normal in structure. Tricuspid valve regurgitation is trivial. Aortic Valve: The aortic valve has been repaired/replaced. Aortic valve regurgitation is not visualized. Aortic valve mean gradient measures 10.0 mmHg. Aortic valve peak gradient measures 16.8 mmHg. There is a bioprosthetic valve present in the aortic position. Pulmonic Valve: The pulmonic valve was not well visualized. Pulmonic valve regurgitation is trivial. Aorta: The aortic root is normal in size and structure. IAS/Shunts: The interatrial septum was not well visualized.  LEFT ATRIUM               Index LA Vol (A2C):   177.0 ml 71.92 ml/m LA Vol (A4C):   144.0 ml 58.51 ml/m LA Biplane Vol: 166.0 ml 67.45 ml/m  AORTIC VALVE AV Vmax:           205.00 cm/s AV Vmean:          145.000 cm/s AV VTI:            0.306 m AV Peak Grad:      16.8 mmHg AV Mean Grad:      10.0 mmHg LVOT Vmax:         75.60 cm/s LVOT Vmean:        40.200 cm/s LVOT VTI:          0.124 m LVOT/AV VTI ratio:  0.41  AORTA Ao Asc diam: 3.80 cm MR Peak grad: 106.9 mmHg MR Mean grad: 58.0 mmHg   SHUNTS MR Vmax:      517.00 cm/s Systemic VTI: 0.12 m MR Vmean:     352.0 cm/s Carson Clara MD Electronically signed by Carson Clara MD Signature Date/Time: 10/31/2023/12:50:50 PM    Final    DG Abd Portable 1V-Small Bowel Obstruction Protocol-initial, 8 hr delay Result Date: 10/31/2023 CLINICAL DATA:  Small-bowel obstruction-8 hour delay EXAM: PORTABLE ABDOMEN - 1 VIEW COMPARISON:  Radiograph 10/30/2023 FINDINGS: Partially visualized enteric tube with side port likely near the gastroesophageal junction. Gaseous dilation of multiple loops of small bowel. There is some enteric contrast within the small bowel. No definite contrast within the colon. IMPRESSION: Persistent small bowel dilation.  No definite contrast in the colon. Enteric tube tip side port near the gastroesophageal junction. Consider advancement 3 cm. Electronically Signed   By: Rozell Cornet M.D.   On: 10/31/2023 02:51   DG Abdomen 1 View Result Date: 10/30/2023 CLINICAL DATA:  NG tube placement EXAM: ABDOMEN - 1 VIEW COMPARISON:  10/30/2023 FINDINGS: Sternotomy and valve prosthesis. Enteric tube tip near GE junction, side-port at the level of distal esophagus. Air distended bowel in the upper abdomen IMPRESSION: Enteric tube tip near GE junction, side-port at the level of distal esophagus. Recommend advancement by at least 10 cm for more optimal positioning. Electronically Signed   By: Esmeralda Hedge M.D.   On: 10/30/2023 17:57   CT ABDOMEN PELVIS W CONTRAST Result  Date: 10/30/2023 CLINICAL DATA:  Abdominal pain, acute, nonlocalized, vomiting. EXAM: CT ABDOMEN AND PELVIS WITH CONTRAST TECHNIQUE: Multidetector CT imaging of the abdomen and pelvis was performed using the standard protocol following bolus administration of intravenous contrast. RADIATION DOSE REDUCTION: This exam was performed according to the departmental dose-optimization program which includes automated exposure control, adjustment of the mA and/or kV according to patient size and/or use of iterative reconstruction technique. CONTRAST:  OMNIPAQUE  IOHEXOL  300 MG/ML  SOLN COMPARISON:  Oct 30, 2023, June 30, 2023 FINDINGS: Lower chest: Subsegmental atelectasis in the left lower lobe. Posterior bibasilar dependent atelectasis.Moderate cardiomegaly. Hepatobiliary: No mass.A couple of small radiopaque gallstones in the gallbladder neck. No wall thickening or pericholecystic inflammation. No intrahepatic or extrahepatic biliary ductal dilation. Pancreas: No mass or main ductal dilation. No peripancreatic inflammation or fluid collection. Spleen: Normal size. No mass. Adrenals/Urinary Tract: No adrenal masses. No renal mass. No nephrolithiasis or hydronephrosis. The urinary bladder is distended without focal abnormality. Stomach/Bowel: Moderately distended stomach with ingested material present. Abnormal fluid-filled dilation of multiple segments of small bowel in the mid abdomen measuring up to 4 cm. Abrupt transition point with small bowel feces sign in the mid abdomen (axial 46). The downstream small bowel is completely decompressed. No small bowel wall thickening or inflammation. No small bowel obstruction.Normal appendix. Descending colonic diverticulosis. No changes of acute diverticulitis. Vascular/Lymphatic: No aortic aneurysm. Scattered aortoiliac atherosclerosis. No intraabdominal or pelvic lymphadenopathy. Reproductive: Mild prostatomegaly.Small volume free pelvic fluid, likely reactive. Other: No  pneumoperitoneum.  Mild, focal mesenteric edema. Musculoskeletal: No acute fracture or destructive lesion. Multilevel degenerative disc disease of the spine. Thoracic DISH. IMPRESSION: 1. Findings consistent with a small-bowel obstruction. Abrupt transition point with small bowel feces sign in the mid small bowel, possibly due to bowel adhesions. Mild mesenteric edema and small volume reactive free fluid in the pelvis. No pneumatosis or portal venous gas. 2. Cholecystolithiasis. No changes of acute cholecystitis. 3. Mild prostatomegaly. 4. Moderate cardiomegaly.  Electronically Signed   By: Rance Burrows M.D.   On: 10/30/2023 14:16   DG Abd Acute W/Chest Result Date: 10/30/2023 CLINICAL DATA:  upper abdominal painOctober 3, 2024 EXAM: DG ABDOMEN ACUTE WITH 1 VIEW CHEST COMPARISON:  None Available. FINDINGS: Abnormal gaseous distension of a few segments of small bowel in the upper abdomen measuring up to 4 cm. No significant small bowel gas in the lower abdomen. Minimal gas in the right colon. No pneumoperitoneum. No organomegaly or radiopaque calculi. No acute fracture or destructive lesion. No focal airspace consolidation, pleural effusion, or pneumothorax.Moderate cardiomegaly. Aortic valve replacement and sternotomy wires. Tortuous aorta. Multilevel degenerative disc disease of the spine. IMPRESSION: Findings worrisome for a small bowel obstruction. A follow-up CT of the abdomen and pelvis has already been ordered at the time of this dictation, please see that report for further characterization. Electronically Signed   By: Rance Burrows M.D.   On: 10/30/2023 13:59    Anti-infectives: Anti-infectives (From admission, onward)    None       Assessment/Plan: Small bowel obstruction - doing better pulled out NGT and had bowel function- leave NGT out and start diet   No acute surgical need at this point      LOS: 2 days    Rodrigo Clara MD  11/01/2023 MODERATE COMPLEXITY

## 2023-11-01 NOTE — Progress Notes (Signed)
   RN reports that pt is leaving AMA.  Unk Garb, DO [facility]

## 2023-11-01 NOTE — Progress Notes (Signed)
 Patient insists leaving AMA.  Alert, oriented x4, independent mobility.  Concerns including Afib/RVR and SBO diagnoses and treatment required for patient safety and well being expressed and discussed.  Patient verbalized understanding, but opted to leave.  He agreed to sign AMA form.  MD Unk Garb advised.

## 2023-11-01 NOTE — Progress Notes (Signed)
 Lab tech rounding to draw labs, pt pulled out NGT. NGT lying aside pt in bed , pt restless and refused to have NGT replaced, explained to pt outcome in not having NGT. NP updated and received.

## 2023-11-05 ENCOUNTER — Ambulatory Visit: Payer: Self-pay | Admitting: Nurse Practitioner

## 2023-11-13 ENCOUNTER — Ambulatory Visit (INDEPENDENT_AMBULATORY_CARE_PROVIDER_SITE_OTHER): Payer: Self-pay | Admitting: Nurse Practitioner

## 2023-11-13 ENCOUNTER — Other Ambulatory Visit: Payer: Self-pay

## 2023-11-13 ENCOUNTER — Encounter: Payer: Self-pay | Admitting: Nurse Practitioner

## 2023-11-13 VITALS — BP 113/89 | HR 109 | Temp 98.1°F | Resp 16 | Ht 75.0 in | Wt 263.0 lb

## 2023-11-13 DIAGNOSIS — K56609 Unspecified intestinal obstruction, unspecified as to partial versus complete obstruction: Secondary | ICD-10-CM | POA: Diagnosis not present

## 2023-11-13 MED ORDER — SILDENAFIL CITRATE 100 MG PO TABS
50.0000 mg | ORAL_TABLET | Freq: Every day | ORAL | 11 refills | Status: DC | PRN
  Filled 2023-11-13: qty 5, 5d supply, fill #0

## 2023-11-13 MED ORDER — SILDENAFIL CITRATE 100 MG PO TABS: 50.0000 mg | ORAL_TABLET | Freq: Every day | ORAL | 11 refills | Status: AC | PRN
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
## 2023-11-13 NOTE — Patient Instructions (Signed)
 1. Small bowel obstruction (HCC) (Primary)  Constipation, Adult Constipation is when a person has fewer than three bowel movements in a week, has difficulty having a bowel movement, or has stools (feces) that are dry, hard, or larger than normal. Constipation may be caused by an underlying condition. It may become worse with age if a person takes certain medicines and does not take in enough fluids. Follow these instructions at home: Eating and drinking  Eat foods that have a lot of fiber, such as beans, whole grains, and fresh fruits and vegetables. Limit foods that are low in fiber and high in fat and processed sugars, such as fried or sweet foods. These include french fries, hamburgers, cookies, candies, and soda. Drink enough fluid to keep your urine pale yellow. General instructions Exercise regularly or as told by your health care provider. Try to do 150 minutes of moderate exercise each week. Use the bathroom when you have the urge to go. Do not hold it in. Take over-the-counter and prescription medicines only as told by your health care provider. This includes any fiber supplements. During bowel movements: Practice deep breathing while relaxing the lower abdomen. Practice pelvic floor relaxation. Watch your condition for any changes. Let your health care provider know about them. Keep all follow-up visits as told by your health care provider. This is important. Contact a health care provider if: You have pain that gets worse. You have a fever. You do not have a bowel movement after 4 days. You vomit. You are not hungry or you lose weight. You are bleeding from the opening between the buttocks (anus). You have thin, pencil-like stools. Get help right away if: You have a fever and your symptoms suddenly get worse. You leak stool or have blood in your stool. Your abdomen is bloated. You have severe pain in your abdomen. You feel dizzy or you faint. Summary Constipation is when  a person has fewer than three bowel movements in a week, has difficulty having a bowel movement, or has stools (feces) that are dry, hard, or larger than normal. Eat foods that have a lot of fiber, such as beans, whole grains, and fresh fruits and vegetables. Drink enough fluid to keep your urine pale yellow. Take over-the-counter and prescription medicines only as told by your health care provider. This includes any fiber supplements. This information is not intended to replace advice given to you by your health care provider. Make sure you discuss any questions you have with your health care provider. Document Revised: 04/09/2022 Document Reviewed: 04/09/2022 Elsevier Patient Education  2024 [employer].

## 2023-11-20 ENCOUNTER — Other Ambulatory Visit: Payer: Self-pay

## 2023-11-30 ENCOUNTER — Ambulatory Visit: Payer: Self-pay | Admitting: General Practice

## 2023-11-30 ENCOUNTER — Ambulatory Visit (HOSPITAL_COMMUNITY)
Admission: RE | Admit: 2023-11-30 | Discharge: 2023-11-30 | Disposition: A | Discharge status: Home / Self Care | Source: Ambulatory Visit | Attending: Cardiology | Admitting: Cardiology

## 2023-11-30 DIAGNOSIS — I5020 Unspecified systolic (congestive) heart failure: Secondary | ICD-10-CM

## 2023-11-30 LAB — ECHOCARDIOGRAM COMPLETE
AV Mean grad: 22 mmHg
AV Peak grad: 40.4 mmHg
Ao pk vel: 3.18 m/s
S' Lateral: 5.65 cm

## 2023-11-30 MED ORDER — PERFLUTREN LIPID MICROSPHERE: 1.0000 mL | INTRAVENOUS | Status: AC | PRN

## 2024-01-01 ENCOUNTER — Ambulatory Visit: Payer: Self-pay | Admitting: Nurse Practitioner

## 2024-01-07 ENCOUNTER — Ambulatory Visit: Payer: PRIVATE HEALTH INSURANCE | Admitting: Cardiology

## 2024-02-09 ENCOUNTER — Ambulatory Visit: Payer: PRIVATE HEALTH INSURANCE | Attending: Cardiology | Admitting: Cardiology

## 2024-02-09 NOTE — Progress Notes (Deleted)
  Electrophysiology Office Note:   Date:  02/09/2024  ID:  Caleb [name], DOB Oct 26, 1960, MRN [id number]  Primary Cardiologist: Jerel Balding, MD Primary Heart Failure: None Electrophysiologist: None  {Click to update primary MD,subspecialty MD or APP then REFRESH:1}    History of Present Illness:   Caleb [name] is a 63 y.o. male with h/o chronic systolic heart failure, permanent atrial fibrillation, bicuspid aortic valve with thoracic aortic aneurysm post Bentall in 2019 with left atrial appendage clipping, CVA, CKD, hypertension seen today for  for Electrophysiology evaluation of chronic systolic heart failure at the request of Josefa Beauvais.    He was initially seen by cardiology, 2019 and 2020.  He moved to [place]  for several months was seen by cardiologist there.  He underwent Bentall with late atrial appendage clipping at that time.  He was admitted to the hospital October 2024 with concerns for CVA as well as rapid atrial fibrillation.  MRI showed no evidence of stroke.  Urine tox screen was positive for amphetamines and THC.  Review of systems complete and found to be negative unless listed in HPI.   EP Information / Studies Reviewed:    {EKGtoday:28818}        Risk Assessment/Calculations:    CHA2DS2-VASc Score = 4  {Confirm score is correct.  If not, click here to update score.  REFRESH note.  :1} This indicates a 4.8% annual risk of stroke. The patient's score is based upon: CHF History: 1 HTN History: 1 Diabetes History: 0 Stroke History: 2 Vascular Disease History: 0 Age Score: 0 Gender Score: 0   {This patient has a significant risk of stroke if diagnosed with atrial fibrillation.  Please consider VKA or DOAC agent for anticoagulation if the bleeding risk is acceptable.   You can also use the SmartPhrase .[email or web address] for documentation.   :[id number]}         Physical Exam:   VS:  There were no vitals taken for this visit.   Wt Readings from Last 3 Encounters:   11/13/23 263 lb (119.3 kg)  11/01/23 264 lb 1.8 oz (119.8 kg)  10/21/23 263 lb 6.4 oz (119.5 kg)     GEN: Well nourished, well developed in no acute distress NECK: No JVD; No carotid bruits CARDIAC: {EPRHYTHM:28826}, no murmurs, rubs, gallops RESPIRATORY:  Clear to auscultation without rales, wheezing or rhonchi  ABDOMEN: Soft, non-tender, non-distended EXTREMITIES:  No edema; No deformity   ASSESSMENT AND PLAN:    1.  Chronic systolic heart failure: Due to nonischemic cardiomyopathy.  On medical therapy per primary cardiology.  Ejection fraction remains reduced despite medical therapy.  He would benefit from ICD.  Risk and benefits have been discussed.  He understands the risks and is agreed to the procedure.  Explained risks, benefits, and alternatives to ICD implantation, including but not limited to bleeding, infection, pneumothorax, pericardial effusion, lead dislodgement, heart attack, stroke, or death.  Pt verbalized understanding and agrees to proceed.  2.  Permanent atrial fibrillation: Rate well-controlled.  Continue metoprolol   3.  Secondary hypercoagulable state: On Xarelto   4.  Hypertension:***  Follow up with {EPMDS:28135::EP Team} {EPFOLLOW LE:71826}  Signed, Fredna Stricker Gladis Norton, MD

## 2024-02-15 ENCOUNTER — Ambulatory Visit: Payer: Self-pay | Admitting: Nurse Practitioner

## 2024-02-19 ENCOUNTER — Other Ambulatory Visit: Payer: Self-pay

## 2024-03-17 ENCOUNTER — Ambulatory Visit: Payer: PRIVATE HEALTH INSURANCE | Attending: Cardiology | Admitting: Cardiology

## 2024-03-17 ENCOUNTER — Encounter: Payer: Self-pay | Admitting: Cardiology

## 2024-03-17 VITALS — BP 108/64 | HR 106 | Ht 75.0 in | Wt 251.0 lb

## 2024-03-17 DIAGNOSIS — I4821 Permanent atrial fibrillation: Secondary | ICD-10-CM | POA: Diagnosis present

## 2024-03-17 DIAGNOSIS — I428 Other cardiomyopathies: Secondary | ICD-10-CM | POA: Diagnosis present

## 2024-03-17 DIAGNOSIS — I5042 Chronic combined systolic (congestive) and diastolic (congestive) heart failure: Secondary | ICD-10-CM | POA: Insufficient documentation

## 2024-03-17 DIAGNOSIS — D6869 Other thrombophilia: Secondary | ICD-10-CM | POA: Diagnosis present

## 2024-03-17 NOTE — Patient Instructions (Signed)
 Medication Instructions:  Your physician recommends that you continue on your current medications as directed. Please refer to the Current Medication list given to you today.  *If you need a refill on your cardiac medications before your next appointment, please call your pharmacy*  Lab Work: None ordered  If you have any lab test that is abnormal or we need to change your treatment, we will call you to review the results.  Testing/Procedures: Please call the office if you would like a defibrillator  Follow-Up: At [facility], you and your health needs are our priority.  As part of our continuing mission to provide you with exceptional heart care, our providers are all part of one team.  This team includes your primary Cardiologist (physician) and Advanced Practice Providers or APPs (Physician Assistants and Nurse Practitioners) who all work together to provide you with the care you need, when you need it.  Your next appointment:   To be determined  Provider:   Soyla Norton, MD    We recommend signing up for the patient portal called MyChart.  Sign up information is provided on this After Visit Summary.  MyChart is used to connect with patients for Virtual Visits (Telemedicine).  Patients are able to view lab/test results, encounter notes, upcoming appointments, etc.  Non-urgent messages can be sent to your provider as well.   To learn more about what you can do with MyChart, go to [email or web address].   Thank you for choosing [facility]!!   Maeola Domino, RN [phone]   Other Instructions  Cardioverter Defibrillator Implantation An implantable cardioverter defibrillator (ICD) is a device that detects abnormal heart rhythms, also called arrhythmias. When the ICD senses a heart rhythm that's not normal, it sends an electrical signal to get the heartbeat back into a normal rhythm. In the implantation surgery, the ICD is placed under the skin in your chest or  abdomen. An ICD has a battery and a small computer called a pulse generator. It also has wires, called leads, that go into the heart. The ICD detects and corrects two types of dangerous heart rhythms: Ventricular tachycardia. This is a very fast heart rhythm in the lower chambers of the heart. These chambers are called the ventricles. Ventricular fibrillation. This is when the ventricles contract in an uncoordinated way. Your health care provider may suggest an ICD if: You had an abnormal heart rhythm that started in the ventricles. Your heart has damage from a disease or heart condition. Your heart muscle is weak. You had a cardiac arrest before. You have: A congenital heart defect. This is a heart problem you were born with. You have other health problems that can affect your heart's electrical system. Tell a health care provider about: Any allergies you have. All medicines you're taking. These include vitamins, herbs, eye drops, creams, and over-the-counter medicines. Any problems you or family members have had with anesthesia. Any bleeding problems you have. Any surgeries you have had. Any medical conditions you have. Whether you're pregnant or may be pregnant. What are the risks? Your provider will talk with you about risks. These may include: Infection. Bleeding. Allergic reactions to medicines. Blood clots. Swelling or bruising. Damage to nearby structures or organs. These might be nerves, lungs, blood vessels, or the heart where parts of the ICD are placed. What happens before the procedure? When to stop eating and drinking Follow instructions from your provider about what you may eat and drink. These may include: 8 hours before your  procedure Stop eating most foods. Do not eat meat, fried foods, or fatty foods. Eat only light foods, such as toast or crackers. All liquids are okay except energy drinks and alcohol. 6 hours before your procedure Stop eating. Drink only  clear liquids, such as water, clear fruit juice, black coffee, plain tea, and sports drinks. Do not drink energy drinks or alcohol. 2 hours before your procedure Stop drinking all liquids. You may be allowed to take medicines with small sips of water. If you don't follow your provider's instructions, your procedure may be delayed or canceled. Medicines Ask your provider about: Changing or stopping your regular medicines. These include any diabetes medicines or blood thinners you take. Taking medicines such as aspirin  and ibuprofen. These medicines can thin your blood. Do not take them unless your provider tells you to. Taking over-the-counter medicines, vitamins, herbs, and supplements. Tests You may have an exam or testing. These may include: Blood tests. Electrocardiogram (ECG). This test records the electrical signals in your heart. Imaging tests, such as a chest X-ray. Echocardiogram. This uses sound waves to make pictures of your heart. An event monitor or Holter monitor to wear at home. These track your heart rhythm. General instructions Do not use any products that contain nicotine or tobacco for at least 4 weeks before the procedure. These products include cigarettes, chewing tobacco, and vaping devices, such as e-cigarettes. If you need help quitting, ask your provider. Ask your provider: How your procedure site will be marked. What steps will be taken to help prevent infection. These may include: Removing hair at the surgery site. Washing skin with a soap that kills germs. Taking antibiotics. If you'll be going home right after the procedure, plan to have a responsible adult: Take you home from the hospital or clinic. You won't be allowed to drive. Care for you for the time you are told. What happens during the procedure?  Monitors will be put on your body. They will be used to check your heart rate, blood pressure, and oxygen level. A pair of sticky pads, called  defibrillator pads, may be placed on your back and chest. These pads can pace your heart as needed during the procedure. An IV will be inserted into one of your veins. You may be given: A sedative. This helps you relax. Anesthesia. This keeps you from feeling pain. It will make you fall asleep for surgery. A small incision will be made to create a deep pocket under the skin of your chest or abdomen. Leads will be guided through a blood vessel into your heart and attached to your heart muscles. An X-ray machine called a fluoroscope will be used to help guide the leads. Depending on the ICD, the leads may go into one ventricle, or they may go into both ventricles and into an upper chamber of the heart. The other end of the leads will be attached to the ICD's pulse generator. The pulse generator will be placed into the pocket under your skin. The ICD will be tested, and your provider will program the ICD for the condition being treated. The incision will be closed with stitches, skin glue, tape strips, or staples. A bandage will be placed over the incision. The procedure may vary among providers and hospitals. What happens after the procedure? Your blood pressure, heart rate, breathing rate, and blood oxygen level will be monitored until you leave the hospital or clinic. A chest X-ray will be done to check the ICD. Do not raise  your arm higher than your shoulder for as long as told. This is usually at least 6 weeks. You may be given an ID card that shows you have an ICD. You'll be given a remote home monitoring device to use with your ICD. It allows your device to communicate with your provider. Do not drive until your provider says it's safe. This information is not intended to replace advice given to you by your health care provider. Make sure you discuss any questions you have with your health care provider. Document Revised: 08/14/2022 Document Reviewed: 08/14/2022 Elsevier Patient Education   2024 [employer].

## 2024-03-17 NOTE — Progress Notes (Signed)
  Electrophysiology Office Note:   Date:  03/17/2024  ID:  Caleb [name], DOB 08/28/1960, MRN [id number]  Primary Cardiologist: Jerel Balding, MD Primary Heart Failure: None Electrophysiologist: None      History of Present Illness:   Caleb [name] is a 63 y.o. male with h/o AAA, thoracic aortic aneurysm, chronic systolic heart failure, hypertension, CVA seen today for  for Electrophysiology evaluation of chronic systolic heart failure at the request of Josefa Beauvais.    He is post Bentall with left atrial appendage clip in 2019.  He has a moderate aortic regurgitation.  He presented to cardiology clinic 04/17/2023 for evaluation.  He was physically active doing resistance training.  On follow-up, he continued to feel well.  He continues to do resistance training 3-4 times per week.  He eats a low-sodium diet.  He is for the most part able to do all of his daily activities.  He has no chest pain, and only very mild shortness of breath.  He goes to the gym and lifts weights.  He does not do any aerobic activity due to his heart issues.  Review of systems complete and found to be negative unless listed in HPI.   EP Information / Studies Reviewed:    EKG is ordered today. Personal review as below.       Risk Assessment/Calculations:            Physical Exam:   VS:  BP 108/64 (BP Location: Left Arm, Patient Position: Sitting, Cuff Size: Large)   Pulse (!) 106   Ht 6' 3 (1.905 m)   Wt 251 lb (113.9 kg)   SpO2 97%   BMI 31.37 kg/m    Wt Readings from Last 3 Encounters:  03/17/24 251 lb (113.9 kg)  11/13/23 263 lb (119.3 kg)  11/01/23 264 lb 1.8 oz (119.8 kg)     GEN: Well nourished, well developed in no acute distress NECK: No JVD; No carotid bruits CARDIAC: Irregularly irregular rate and rhythm, no murmurs, rubs, gallops RESPIRATORY:  Clear to auscultation without rales, wheezing or rhonchi  ABDOMEN: Soft, non-tender, non-distended EXTREMITIES:  No edema; No deformity    ASSESSMENT AND PLAN:    1.  Chronic systolic heart failure: Due to nonischemic cardiomyopathy.  Continues to be quite active.  On medical therapy per primary cardiology.  He is potentially interested in ICD therapy.  We did discuss the reason for primary prevention.  At this point, he would like to think about this further.  He Latitia Housewright let us  know if he wants to proceed.  2.  Permanent atrial fibrillation: On Xarelto .  Well rate controlled.  3.  Hypertension: Well-controlled  Follow up with Dr. Inocencio pending decision on ICD   Signed, Divinity Kyler Gladis Inocencio, MD

## 2024-03-28 ENCOUNTER — Other Ambulatory Visit: Payer: Self-pay

## 2024-03-28 MED ORDER — RIVAROXABAN 20 MG PO TABS: 20.0000 mg | ORAL_TABLET | Freq: Every day | ORAL | 1 refills | Status: DC

## 2024-03-28 NOTE — Telephone Encounter (Signed)
 Prescription refill request for Xarelto  received.  Indication:afib Last office visit:10/25 Weight:113.9  kg Age:63 Scr:1.10  5/25 CrCl:109.3  ml/min  Prescription  refilled

## 2024-04-12 ENCOUNTER — Other Ambulatory Visit: Payer: Self-pay

## 2024-04-12 ENCOUNTER — Inpatient Hospital Stay (HOSPITAL_COMMUNITY)
Admission: EM | Admit: 2024-04-12 | Discharge: 2024-04-16 | DRG: 291 | Discharge status: Against Medical Advice | Attending: Internal Medicine | Admitting: Internal Medicine

## 2024-04-12 ENCOUNTER — Emergency Department (HOSPITAL_COMMUNITY)

## 2024-04-12 ENCOUNTER — Encounter (HOSPITAL_COMMUNITY): Payer: Self-pay

## 2024-04-12 DIAGNOSIS — E785 Hyperlipidemia, unspecified: Secondary | ICD-10-CM | POA: Diagnosis present

## 2024-04-12 DIAGNOSIS — I130 Hypertensive heart and chronic kidney disease with heart failure and stage 1 through stage 4 chronic kidney disease, or unspecified chronic kidney disease: Principal | ICD-10-CM | POA: Diagnosis present

## 2024-04-12 DIAGNOSIS — I428 Other cardiomyopathies: Secondary | ICD-10-CM | POA: Diagnosis present

## 2024-04-12 DIAGNOSIS — Z8249 Family history of ischemic heart disease and other diseases of the circulatory system: Secondary | ICD-10-CM

## 2024-04-12 DIAGNOSIS — J129 Viral pneumonia, unspecified: Secondary | ICD-10-CM | POA: Diagnosis present

## 2024-04-12 DIAGNOSIS — I5023 Acute on chronic systolic (congestive) heart failure: Secondary | ICD-10-CM | POA: Diagnosis present

## 2024-04-12 DIAGNOSIS — J189 Pneumonia, unspecified organism: Secondary | ICD-10-CM | POA: Diagnosis not present

## 2024-04-12 DIAGNOSIS — Z952 Presence of prosthetic heart valve: Secondary | ICD-10-CM | POA: Diagnosis not present

## 2024-04-12 DIAGNOSIS — T45516A Underdosing of anticoagulants, initial encounter: Secondary | ICD-10-CM | POA: Diagnosis present

## 2024-04-12 DIAGNOSIS — N1831 Chronic kidney disease, stage 3a: Secondary | ICD-10-CM | POA: Diagnosis present

## 2024-04-12 DIAGNOSIS — I480 Paroxysmal atrial fibrillation: Secondary | ICD-10-CM | POA: Diagnosis present

## 2024-04-12 DIAGNOSIS — Z79899 Other long term (current) drug therapy: Secondary | ICD-10-CM | POA: Diagnosis not present

## 2024-04-12 DIAGNOSIS — I10 Essential (primary) hypertension: Secondary | ICD-10-CM | POA: Diagnosis not present

## 2024-04-12 DIAGNOSIS — Z91148 Patient's other noncompliance with medication regimen for other reason: Secondary | ICD-10-CM

## 2024-04-12 DIAGNOSIS — T501X6A Underdosing of loop [high-ceiling] diuretics, initial encounter: Secondary | ICD-10-CM | POA: Diagnosis present

## 2024-04-12 DIAGNOSIS — Z8673 Personal history of transient ischemic attack (TIA), and cerebral infarction without residual deficits: Secondary | ICD-10-CM | POA: Diagnosis not present

## 2024-04-12 DIAGNOSIS — Z953 Presence of xenogenic heart valve: Secondary | ICD-10-CM | POA: Diagnosis not present

## 2024-04-12 DIAGNOSIS — I5022 Chronic systolic (congestive) heart failure: Secondary | ICD-10-CM | POA: Diagnosis not present

## 2024-04-12 DIAGNOSIS — Z833 Family history of diabetes mellitus: Secondary | ICD-10-CM

## 2024-04-12 DIAGNOSIS — Z7984 Long term (current) use of oral hypoglycemic drugs: Secondary | ICD-10-CM

## 2024-04-12 DIAGNOSIS — Z7901 Long term (current) use of anticoagulants: Secondary | ICD-10-CM | POA: Diagnosis not present

## 2024-04-12 DIAGNOSIS — I4891 Unspecified atrial fibrillation: Secondary | ICD-10-CM | POA: Diagnosis present

## 2024-04-12 DIAGNOSIS — I5033 Acute on chronic diastolic (congestive) heart failure: Secondary | ICD-10-CM | POA: Diagnosis present

## 2024-04-12 DIAGNOSIS — R0600 Dyspnea, unspecified: Principal | ICD-10-CM

## 2024-04-12 DIAGNOSIS — I4821 Permanent atrial fibrillation: Secondary | ICD-10-CM | POA: Diagnosis not present

## 2024-04-12 LAB — TROPONIN T, HIGH SENSITIVITY
Troponin T High Sensitivity: 24 ng/L — ABNORMAL HIGH (ref 0–19)
Troponin T High Sensitivity: 25 ng/L — ABNORMAL HIGH (ref 0–19)

## 2024-04-12 LAB — BASIC METABOLIC PANEL WITH GFR
Anion gap: 10 (ref 5–15)
BUN: 20 mg/dL (ref 8–23)
CO2: 22 mmol/L (ref 22–32)
Calcium: 8.6 mg/dL — ABNORMAL LOW (ref 8.9–10.3)
Chloride: 104 mmol/L (ref 98–111)
Creatinine, Ser: 1.13 mg/dL (ref 0.61–1.24)
GFR, Estimated: >60 mL/min (ref >60)
Glucose, Bld: 113 mg/dL — ABNORMAL HIGH (ref 70–99)
Potassium: 4 mmol/L (ref 3.5–5.1)
Sodium: 135 mmol/L (ref 135–145)

## 2024-04-12 LAB — CBC
HCT: 40.7 % (ref 39.0–52.0)
Hemoglobin: 13.3 g/dL (ref 13.0–17.0)
MCH: 28.4 pg (ref 26.0–34.0)
MCHC: 32.7 g/dL (ref 30.0–36.0)
MCV: 87 fL (ref 80.0–100.0)
Platelets: 155 K/uL (ref 150–400)
RBC: 4.68 MIL/uL (ref 4.22–5.81)
RDW: 16.3 % — ABNORMAL HIGH (ref 11.5–15.5)
WBC: 6.9 K/uL (ref 4.0–10.5)
nRBC: 0 % (ref 0.0–0.2)

## 2024-04-12 LAB — I-STAT CG4 LACTIC ACID, ED: Lactic Acid, Venous: 0.7 mmol/L (ref 0.5–1.9)

## 2024-04-12 LAB — PRO BRAIN NATRIURETIC PEPTIDE: Pro Brain Natriuretic Peptide: 4996 pg/mL — ABNORMAL HIGH (ref <300.0)

## 2024-04-12 MED ORDER — ONDANSETRON HCL 4 MG/2ML IJ SOLN
4.0000 mg | Freq: Four times a day (QID) | INTRAMUSCULAR | Status: DC | PRN
Start: 2024-04-12 — End: 2024-04-16

## 2024-04-12 MED ORDER — SODIUM CHLORIDE 0.9 % IV SOLN
500.0000 mg | INTRAVENOUS | Status: DC
  Administered 2024-04-12: 500 mg via INTRAVENOUS
  Filled 2024-04-12: qty 5

## 2024-04-12 MED ORDER — ACETAMINOPHEN 650 MG RE SUPP: 650.0000 mg | Freq: Four times a day (QID) | RECTAL | Status: DC | PRN

## 2024-04-12 MED ORDER — BISACODYL 5 MG PO TBEC: 5.0000 mg | DELAYED_RELEASE_TABLET | Freq: Every day | ORAL | Status: DC | PRN

## 2024-04-12 MED ORDER — ONDANSETRON HCL 4 MG PO TABS: 4.0000 mg | ORAL_TABLET | Freq: Four times a day (QID) | ORAL | Status: DC | PRN

## 2024-04-12 MED ORDER — SODIUM CHLORIDE 0.9 % IV SOLN
1.0000 g | INTRAVENOUS | Status: DC
  Administered 2024-04-12: 1 g via INTRAVENOUS
  Filled 2024-04-12: qty 10

## 2024-04-12 MED ORDER — ACETAMINOPHEN 325 MG PO TABS
975.0000 mg | ORAL_TABLET | Freq: Once | ORAL | Status: AC
  Administered 2024-04-12: 975 mg via ORAL
  Filled 2024-04-12: qty 3

## 2024-04-12 MED ORDER — ACETAMINOPHEN 325 MG PO TABS
650.0000 mg | ORAL_TABLET | Freq: Four times a day (QID) | ORAL | Status: DC | PRN
  Administered 2024-04-13: 650 mg via ORAL
  Filled 2024-04-12: qty 2

## 2024-04-12 MED ORDER — RIVAROXABAN 20 MG PO TABS
20.0000 mg | ORAL_TABLET | Freq: Every day | ORAL | Status: DC
  Administered 2024-04-12 – 2024-04-15 (×4): 20 mg via ORAL
  Filled 2024-04-12 (×4): qty 1

## 2024-04-12 MED ORDER — FUROSEMIDE 10 MG/ML IJ SOLN
60.0000 mg | Freq: Once | INTRAMUSCULAR | Status: AC
  Administered 2024-04-12: 60 mg via INTRAVENOUS
  Filled 2024-04-12: qty 8

## 2024-04-12 MED ORDER — POTASSIUM CHLORIDE CRYS ER 20 MEQ PO TBCR
20.0000 meq | EXTENDED_RELEASE_TABLET | Freq: Every day | ORAL | Status: DC
  Administered 2024-04-12 – 2024-04-15 (×4): 20 meq via ORAL
  Filled 2024-04-12 (×4): qty 1

## 2024-04-12 MED ORDER — SENNOSIDES-DOCUSATE SODIUM 8.6-50 MG PO TABS
1.0000 | ORAL_TABLET | Freq: Every evening | ORAL | Status: DC | PRN
  Administered 2024-04-14: 1 via ORAL
  Filled 2024-04-12: qty 1

## 2024-04-12 NOTE — ED Triage Notes (Signed)
 Pt to er, pt states that he is here for a cough and shortness of breath, states that he has had the cough for the past two days.  States that he hasn't had his fluid pill for the past 10 days.  Reports general fatigue.
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
## 2024-04-12 NOTE — ED Provider Notes (Signed)
 [place] EMERGENCY DEPARTMENT AT [facility] Provider Note   CSN: [id number] Arrival date & time: 04/12/24  1715     Patient presents with: Cough   Caleb [name] is a 63 y.o. male.   This is a 63 year old male who presents with several days of cough and congestion.  His daughter who is at bedside states he has been out of his medications for the past 10 days.  Does have a history of A-fib as well as CHF.  Endorses increased bilateral lower extremity pitting edema.  Has not had his Xarelto  for that time.  Denies any vomiting or diarrhea but has had a fever.  No abdominal discomfort.  Some increased dyspnea on exertion.  Denies any anginal type chest pain.       Prior to Admission medications   Medication Sig Start Date End Date Taking? Authorizing Provider  Ascorbic Acid (VITAMIN C PO) Take 1 tablet by mouth daily.    [provider]  empagliflozin  (JARDIANCE ) 10 MG TABS tablet Take 1 tablet (10 mg total) by mouth daily. 04/13/23   Croitoru, Mihai, MD  L-ARGININE PO Take 1 tablet by mouth daily.    [provider]  NON FORMULARY Take 1 capsule by mouth See admin instructions. Sea Moss- Take 1 capsule by mouth once a day    [provider]  potassium chloride  SA (KLOR-CON  M) 20 MEQ tablet Take 1 tablet (20 mEq total) by mouth daily. 05/14/23   Croitoru, Mihai, MD  rivaroxaban  (XARELTO ) 20 MG TABS tablet Take 1 tablet (20 mg total) by mouth daily with supper. 03/28/24   Croitoru, Mihai, MD  rosuvastatin  (CRESTOR ) 20 MG tablet Take 1 tablet (20 mg total) by mouth daily. 05/14/23   Croitoru, Mihai, MD  sildenafil  (VIAGRA ) 100 MG tablet Take 0.5-1 tablets (50-100 mg total) by mouth daily as needed for erectile dysfunction. 11/13/23   Oley Bascom RAMAN, NP  torsemide  (DEMADEX ) 20 MG tablet Take 1 tablet (20 mg total) by mouth daily. 05/14/23   Croitoru, Mihai, MD  Vitamin D-Vitamin K (VITAMIN K2-VITAMIN D3 PO) Take 1 tablet by mouth daily.    [provider]  zinc gluconate 50 MG tablet Take 50 mg by mouth daily.    [provider]    Allergies: Patient has no known allergies.    Review of Systems  All other systems reviewed and are negative.   Updated Vital Signs BP 132/88 (BP Location: Left Arm)   Pulse (!) 112   Temp (!) 100.7 F (38.2 C) (Oral)   Resp (!) 31   Ht 1.905 m (6' 3)   Wt 108.9 kg   SpO2 95%   BMI 30.00 kg/m   Physical Exam Vitals and nursing note reviewed.  Constitutional:      General: He is not in acute distress.    Appearance: Normal appearance. He is well-developed. He is not toxic-appearing.  HENT:     Head: Normocephalic and atraumatic.  Eyes:     General: Lids are normal.     Conjunctiva/sclera: Conjunctivae normal.     Pupils: Pupils are equal, round, and reactive to light.  Neck:     Thyroid: No thyroid mass.     Trachea: No tracheal deviation.  Cardiovascular:     Rate and Rhythm: Normal rate and regular rhythm.     Heart sounds: Normal heart sounds. No murmur heard.    No gallop.  Pulmonary:     Effort: Tachypnea and prolonged expiration  present. No respiratory distress.     Breath sounds: No stridor. Examination of the right-lower field reveals decreased breath sounds. Examination of the left-lower field reveals decreased breath sounds. Decreased breath sounds present. No wheezing, rhonchi or rales.  Abdominal:     General: There is no distension.     Palpations: Abdomen is soft.     Tenderness: There is no abdominal tenderness. There is no rebound.  Musculoskeletal:        General: No tenderness. Normal range of motion.     Cervical back: Normal range of motion and neck supple.  Skin:    General: Skin is warm and dry.     Findings: No abrasion or rash.  Neurological:     Mental Status: He is alert and oriented to person, place, and time. Mental status is at baseline.     GCS: GCS eye subscore is 4. GCS verbal subscore is 5. GCS motor subscore is 6.     Cranial  Nerves: No cranial nerve deficit.     Sensory: No sensory deficit.     Motor: Motor function is intact.  Psychiatric:        Attention and Perception: Attention normal.        Speech: Speech normal.        Behavior: Behavior normal.     (all labs ordered are listed, but only abnormal results are displayed) Labs Reviewed  BASIC METABOLIC PANEL WITH GFR - Abnormal; Notable for the following components:      Result Value   Glucose, Bld 113 (*)    Calcium  8.6 (*)    All other components within normal limits  CBC - Abnormal; Notable for the following components:   RDW 16.3 (*)    All other components within normal limits  PRO BRAIN NATRIURETIC PEPTIDE - Abnormal; Notable for the following components:   Pro Brain Natriuretic Peptide 4,996.0 (*)    All other components within normal limits  TROPONIN T, HIGH SENSITIVITY - Abnormal; Notable for the following components:   Troponin T High Sensitivity 24 (*)    All other components within normal limits  RESP PANEL BY RT-PCR (RSV, FLU A&B, COVID)  RVPGX2  I-STAT CG4 LACTIC ACID, ED    EKG: EKG Interpretation Date/Time:  Tuesday April 12 2024 17:35:45 EST Ventricular Rate:  115 PR Interval:    QRS Duration:  105 QT Interval:  336 QTC Calculation: 465 R Axis:   -33  Text Interpretation: Atrial fibrillation Left axis deviation Anterior infarct, old Nonspecific repol abnormality, lateral leads Confirmed by Dasie Faden ([id number]) on 04/12/2024 6:37:32 PM  Radiology: [facility] Chest 2 View Result Date: 04/12/2024 CLINICAL DATA:  SOB EXAM: CHEST - 2 VIEW COMPARISON:  Oct 30, 2023 FINDINGS: Bilateral perihilar interstitial opacities. No focal airspace consolidation, pleural effusion, or pneumothorax. Moderate cardiomegaly. Sternotomy wires and aortic valve replacement with a left atrial appendage clip. Tortuous aorta with aortic atherosclerosis. No acute fracture or destructive lesions. Multilevel thoracic osteophytosis. IMPRESSION: Moderate  cardiomegaly with findings of interstitial edema. Alternatively, this could reflect changes of an atypical/viral pneumonia. Electronically Signed   By: Rogelia Myers M.D.   On: 04/12/2024 18:10     Procedures   Medications Ordered in the ED  cefTRIAXone (ROCEPHIN) 1 g in sodium chloride  0.9 % 100 mL IVPB (has no administration in time range)  azithromycin (ZITHROMAX) 500 mg in sodium chloride  0.9 % 250 mL IVPB (has no administration in time range)  furosemide  (LASIX ) injection 60 mg (has no  administration in time range)  acetaminophen  (TYLENOL ) tablet 975 mg (975 mg Oral Given 04/12/24 1823)                                    Medical Decision Making Amount and/or Complexity of Data Reviewed Labs: ordered. Radiology: ordered.  Risk Prescription drug management.   Patient's EKG shows A-fib which is unchanged from prior.  Patient's chest x-ray consistent with fluid overload.  BNP confirms this as this severely elevated.  Patient also with temperature of 100.7.  Suspect the patient may have pneumonia as well 2.  Patient treated for CHF with 60 mg IV Lasix .  Started on empiric antibiotics with Rocephin and Zithromax for his pneumonia.  Blood cultures and lactate ordered.  Mild elevation of troponin 24.  Suspect some demand ischemia.  Will trend.  Patient will require admission will consult hospitalist team  CRITICAL CARE Performed by: Curtistine ONEIDA Dawn Total critical care time: 45 minutes Critical care time was exclusive of separately billable procedures and treating other patients. Critical care was necessary to treat or prevent imminent or life-threatening deterioration. Critical care was time spent personally by me on the following activities: development of treatment plan with patient and/or surrogate as well as nursing, discussions with consultants, evaluation of patient's response to treatment, examination of patient, obtaining history from patient or surrogate, ordering and  performing treatments and interventions, ordering and review of laboratory studies, ordering and review of radiographic studies, pulse oximetry and re-evaluation of patient's condition.      Final diagnoses:  None    ED Discharge Orders     None          Dawn Curtistine, MD 04/12/24 [phone]

## 2024-04-12 NOTE — ED Notes (Signed)
 Pt has refused Resp panel.

## 2024-04-12 NOTE — ED Notes (Signed)
 Patient refused COVID Resp panel

## 2024-04-13 DIAGNOSIS — I5022 Chronic systolic (congestive) heart failure: Secondary | ICD-10-CM

## 2024-04-13 DIAGNOSIS — E785 Hyperlipidemia, unspecified: Secondary | ICD-10-CM

## 2024-04-13 DIAGNOSIS — I4891 Unspecified atrial fibrillation: Secondary | ICD-10-CM | POA: Diagnosis not present

## 2024-04-13 DIAGNOSIS — Z8673 Personal history of transient ischemic attack (TIA), and cerebral infarction without residual deficits: Secondary | ICD-10-CM

## 2024-04-13 DIAGNOSIS — I4821 Permanent atrial fibrillation: Secondary | ICD-10-CM | POA: Diagnosis not present

## 2024-04-13 DIAGNOSIS — I5023 Acute on chronic systolic (congestive) heart failure: Secondary | ICD-10-CM | POA: Diagnosis not present

## 2024-04-13 DIAGNOSIS — I428 Other cardiomyopathies: Secondary | ICD-10-CM | POA: Diagnosis not present

## 2024-04-13 DIAGNOSIS — J189 Pneumonia, unspecified organism: Secondary | ICD-10-CM

## 2024-04-13 LAB — EXPECTORATED SPUTUM ASSESSMENT W GRAM STAIN, RFLX TO RESP C

## 2024-04-13 LAB — BASIC METABOLIC PANEL WITH GFR
Anion gap: 9 (ref 5–15)
BUN: 17 mg/dL (ref 8–23)
CO2: 26 mmol/L (ref 22–32)
Calcium: 8.9 mg/dL (ref 8.9–10.3)
Chloride: 103 mmol/L (ref 98–111)
Creatinine, Ser: 1.06 mg/dL (ref 0.61–1.24)
GFR, Estimated: >60 mL/min (ref >60)
Glucose, Bld: 94 mg/dL (ref 70–99)
Potassium: 3.9 mmol/L (ref 3.5–5.1)
Sodium: 139 mmol/L (ref 135–145)

## 2024-04-13 LAB — CBC
HCT: 42.9 % (ref 39.0–52.0)
Hemoglobin: 13.7 g/dL (ref 13.0–17.0)
MCH: 28.1 pg (ref 26.0–34.0)
MCHC: 31.9 g/dL (ref 30.0–36.0)
MCV: 88.1 fL (ref 80.0–100.0)
Platelets: 149 K/uL — ABNORMAL LOW (ref 150–400)
RBC: 4.87 MIL/uL (ref 4.22–5.81)
RDW: 16.4 % — ABNORMAL HIGH (ref 11.5–15.5)
WBC: 5.7 K/uL (ref 4.0–10.5)
nRBC: 0 % (ref 0.0–0.2)

## 2024-04-13 LAB — PROCALCITONIN: Procalcitonin: 0.1 ng/mL

## 2024-04-13 LAB — STREP PNEUMONIAE URINARY ANTIGEN: Strep Pneumo Urinary Antigen: NEGATIVE

## 2024-04-13 LAB — MAGNESIUM: Magnesium: 2.1 mg/dL (ref 1.7–2.4)

## 2024-04-13 LAB — HIV ANTIBODY (ROUTINE TESTING W REFLEX): HIV Screen 4th Generation wRfx: NONREACTIVE

## 2024-04-13 MED ORDER — FUROSEMIDE 10 MG/ML IJ SOLN
40.0000 mg | Freq: Two times a day (BID) | INTRAMUSCULAR | Status: DC
  Administered 2024-04-13 – 2024-04-14 (×3): 40 mg via INTRAVENOUS
  Filled 2024-04-13 (×3): qty 4

## 2024-04-13 MED ORDER — METOPROLOL TARTRATE 5 MG/5ML IV SOLN
5.0000 mg | Freq: Once | INTRAVENOUS | Status: AC
  Administered 2024-04-13: 5 mg via INTRAVENOUS
  Filled 2024-04-13: qty 5

## 2024-04-13 MED ORDER — EMPAGLIFLOZIN 10 MG PO TABS
10.0000 mg | ORAL_TABLET | Freq: Every day | ORAL | Status: DC
  Administered 2024-04-13 – 2024-04-15 (×3): 10 mg via ORAL
  Filled 2024-04-13 (×4): qty 1

## 2024-04-13 MED ORDER — SODIUM CHLORIDE 0.9 % IV SOLN
1.0000 g | INTRAVENOUS | Status: AC
  Administered 2024-04-13 – 2024-04-14 (×2): 1 g via INTRAVENOUS
  Filled 2024-04-13 (×2): qty 10

## 2024-04-13 MED ORDER — MELATONIN 5 MG PO TABS
5.0000 mg | ORAL_TABLET | Freq: Once | ORAL | Status: AC
  Administered 2024-04-13: 5 mg via ORAL
  Filled 2024-04-13: qty 1

## 2024-04-13 MED ORDER — GUAIFENESIN-DM 100-10 MG/5ML PO SYRP
5.0000 mL | ORAL_SOLUTION | ORAL | Status: DC | PRN
  Administered 2024-04-13 (×2): 5 mL via ORAL
  Filled 2024-04-13 (×2): qty 10

## 2024-04-13 MED ORDER — CARVEDILOL 3.125 MG PO TABS
3.1250 mg | ORAL_TABLET | Freq: Two times a day (BID) | ORAL | Status: DC
  Administered 2024-04-13 – 2024-04-14 (×3): 3.125 mg via ORAL
  Filled 2024-04-13 (×3): qty 1

## 2024-04-13 MED ORDER — ZINC GLUCONATE 50 MG PO TABS: 50.0000 mg | ORAL_TABLET | Freq: Every day | ORAL | Status: DC

## 2024-04-13 MED ORDER — ROSUVASTATIN CALCIUM 20 MG PO TABS
20.0000 mg | ORAL_TABLET | Freq: Every day | ORAL | Status: DC
  Filled 2024-04-13 (×2): qty 1

## 2024-04-13 MED ORDER — AZITHROMYCIN 500 MG IV SOLR
500.0000 mg | INTRAVENOUS | Status: AC
  Administered 2024-04-13 – 2024-04-14 (×2): 500 mg via INTRAVENOUS
  Filled 2024-04-13 (×2): qty 5
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
## 2024-04-13 NOTE — Plan of Care (Signed)

## 2024-04-14 DIAGNOSIS — I5023 Acute on chronic systolic (congestive) heart failure: Secondary | ICD-10-CM | POA: Diagnosis not present

## 2024-04-14 DIAGNOSIS — I5022 Chronic systolic (congestive) heart failure: Secondary | ICD-10-CM | POA: Diagnosis not present

## 2024-04-14 DIAGNOSIS — Z952 Presence of prosthetic heart valve: Secondary | ICD-10-CM | POA: Diagnosis not present

## 2024-04-14 DIAGNOSIS — I428 Other cardiomyopathies: Secondary | ICD-10-CM | POA: Diagnosis not present

## 2024-04-14 DIAGNOSIS — I480 Paroxysmal atrial fibrillation: Secondary | ICD-10-CM | POA: Diagnosis not present

## 2024-04-14 DIAGNOSIS — I4821 Permanent atrial fibrillation: Secondary | ICD-10-CM | POA: Diagnosis not present

## 2024-04-14 DIAGNOSIS — I5033 Acute on chronic diastolic (congestive) heart failure: Secondary | ICD-10-CM | POA: Diagnosis present

## 2024-04-14 DIAGNOSIS — J189 Pneumonia, unspecified organism: Secondary | ICD-10-CM | POA: Diagnosis not present

## 2024-04-14 LAB — CBC WITH DIFFERENTIAL/PLATELET
Abs Immature Granulocytes: 0.02 K/uL (ref 0.00–0.07)
Basophils Absolute: 0.1 K/uL (ref 0.0–0.1)
Basophils Relative: 1 %
Eosinophils Absolute: 0.2 K/uL (ref 0.0–0.5)
Eosinophils Relative: 4 %
HCT: 46.6 % (ref 39.0–52.0)
Hemoglobin: 15.1 g/dL (ref 13.0–17.0)
Immature Granulocytes: 0 %
Lymphocytes Relative: 30 %
Lymphs Abs: 1.6 K/uL (ref 0.7–4.0)
MCH: 27.8 pg (ref 26.0–34.0)
MCHC: 32.4 g/dL (ref 30.0–36.0)
MCV: 85.7 fL (ref 80.0–100.0)
Monocytes Absolute: 0.7 K/uL (ref 0.1–1.0)
Monocytes Relative: 12 %
Neutro Abs: 2.9 K/uL (ref 1.7–7.7)
Neutrophils Relative %: 53 %
Platelets: 174 K/uL (ref 150–400)
RBC: 5.44 MIL/uL (ref 4.22–5.81)
RDW: 16.2 % — ABNORMAL HIGH (ref 11.5–15.5)
WBC: 5.6 K/uL (ref 4.0–10.5)
nRBC: 0 % (ref 0.0–0.2)

## 2024-04-14 LAB — BASIC METABOLIC PANEL WITH GFR
Anion gap: 11 (ref 5–15)
BUN: 21 mg/dL (ref 8–23)
CO2: 27 mmol/L (ref 22–32)
Calcium: 9.3 mg/dL (ref 8.9–10.3)
Chloride: 99 mmol/L (ref 98–111)
Creatinine, Ser: 1.21 mg/dL (ref 0.61–1.24)
GFR, Estimated: >60 mL/min (ref >60)
Glucose, Bld: 93 mg/dL (ref 70–99)
Potassium: 3.7 mmol/L (ref 3.5–5.1)
Sodium: 136 mmol/L (ref 135–145)

## 2024-04-14 LAB — MAGNESIUM: Magnesium: 2.2 mg/dL (ref 1.7–2.4)

## 2024-04-14 LAB — LEGIONELLA PNEUMOPHILA SEROGP 1 UR AG: L. pneumophila Serogp 1 Ur Ag: NEGATIVE

## 2024-04-14 MED ORDER — FUROSEMIDE 40 MG PO TABS
40.0000 mg | ORAL_TABLET | Freq: Two times a day (BID) | ORAL | Status: DC
  Administered 2024-04-14: 40 mg via ORAL
  Filled 2024-04-14 (×2): qty 1

## 2024-04-14 MED ORDER — DIGOXIN 125 MCG PO TABS
0.2500 mg | ORAL_TABLET | Freq: Every day | ORAL | Status: DC
  Administered 2024-04-14 – 2024-04-15 (×2): 0.25 mg via ORAL
  Filled 2024-04-14 (×2): qty 2

## 2024-04-14 MED ORDER — GUAIFENESIN-DM 100-10 MG/5ML PO SYRP
5.0000 mL | ORAL_SOLUTION | ORAL | Status: DC | PRN
  Administered 2024-04-14: 5 mL via ORAL
  Filled 2024-04-14: qty 10

## 2024-04-14 MED ORDER — CEFADROXIL 500 MG PO CAPS
1000.0000 mg | ORAL_CAPSULE | Freq: Two times a day (BID) | ORAL | Status: DC
  Administered 2024-04-15: 1000 mg via ORAL
  Filled 2024-04-14 (×2): qty 2

## 2024-04-14 MED ORDER — LOSARTAN POTASSIUM 25 MG PO TABS
25.0000 mg | ORAL_TABLET | Freq: Every day | ORAL | Status: DC
  Administered 2024-04-14 – 2024-04-15 (×2): 25 mg via ORAL
  Filled 2024-04-14 (×2): qty 1

## 2024-04-14 MED ORDER — CARVEDILOL 12.5 MG PO TABS
12.5000 mg | ORAL_TABLET | Freq: Two times a day (BID) | ORAL | Status: DC
  Administered 2024-04-14 – 2024-04-15 (×2): 12.5 mg via ORAL
  Filled 2024-04-14 (×3): qty 1

## 2024-04-14 MED ORDER — GUAIFENESIN ER 600 MG PO TB12
1200.0000 mg | ORAL_TABLET | Freq: Two times a day (BID) | ORAL | Status: DC
  Administered 2024-04-14 – 2024-04-15 (×3): 1200 mg via ORAL
  Filled 2024-04-14 (×4): qty 2

## 2024-04-14 NOTE — Progress Notes (Signed)
   Cardiologist:  Croitoru and Camnitz  Subjective:   Cough and dyspnea better AFib rate still high  Objective:  Vitals:   04/13/24 1030 04/13/24 1352 04/13/24 2043 04/14/24 0512  BP: 105/84 (!) 133/100 (!) 108/95 (!) 120/95  Pulse: 67 74 (!) 42 (!) 45  Resp: 18 16 20 20   Temp: 98.7 F (37.1 C) 98.2 F (36.8 C) 98 F (36.7 C) 98 F (36.7 C)  TempSrc: Oral Oral Oral Oral  SpO2: 97% 96% 99% 99%  Weight:    110.4 kg  Height:        Intake/Output from previous day:  Intake/Output Summary (Last 24 hours) at 04/14/2024 1017 Last data filed at 04/14/2024 1000 Gross per 24 hour  Intake 890 ml  Output 2825 ml  Net -1935 ml    Physical Exam:  No distress Lungs clear Apical MR murmur Abdomen benign Plus one bilateral edema improved   Lab Results: Basic Metabolic Panel: Recent Labs    04/13/24 0428 04/14/24 0428  NA 139 136  K 3.9 3.7  CL 103 99  CO2 26 27  GLUCOSE 94 93  BUN 17 21  CREATININE 1.06 1.21  CALCIUM  8.9 9.3  MG 2.1 2.2    CBC: Recent Labs    04/13/24 0428 04/14/24 0428  WBC 5.7 5.6  NEUTROABS  --  2.9  HGB 13.7 15.1  HCT 42.9 46.6  MCV 88.1 85.7  PLT 149* 174     Imaging: DG Chest 2 View Result Date: 04/12/2024 CLINICAL DATA:  SOB EXAM: CHEST - 2 VIEW COMPARISON:  Oct 30, 2023 FINDINGS: Bilateral perihilar interstitial opacities. No focal airspace consolidation, pleural effusion, or pneumothorax. Moderate cardiomegaly. Sternotomy wires and aortic valve replacement with a left atrial appendage clip. Tortuous aorta with aortic atherosclerosis. No acute fracture or destructive lesions. Multilevel thoracic osteophytosis. IMPRESSION: Moderate cardiomegaly with findings of interstitial edema. Alternatively, this could reflect changes of an atypical/viral pneumonia. Electronically Signed   By: Rogelia Myers M.D.   On: 04/12/2024 18:10    Cardiac Studies:  ECG: afib nonspecific ST changes QRS 100 msec   Telemetry: afib 100 -130 bpm  Echo:  11/30/23 EF 25-30% moderate MR   Medications:    carvedilol   12.5 mg Oral BID WC   digoxin   0.25 mg Oral Daily   empagliflozin   10 mg Oral Daily   furosemide   40 mg Oral BID   potassium chloride  SA  20 mEq Oral Daily   rivaroxaban   20 mg Oral Q supper   rosuvastatin   20 mg Oral Daily      azithromycin 500 mg (04/13/24 1834)   cefTRIAXone (ROCEPHIN)  IV 1 g (04/13/24 1834)    Assessment/Plan:   CHF:  chronic systolic CHF. Without appropriate meds during 22 day incarceration. Resume home meds and continue lasix  40 mg iv bid Good diuresis 1.9 L's BP better start low dose cozaar  25 mg daily Afib:  Increase coreg  to 12.5 mg bid and add digoxin  for better rate control l Has had LAA clip on Xarelto  AVR:  normal function on last TTE June this year normal exam AICD:  being considered for outpatient f/u with Dr Inocencio. QRS narrow not a candidate for CRT   Maude Emmer 04/14/2024, 10:17 AM
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
## 2024-04-15 DIAGNOSIS — I5023 Acute on chronic systolic (congestive) heart failure: Secondary | ICD-10-CM | POA: Diagnosis not present

## 2024-04-15 DIAGNOSIS — J189 Pneumonia, unspecified organism: Secondary | ICD-10-CM | POA: Diagnosis not present

## 2024-04-15 DIAGNOSIS — I428 Other cardiomyopathies: Secondary | ICD-10-CM | POA: Diagnosis not present

## 2024-04-15 DIAGNOSIS — I4821 Permanent atrial fibrillation: Secondary | ICD-10-CM | POA: Diagnosis not present

## 2024-04-15 DIAGNOSIS — I5022 Chronic systolic (congestive) heart failure: Secondary | ICD-10-CM | POA: Diagnosis not present

## 2024-04-15 LAB — CBC WITH DIFFERENTIAL/PLATELET
Abs Immature Granulocytes: 0 K/uL (ref 0.00–0.07)
Basophils Absolute: 0.1 K/uL (ref 0.0–0.1)
Basophils Relative: 1 %
Eosinophils Absolute: 0.3 K/uL (ref 0.0–0.5)
Eosinophils Relative: 6 %
HCT: 47.6 % (ref 39.0–52.0)
Hemoglobin: 15.5 g/dL (ref 13.0–17.0)
Immature Granulocytes: 0 %
Lymphocytes Relative: 34 %
Lymphs Abs: 1.5 K/uL (ref 0.7–4.0)
MCH: 28 pg (ref 26.0–34.0)
MCHC: 32.6 g/dL (ref 30.0–36.0)
MCV: 86.1 fL (ref 80.0–100.0)
Monocytes Absolute: 0.5 K/uL (ref 0.1–1.0)
Monocytes Relative: 12 %
Neutro Abs: 2.1 K/uL (ref 1.7–7.7)
Neutrophils Relative %: 47 %
Platelets: 163 K/uL (ref 150–400)
RBC: 5.53 MIL/uL (ref 4.22–5.81)
RDW: 16.1 % — ABNORMAL HIGH (ref 11.5–15.5)
WBC: 4.5 K/uL (ref 4.0–10.5)
nRBC: 0 % (ref 0.0–0.2)

## 2024-04-15 LAB — BASIC METABOLIC PANEL WITH GFR
Anion gap: 11 (ref 5–15)
BUN: 22 mg/dL (ref 8–23)
CO2: 27 mmol/L (ref 22–32)
Calcium: 8.7 mg/dL — ABNORMAL LOW (ref 8.9–10.3)
Chloride: 100 mmol/L (ref 98–111)
Creatinine, Ser: 1.21 mg/dL (ref 0.61–1.24)
GFR, Estimated: >60 mL/min (ref >60)
Glucose, Bld: 90 mg/dL (ref 70–99)
Potassium: 3.7 mmol/L (ref 3.5–5.1)
Sodium: 138 mmol/L (ref 135–145)

## 2024-04-15 LAB — MAGNESIUM: Magnesium: 2.3 mg/dL (ref 1.7–2.4)

## 2024-04-15 MED ORDER — IPRATROPIUM-ALBUTEROL 0.5-2.5 (3) MG/3ML IN SOLN
3.0000 mL | Freq: Three times a day (TID) | RESPIRATORY_TRACT | Status: DC
  Administered 2024-04-15 – 2024-04-16 (×3): 3 mL via RESPIRATORY_TRACT
  Filled 2024-04-15 (×3): qty 3

## 2024-04-15 MED ORDER — FUROSEMIDE 20 MG PO TABS: 20.0000 mg | ORAL_TABLET | Freq: Every day | ORAL | Status: DC

## 2024-04-15 MED ORDER — FUROSEMIDE 40 MG PO TABS: 40.0000 mg | ORAL_TABLET | Freq: Every day | ORAL | Status: DC

## 2024-04-15 NOTE — Progress Notes (Signed)
 Nurse Tech told RN that it smelled like patient had been smoking something in the room. When RN went to assess, patient was sitting next to window with the window open. RN reminded patient that smoking was not permitted. Patient had told RN that he had finished. RN Physiological Scientist. [facility] and Security were also notified.
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
## 2024-04-15 NOTE — Progress Notes (Signed)
   Cardiologist:  Croitoru and Camnitz  Subjective:   Lightheaded BP low this am will need to hold diuretics today  Objective:  Vitals:   04/14/24 1328 04/14/24 2012 04/15/24 0524 04/15/24 0842  BP: 112/85 100/83 108/76 94/71  Pulse: (!) 108 99 86 90  Resp: 17 20 16    Temp: (!) 97.4 F (36.3 C) 97.8 F (36.6 C) (!) 97.5 F (36.4 C)   TempSrc: Oral Oral Oral   SpO2: 97% 98% 97%   Weight:   108.9 kg   Height:        Intake/Output from previous day:  Intake/Output Summary (Last 24 hours) at 04/15/2024 1043 Last data filed at 04/15/2024 0835 Gross per 24 hour  Intake 720 ml  Output 2700 ml  Net -1980 ml    Physical Exam:  No distress Lungs clear Apical MR murmur Abdomen benign Plus one bilateral edema improved   Lab Results: Basic Metabolic Panel: Recent Labs    04/14/24 0428 04/15/24 0406  NA 136 138  K 3.7 3.7  CL 99 100  CO2 27 27  GLUCOSE 93 90  BUN 21 22  CREATININE 1.21 1.21  CALCIUM  9.3 8.7*  MG 2.2 2.3    CBC: Recent Labs    04/14/24 0428 04/15/24 0406  WBC 5.6 4.5  NEUTROABS 2.9 2.1  HGB 15.1 15.5  HCT 46.6 47.6  MCV 85.7 86.1  PLT 174 163     Imaging: No results found.  Cardiac Studies:  ECG: afib nonspecific ST changes QRS 100 msec   Telemetry: afib 100 -130 bpm  Echo: 11/30/23 EF 25-30% moderate MR   Medications:    carvedilol   12.5 mg Oral BID WC   cefadroxil  1,000 mg Oral BID   digoxin   0.25 mg Oral Daily   empagliflozin   10 mg Oral Daily   furosemide   40 mg Oral BID   guaiFENesin   1,200 mg Oral BID   losartan   25 mg Oral Daily   potassium chloride  SA  20 mEq Oral Daily   rivaroxaban   20 mg Oral Q supper   rosuvastatin   20 mg Oral Daily        Assessment/Plan:   CHF:  chronic systolic CHF. Without appropriate meds during 22 day incarceration. BP low hold lasix  today resume in am 20 mg daily Continue losartan  and digoxin  K 3.7 Cr 1.2 Afib:  Increase coreg  to 12.5 mg bid and add digoxin  for better rate control  l Has had LAA clip on Xarelto  AVR:  normal function on last TTE June this year normal exam AICD:  being considered for outpatient f/u with Dr Inocencio. QRS narrow not a candidate for CRT   Caleb [name] 04/15/2024, 10:43 AM

## 2024-04-15 NOTE — Progress Notes (Signed)
   04/15/24 1313  TOC Brief Assessment  Insurance and Status Reviewed  Patient has primary care physician Yes  Home environment has been reviewed from home with self  Prior level of function: independent  Prior/Current Home Services No current home services  Social Drivers of Health Review SDOH reviewed no interventions necessary  Readmission risk has been reviewed Yes  Transition of care needs no transition of care needs at this time

## 2024-04-15 NOTE — Progress Notes (Signed)
 Heart Failure Nurse Navigator Progress Note    Called and spoke with patient about scheduleing a HF TOC appointment. He reported that as soon as he gets discharged from the hospital , he is going back to [facility] [employer] for work, and he might be back in about 45 days. He didn't want to schedule a appointment yet, because of not knowing his exact schedule. He was given the HF [facility] clinic phone number to call and schedule when he returns from [place].   Navigator will sign off at this time.   Stephane Haddock, BSN, Scientist, Clinical (histocompatibility And Immunogenetics) Only

## 2024-04-16 ENCOUNTER — Telehealth: Payer: Self-pay | Admitting: Physician Assistant

## 2024-04-16 DIAGNOSIS — I480 Paroxysmal atrial fibrillation: Secondary | ICD-10-CM

## 2024-04-16 DIAGNOSIS — I428 Other cardiomyopathies: Secondary | ICD-10-CM | POA: Diagnosis not present

## 2024-04-16 DIAGNOSIS — I5023 Acute on chronic systolic (congestive) heart failure: Secondary | ICD-10-CM | POA: Diagnosis not present

## 2024-04-16 DIAGNOSIS — J189 Pneumonia, unspecified organism: Secondary | ICD-10-CM | POA: Diagnosis not present

## 2024-04-16 MED ORDER — IPRATROPIUM-ALBUTEROL 0.5-2.5 (3) MG/3ML IN SOLN
3.0000 mL | RESPIRATORY_TRACT | Status: DC | PRN
Start: 2024-04-16 — End: 2024-04-16

## 2024-04-16 NOTE — Progress Notes (Signed)
 Dr. Sebastian made aware of patient leaving AMA. IV removed by NT prior to patient leaving the department.

## 2024-04-16 NOTE — Progress Notes (Signed)
 Assisting primary RN and accessing chart to document patient decision to leave against medical orders. Discussed risks associated with leaving against medical orders and before discharged. Patient verbalized understanding. Advised patient to return to nearest ED or urgent care if his symptoms return or worsen. Verbalized understanding
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
## 2024-04-16 NOTE — Telephone Encounter (Signed)
   Transition of Care Follow-up Phone Call Request    Patient Name: NIGUEL MOURE Date of Birth: 05/24/1961 Date of Encounter: 04/16/2024  Primary Care Provider:  Paseda, Folashade R, FNP Primary Cardiologist:  Jerel Balding, MD  Ollis CHRISTELLA Spangle has been scheduled for a transition of care follow up appointment with a [facility] provider:  Lum Louis NP  Please reach out to Ollis CHRISTELLA Spangle within 48 hours of discharge to confirm appointment and review transition of care protocol questionnaire. Anticipated discharge date: 11/8  [place], [place]  04/16/2024, 9:59 AM

## 2024-04-16 NOTE — Progress Notes (Signed)
 Cardiologist:  Croitoru and Camnitz  Subjective:   Pt says his breathing is good  I've got to get home today   I have something to do  Objective:  Vitals:   04/15/24 2009 04/15/24 2050 04/16/24 0454 04/16/24 0500  BP: 96/75  114/81   Pulse: 86  85   Resp: 18  14   Temp: 97.9 F (36.6 C)  (!) 97.5 F (36.4 C)   TempSrc: Oral  Oral   SpO2: 94% 93% 98%   Weight:    106.9 kg  Height:        Intake/Output from previous day:  Intake/Output Summary (Last 24 hours) at 04/16/2024 0719 Last data filed at 04/16/2024 0533 Gross per 24 hour  Intake 720 ml  Output 1725 ml  Net -1005 ml   Net neg 6.5 L  Physical Exam:  Pt in NAD  JVP mildly increaed  Lungs clear RRR   II/VI sysotlic murmur apex   Abdomen benign Tr LE edema   Lab Results: Basic Metabolic Panel: Recent Labs    04/14/24 0428 04/15/24 0406  NA 136 138  K 3.7 3.7  CL 99 100  CO2 27 27  GLUCOSE 93 90  BUN 21 22  CREATININE 1.21 1.21  CALCIUM  9.3 8.7*  MG 2.2 2.3    CBC: Recent Labs    04/14/24 0428 04/15/24 0406  WBC 5.6 4.5  NEUTROABS 2.9 2.1  HGB 15.1 15.5  HCT 46.6 47.6  MCV 85.7 86.1  PLT 174 163     Imaging: No results found.  Tele  Afib  80s     Cardiac Studies:  Echo   June 2025     1. Left ventricular ejection fraction, by estimation, is 25 to 30%. The  left ventricle has severely decreased function. Left ventricular  endocardial border not optimally defined to evaluate regional wall motion.  The left ventricular internal cavity size   was moderately dilated. There is moderate concentric left ventricular  hypertrophy. Left ventricular diastolic parameters are indeterminate.   2. Right ventricular systolic function is mildly reduced. The right  ventricular size is not well visualized.   3. Left atrial size was severely dilated.   4. Right atrial size was severely dilated.   5. The mitral valve is grossly normal. Moderate mitral valve  regurgitation.   6. DVI 0.40. The  aortic valve has been repaired/replaced. Aortic valve  regurgitation is not visualized. Aortic valve mean gradient measures 22.0  mmHg.   Medications:    carvedilol   12.5 mg Oral BID WC   cefadroxil  1,000 mg Oral BID   digoxin   0.25 mg Oral Daily   empagliflozin   10 mg Oral Daily   furosemide   20 mg Oral Daily   guaiFENesin   1,200 mg Oral BID   ipratropium-albuterol   3 mL Nebulization TID   losartan   25 mg Oral Daily   potassium chloride  SA  20 mEq Oral Daily   rivaroxaban   20 mg Oral Q supper   rosuvastatin   20 mg Oral Daily        Assessment/Plan:   1  HFrEF  Pt with knwn HFrEF  Had been off of meds for 22 days     Diuresed signficiantly     Still with some volume overload   Mild   Keep on losartan , carvedilol , farxiga and digoxin  On lasix  20 mg daily today   2 [employer]  Now on 12.5 bid carvedilol  and digoxin   Rates good  S/p LAA clipping   has continued on Xarelto     3   Hx Thoracid aneurysm.  S/p A   s/p Bentall procedure with AVR in 2019   Normal valve function on TTE in June 2025   From cardiac standpoint he could go home   Very anxious about this   Needs to get to [facility]  But I told him he needs clinic appt this week    Will get in [facility]  May need to back down on digoxin  Get labs at that time   Vina Gull 04/16/2024, 7:19 AM

## 2024-04-18 LAB — CULTURE, BLOOD (ROUTINE X 2)
Culture: NO GROWTH
Culture: NO GROWTH

## 2024-04-18 NOTE — Telephone Encounter (Signed)
 2nd attempt to reach patient with no answer. Left message for a call back.

## 2024-04-19 NOTE — Telephone Encounter (Signed)
 3rd attempt: Spoke with sister of patient and she requested a call back around 3:00 pm on 04/19/2024.

## 2024-04-19 NOTE — Telephone Encounter (Signed)
 Patient contacted by clinical team regarding discharge from hospital on 04/16/2024.  Patient understands to follow up with provider [place] on 04/20/2024 at 2:45 pm at [facility] office. Patient understands discharge instructions?Yes Patient understands medications and how to take them? Yes Patient understands to bring all medications to this visit? Yes  You can review AVS Prior to calling to determine if patient had orders for Home Health or Medical Equipment (DME). Patient reports appropriate help at home with ADL's and has DME at this time. No home health ordered Patient reports they have been contacted by home health if ordered.  Not at this time   You can see if the patient is active in MyChart next to their picture.  If they are please remind them that they can do early check in for their visit a day or two before to make the day of their visit run as smooth as possible.  If they need help with MyChart you can refer them to their AVS for user name and password and give them the phone number [phone] to call for help.     Postop: Structural/Cath/Device/(other cardiac invasive procedures) patients:   What is your wound status? Any signs/ symptoms of infection (Temp, redness/ red streaks, swelling, purulent drainage, foul odor or smell)?   Please do not place any creams/ lotions/ or antibiotic ointment on any surgical incisions/ wounds without physician approval.

## 2024-04-20 ENCOUNTER — Ambulatory Visit: Payer: PRIVATE HEALTH INSURANCE | Admitting: Emergency Medicine
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
## 2024-06-13 ENCOUNTER — Telehealth: Payer: Self-pay | Admitting: Pharmacy Technician

## 2024-06-13 NOTE — Telephone Encounter (Signed)
" ° °  Xarelto  approval 06/09/24 for 1 year through [employer]  "

## 2024-07-12 ENCOUNTER — Other Ambulatory Visit: Payer: Self-pay | Admitting: Cardiovascular Disease

## 2024-07-12 ENCOUNTER — Other Ambulatory Visit: Payer: Self-pay

## 2024-07-12 MED ORDER — TORSEMIDE 20 MG PO TABS
20.0000 mg | ORAL_TABLET | Freq: Every day | ORAL | 3 refills | Status: AC
  Filled 2024-07-12: qty 90, 90d supply, fill #0

## 2024-07-12 MED ORDER — ROSUVASTATIN CALCIUM 20 MG PO TABS
20.0000 mg | ORAL_TABLET | Freq: Every day | ORAL | 3 refills | Status: AC
  Filled 2024-07-12: qty 90, 90d supply, fill #0

## 2024-07-12 MED ORDER — METOPROLOL SUCCINATE ER 50 MG PO TB24
50.0000 mg | ORAL_TABLET | Freq: Every day | ORAL | 3 refills | Status: AC
  Filled 2024-07-12: qty 90, 90d supply, fill #0

## 2024-07-12 MED ORDER — RIVAROXABAN 20 MG PO TABS
20.0000 mg | ORAL_TABLET | Freq: Every day | ORAL | 5 refills | Status: AC
  Filled 2024-07-12: qty 30, 30d supply, fill #0

## 2024-07-12 MED ORDER — POTASSIUM CHLORIDE CRYS ER 20 MEQ PO TBCR
20.0000 meq | EXTENDED_RELEASE_TABLET | Freq: Every day | ORAL | 3 refills | Status: AC
  Filled 2024-07-12: qty 90, 90d supply, fill #0

## 2024-07-12 MED ORDER — ASPIRIN 81 MG PO CHEW
81.0000 mg | CHEWABLE_TABLET | Freq: Every day | ORAL | 11 refills | Status: AC
  Filled 2024-07-12: qty 30, 30d supply, fill #0

## 2024-07-12 NOTE — Telephone Encounter (Signed)
 Prescription refill request for Xarelto  received.  Indication: a fib Last office visit: 03/17/24 Weight: 235 Age: 64 Scr: 1.21 epic 04/15/24 CrCl: 93 ml/min
